# Patient Record
Sex: Female | Born: 1960 | Race: Black or African American | Hispanic: No | Marital: Single | Smoking: Never smoker
Health system: Southern US, Community
[De-identification: ages and names within clinical notes are randomized; demographics above are authoritative.]

## PROBLEM LIST (undated history)

## (undated) DIAGNOSIS — IMO0001 Reserved for inherently not codable concepts without codable children: Secondary | ICD-10-CM

## (undated) DIAGNOSIS — K219 Gastro-esophageal reflux disease without esophagitis: Secondary | ICD-10-CM

## (undated) DIAGNOSIS — D509 Iron deficiency anemia, unspecified: Secondary | ICD-10-CM

## (undated) DIAGNOSIS — Z8551 Personal history of malignant neoplasm of bladder: Secondary | ICD-10-CM

## (undated) DIAGNOSIS — K635 Polyp of colon: Secondary | ICD-10-CM

## (undated) DIAGNOSIS — R7303 Prediabetes: Secondary | ICD-10-CM

## (undated) DIAGNOSIS — M1990 Unspecified osteoarthritis, unspecified site: Secondary | ICD-10-CM

## (undated) DIAGNOSIS — E7800 Pure hypercholesterolemia, unspecified: Secondary | ICD-10-CM

## (undated) DIAGNOSIS — Z972 Presence of dental prosthetic device (complete) (partial): Secondary | ICD-10-CM

## (undated) DIAGNOSIS — E66811 Obesity, class 1: Secondary | ICD-10-CM

## (undated) DIAGNOSIS — R5383 Other fatigue: Secondary | ICD-10-CM

## (undated) DIAGNOSIS — C50919 Malignant neoplasm of unspecified site of unspecified female breast: Secondary | ICD-10-CM

## (undated) DIAGNOSIS — R5381 Other malaise: Secondary | ICD-10-CM

## (undated) DIAGNOSIS — R945 Abnormal results of liver function studies: Secondary | ICD-10-CM

## (undated) DIAGNOSIS — E876 Hypokalemia: Secondary | ICD-10-CM

## (undated) DIAGNOSIS — F1011 Alcohol abuse, in remission: Secondary | ICD-10-CM

## (undated) DIAGNOSIS — M5430 Sciatica, unspecified side: Secondary | ICD-10-CM

## (undated) DIAGNOSIS — C801 Malignant (primary) neoplasm, unspecified: Secondary | ICD-10-CM

## (undated) DIAGNOSIS — I10 Essential (primary) hypertension: Secondary | ICD-10-CM

## (undated) DIAGNOSIS — M109 Gout, unspecified: Secondary | ICD-10-CM

## (undated) HISTORY — DX: Essential (primary) hypertension: I10

## (undated) HISTORY — DX: Hypokalemia: E87.6

## (undated) HISTORY — DX: Abnormal results of liver function studies: R94.5

## (undated) HISTORY — PX: VAGINAL HYSTERECTOMY: SUR661

## (undated) HISTORY — DX: Other fatigue: R53.83

## (undated) HISTORY — DX: Other malaise: R53.81

## (undated) HISTORY — DX: Pure hypercholesterolemia, unspecified: E78.00

## (undated) HISTORY — DX: Hypercalcemia: E83.52

## (undated) HISTORY — PX: ENDOMETRIAL ABLATION: SHX621

## (undated) HISTORY — DX: Iron deficiency anemia, unspecified: D50.9

## (undated) HISTORY — DX: Gout, unspecified: M10.9

---

## 2003-07-21 ENCOUNTER — Other Ambulatory Visit: Payer: Self-pay

## 2003-08-24 DIAGNOSIS — D509 Iron deficiency anemia, unspecified: Secondary | ICD-10-CM

## 2003-08-24 DIAGNOSIS — I10 Essential (primary) hypertension: Secondary | ICD-10-CM

## 2003-08-24 HISTORY — DX: Essential (primary) hypertension: I10

## 2003-08-24 HISTORY — DX: Iron deficiency anemia, unspecified: D50.9

## 2005-11-16 ENCOUNTER — Ambulatory Visit: Payer: Self-pay | Admitting: Obstetrics and Gynecology

## 2008-02-05 ENCOUNTER — Other Ambulatory Visit: Payer: Self-pay

## 2008-02-05 ENCOUNTER — Ambulatory Visit: Payer: Self-pay | Admitting: Unknown Physician Specialty

## 2008-02-22 ENCOUNTER — Inpatient Hospital Stay: Payer: Self-pay | Admitting: Unknown Physician Specialty

## 2008-02-24 ENCOUNTER — Other Ambulatory Visit: Payer: Self-pay

## 2009-04-29 DIAGNOSIS — E7800 Pure hypercholesterolemia, unspecified: Secondary | ICD-10-CM

## 2009-04-29 HISTORY — DX: Pure hypercholesterolemia, unspecified: E78.00

## 2011-01-21 ENCOUNTER — Ambulatory Visit: Payer: Self-pay | Admitting: General Practice

## 2011-02-01 ENCOUNTER — Ambulatory Visit: Payer: Self-pay | Admitting: Urology

## 2014-11-26 ENCOUNTER — Ambulatory Visit
Admit: 2014-11-26 | Disposition: A | Discharge status: Home / Self Care | Payer: Self-pay | Attending: Gastroenterology | Admitting: Gastroenterology

## 2014-11-26 ENCOUNTER — Observation Stay
Admit: 2014-11-26 | Disposition: A | Discharge status: Home / Self Care | Payer: Self-pay | Attending: Gastroenterology | Admitting: Gastroenterology

## 2014-11-26 LAB — CBC WITH DIFFERENTIAL/PLATELET
Basophil #: 0 10*3/uL (ref 0.0–0.1)
Basophil %: 0.8 %
Eosinophil #: 0.2 10*3/uL (ref 0.0–0.7)
Eosinophil %: 2.9 %
HCT: 37 % (ref 35.0–47.0)
HGB: 12.2 g/dL (ref 12.0–16.0)
LYMPHS PCT: 32.7 %
Lymphocyte #: 1.9 10*3/uL (ref 1.0–3.6)
MCH: 29.5 pg (ref 26.0–34.0)
MCHC: 33 g/dL (ref 32.0–36.0)
MCV: 90 fL (ref 80–100)
Monocyte #: 0.4 x10 3/mm (ref 0.2–0.9)
Monocyte %: 7 %
NEUTROS ABS: 3.3 10*3/uL (ref 1.4–6.5)
NEUTROS PCT: 56.6 %
Platelet: 290 10*3/uL (ref 150–440)
RBC: 4.14 10*6/uL (ref 3.80–5.20)
RDW: 13.4 % (ref 11.5–14.5)
WBC: 5.9 10*3/uL (ref 3.6–11.0)

## 2014-11-26 LAB — BASIC METABOLIC PANEL
Anion Gap: 7 (ref 7–16)
BUN: 13 mg/dL
CHLORIDE: 105 mmol/L
CO2: 29 mmol/L
CREATININE: 0.75 mg/dL
Calcium, Total: 9.3 mg/dL
EGFR (African American): >60
EGFR (Non-African Amer.): >60
GLUCOSE: 99 mg/dL
Potassium: 3.5 mmol/L
Sodium: 141 mmol/L

## 2014-11-26 LAB — PROTIME-INR
INR: 1.1
PROTHROMBIN TIME: 14.3 s

## 2014-11-26 LAB — APTT: Activated PTT: 30.1 secs (ref 23.6–35.9)

## 2014-11-27 LAB — BASIC METABOLIC PANEL
Anion Gap: 10 (ref 7–16)
BUN: 9 mg/dL
CO2: 27 mmol/L
Calcium, Total: 9.5 mg/dL
Chloride: 105 mmol/L
Creatinine: 0.75 mg/dL
EGFR (African American): >60
EGFR (Non-African Amer.): >60
GLUCOSE: 100 mg/dL — AB
Potassium: 3 mmol/L — ABNORMAL LOW
Sodium: 142 mmol/L

## 2014-11-27 LAB — CBC WITH DIFFERENTIAL/PLATELET
Basophil #: 0 10*3/uL (ref 0.0–0.1)
Basophil %: 0.4 %
EOS ABS: 0.1 10*3/uL (ref 0.0–0.7)
Eosinophil %: 1.3 %
HCT: 34 % — ABNORMAL LOW (ref 35.0–47.0)
HGB: 11.3 g/dL — ABNORMAL LOW (ref 12.0–16.0)
Lymphocyte #: 1.6 10*3/uL (ref 1.0–3.6)
Lymphocyte %: 20.5 %
MCH: 29.5 pg (ref 26.0–34.0)
MCHC: 33.3 g/dL (ref 32.0–36.0)
MCV: 89 fL (ref 80–100)
MONO ABS: 0.5 x10 3/mm (ref 0.2–0.9)
Monocyte %: 6.4 %
NEUTROS ABS: 5.4 10*3/uL (ref 1.4–6.5)
Neutrophil %: 71.4 %
Platelet: 252 10*3/uL (ref 150–440)
RBC: 3.84 10*6/uL (ref 3.80–5.20)
RDW: 13.2 % (ref 11.5–14.5)
WBC: 7.6 10*3/uL (ref 3.6–11.0)

## 2014-12-16 LAB — SURGICAL PATHOLOGY

## 2014-12-22 NOTE — Consult Note (Signed)
Chief Complaint:  Subjective/Chief Complaint Pt has no further bleeding or abdominal pain.  c/o sore scratchy throat.   VITAL SIGNS/ANCILLARY NOTES: **Vital Signs.:   06-Apr-16 08:00  Vital Signs Type Q 8hr  Celsius 36.7  Temperature Source oral  Pulse Pulse 80  Respirations Respirations 20  Systolic BP Systolic BP 891  Diastolic BP (mmHg) Diastolic BP (mmHg) 87  Mean BP 102  Pulse Ox % Pulse Ox % 94  Pulse Ox Activity Level  At rest  Oxygen Delivery Room Air/ 21 %   Brief Assessment:  GEN well developed, well nourished, no acute distress, A/Ox3, Friend at bedside   Cardiac Regular   Respiratory normal resp effort   Gastrointestinal Normal   Gastrointestinal details normal Soft  Nontender  Nondistended  No masses palpable  Bowel sounds normal  No rebound tenderness  No gaurding  No rigidity  No organomegaly   EXTR negative cyanosis/clubbing, negative edema   Additional Physical Exam Skin: warm, dry, intact   Lab Results:  Routine Chem:  06-Apr-16 05:48   Glucose, Serum  100 (65-99 NOTE: New Reference Range  10/29/14)  BUN 9 (6-20 NOTE: New Reference Range  10/29/14)  Creatinine (comp) 0.75 (0.44-1.00 NOTE: New Reference Range  10/29/14)  Potassium, Serum  3.0 (3.5-5.1 NOTE: New Reference Range  10/29/14)  Chloride, Serum 105 (101-111 NOTE: New Reference Range  10/29/14)  CO2, Serum 27 (22-32 NOTE: New Reference Range  10/29/14)  Calcium (Total), Serum 9.5 (8.9-10.3 NOTE: New Reference Range  10/29/14)  Anion Gap 10  eGFR (African American) >60  eGFR (Non-African American) >60 (eGFR values <47m/min/1.73 m2 may be an indication of chronic kidney disease (CKD). Calculated eGFR is useful in patients with stable renal function. The eGFR calculation will not be reliable in acutely ill patients when serum creatinine is changing rapidly. It is not useful in patients on dialysis. The eGFR calculation may not be applicable to patients at the low and high  extremes of body sizes, pregnant women, and vegetarians.)  Routine Hem:  06-Apr-16 05:48   WBC (CBC) 7.6  RBC (CBC) 3.84  Hemoglobin (CBC)  11.3  Hematocrit (CBC)  34.0  Platelet Count (CBC) 252  MCV 89  MCH 29.5  MCHC 33.3  RDW 13.2  Neutrophil % 71.4  Lymphocyte % 20.5  Monocyte % 6.4  Eosinophil % 1.3  Basophil % 0.4  Neutrophil # 5.4  Lymphocyte # 1.6  Monocyte # 0.5  Eosinophil # 0.1  Basophil # 0.0 (Result(s) reported on 27 Nov 2014 at 06:33AM.)   Assessment/Plan:  Assessment/Plan:  Assessment Postpolypectomy Bleed:  Resolved s/p clip placement. Anemia: Stable/.  Secondary to GI bleed.   Plan 1) Chloraseptic as needed 2) Colace 1032mQD or BID 3) clear liquids today 4) low residue diet for 4 days 5) call if further bleeding Please call if you have any questions or concerns   Electronic Signatures: JoAndria MeuseNP)  (Signed 06-Apr-16 14:09)  Authored: Chief Complaint, VITAL SIGNS/ANCILLARY NOTES, Brief Assessment, Lab Results, Assessment/Plan   Last Updated: 06-Apr-16 14:09 by JoAndria MeuseNP)

## 2014-12-22 NOTE — H&P (Signed)
See alternate H&P dictated for this date  Maksym Pfiffner Fawn Kirk MD ELECTRONICALLY SIGNED 12/07/2014 20:35

## 2014-12-22 NOTE — H&P (Signed)
PATIENT NAME:  Christina, Holloway MR#:  [id number] DATE OF BIRTH:  [birth year]  DATE OF ADMISSION:  11/26/2014  ADDENDUM  DATE OF BIRTH: 02/28/1961.   CHIEF COMPLAINT: Gastrointestinal bleed.   HISTORY OF PRESENT ILLNESS: This is a 54 year old female who, earlier on the day of admission had a colonoscopy done and had a large polyp removed by Dr. Allen Norris. Dr. Allen Norris states that there was no bleeding during the initial procedure. The patient went home, was doing well. The patient returned later with significant multiple bloody bowel movements. The patient admits that she did not adhere strictly to a clear liquid diet. Dr. Allen Norris happened to be present doing some training, and so he elected to take the patient back to the endoscopy lab. He was able to find the site of the bleed and achieved good hemostasis with multiple clips. He then contacted the hospitalists for admission for observation to make sure the patient did not re-bleed and that her hemoglobin was stable.   PRIMARY CARE PHYSICIAN: Margarita Rana, MD.   PAST MEDICAL HISTORY: Hypertension.   CURRENT MEDICATIONS: Hydrochlorothiazide 12.5 mg daily, atenolol 25 mg daily.   PAST SURGICAL HISTORY: Total hysterectomy and a recent colonoscopy as mentioned in history of present illness.   ALLERGIES: PENICILLIN CAUSES HIVES.   FAMILY HISTORY: Includes hypertension.   SOCIAL HISTORY: The patient is not a smoker. Denies alcohol and denies illicit drug use.   REVIEW OF SYSTEMS  CONSTITUTIONAL: Denies fever, fatigue, or weakness.  EYES: Denies double or blurred vision, redness or pain.  EAR, NOSE, AND THROAT: Denies hearing loss, difficulty swallowing, ear pain.  RESPIRATORY: Denies dyspnea, painful breathing, chronic obstructive pulmonary disease.  CARDIOVASCULAR: Denies orthopnea, chest pain and palpitations.  GASTROINTESTINAL: Endorses bloody bowel movements. Denies nausea or vomiting. Denies constipation.  GENITOURINARY: Denies hematuria, frequency, or  incontinence.  ENDOCRINE: Denies nocturia, thyroid problems, thirst.  HEMATOLOGIC AND LYMPHATIC: Denies easy bruising, bleeding, swollen glands.  INTEGUMENTARY: Denies acne, rash or lesions.  MUSCULOSKELETAL: Denies acute arthritis, joint swelling, or gout.  NEUROLOGICAL: Denies weakness, dysphagia, headache.  PSYCHIATRIC: Denies anxiety, insomnia, or depression.   PHYSICAL EXAMINATION:  VITAL SIGNS: Blood pressure 161/90 pulse 79, respirations 20 with 95% O2 sats on room air. Temperature 98.1.  GENERAL: This is an obese female, who is sitting up in bed postprocedure in no apparent distress.  HEENT: Pupils equal, round and reactive to light and accommodation. Extraocular movements intact. No scleral icterus. No hearing loss. Moist mucosal membranes.  NECK: Thyroid is not enlarged. Neck is supple. No masses, nontender. No cervical adenopathy. No JVD noted.  RESPIRATORY: Lungs are clear to auscultation bilaterally with no rales, rhonchi or wheezes and no respiratory distress.  CARDIOVASCULAR: Shows a regular rate and rhythm with no murmurs, rubs, or gallops. She has good pedal pulses with no lower extremity edema.  ABDOMEN: Soft, mild tenderness in the epigastric area, nondistended with good bowel sounds.  MUSCULOSKELETAL: Muscular strength 5/5 signs all 4 extremities. Full spontaneous range of motion throughout. No cyanosis or clubbing.  SKIN: No rash or lesions. Skin is warm, dry, and intact.  LYMPHATIC: No adenopathy.  NEUROLOGICAL: Cranial nerves are intact. Sensation intact throughout. No dysarthria or aphasia.  PSYCHIATRIC: Alert and oriented x3. Cooperative with good insight.   LABORATORY DATA: White count 5.9, hemoglobin 12.2, hematocrit 37.0, platelets 290,000, sodium 141, potassium 3.5, chloride 105, bicarbonate 29, BUN 13, creatinine 0.75, glucose 99, calcium is 9.3, INR 1.1.   RADIOLOGY: Upright KUB shows no gross evidence of free air.  ASSESSMENT AND PLAN:  1. Gastrointestinal  bleed. This is a problem that occurred most likely due to irritation of the polyp resection site. Per Dr. Verl Blalock, was a large polyp removal for endoscopy and not adhering to a clear liquid diet very likely irritated the site and precipitated the bleed. Good hemostasis was achieved on repeat endoscopy with clipping per Dr. Verl Blalock. We will admit the patient observation and monitor overnight, recheck her hemoglobin in the morning, make sure she has no further bleeding with gastroenterology following along. We will place her on pantoprazole. 2. Hypertension. This is a chronic problem the patient has. Her blood pressure is a little bit elevated tonight. We will restart her home medications and see how her blood pressure does with that regimen. We can use intravenous p.r.n. if we need to or increase her dose of her home meds as needed to control her blood pressure. 3. Deep vein thrombosis prophylaxis with sequential compression devices only, as the patient just had a gastrointestinal bleed.   CODE STATUS: This patient is full code.   TIME SPENT ON THIS ADMISSION: 50 minutes.    ____________________________ Wilford Corner. Jannifer Franklin, MD dfw:AT D: 11/27/2014 12:45:40 ET T: 11/27/2014 12:57:27 ET JOB#: [id number]  cc: Wilford Corner. Jannifer Franklin, MD, <Dictator> Liesel Peckenpaugh Fawn Kirk MD ELECTRONICALLY SIGNED 11/27/2014 23:40

## 2014-12-22 NOTE — Discharge Summary (Signed)
PATIENT NAME:  Christina Holloway, Christina Holloway MR#:  [id number] DATE OF BIRTH:  25-Apr-1961  DATE OF ADMISSION:  11/26/2014 DATE OF DISCHARGE:  11/27/2014  ADMITTING DIAGNOSES: Gastrointestinal bleed.   DISCHARGE DIAGNOSES:  1.  Hematochezia after colonoscopy and polyp removal on 11/26/2014 by Dr. Allen Norris.  2.  Acute posthemorrhagic anemia.  3.  Hyperglycemia.  4.  History of hypertension and alcohol abuse.   DISCHARGE CONDITION: Stable.   DISCHARGE MEDICATIONS: The patient is to continue atenolol 25 mg daily, HCTZ 12.5 mg p.o. daily, new medication omeprazole 40 mg p.o. daily.   HOME OXYGEN: None.   DIET: Two gram salt, Ensure 3 times daily, nectar thick liquids.   ACTIVITY LIMITATIONS: As tolerated.   FOLLOWUP APPOINTMENTS: With Dr. Allen Norris in 1 week after discharge, Dr. Venia Minks in 2-3 days after discharge.  CONSULTANTS: Care management, social work.   RADIOLOGIC STUDIES: KUB, 11/26/2014, showing technically degraded radiograph. No gross plain film evidence of free air.   HOSPITAL COURSE: The patient is a 54 year old African American female with past medical history significant for history of hypertension, history of alcohol abuse, who presented to the hospital with hematochezia after colonoscopy. Apparently, the patient underwent colonoscopy and had removal of 20 mm polyp in the cecum as well as a 4 mm polyp from ascending colon. After returning back home, she had at least 3 large bowel movements with bright red blood as well as black or tarry looking stool, so she presented to the hospital for further evaluation and underwent repeated colonoscopy on 11/26/2014 by Dr. Allen Norris who saw blood rectum as well as sigmoid colon and descending colon as well as the splenic flexure and in transverse colon. He placed clips and the patient was admitted for observation. The patient was initiated on clear liquid diet. Her bleeding actually resolved. She was advised to continue to full liquid diet while she was at home for the  next 3 or 4 days and advance slowly to soft diet. She is to follow up with Dr. Allen Norris in the next few days after discharge. The patient's hemoglobin level was followed while she was in the hospital and she was noted to have hemoglobin level drifting down from 12.2 on the day of admission to 11.3 on the day of discharge. Since the patient had no more recurrent bleeding and her platelet count remained stable as well as her pro time and INR were normal, it was felt that the patient would be okay to be discharged. Her vital signs were stable on the day of discharge, temperature 98.2, pulse was 80s, respirations were 20, blood pressure 133/87, saturation was 94%-95% on room air at rest. The patient was also noted to have hypokalemia with a potassium level of 3.0 on 11/27/2014; she was supplemented potassium. It is recommended to follow the patient's hemoglobin level as outpatient and make decisions about iron supplementation if needed. For now, the patient is to continue omeprazole to decrease the risks of rebleeding.   TIME SPENT: 40 minutes.     ____________________________ Theodoro Grist, MD rv:bm D: 11/27/2014 16:49:00 ET T: 11/28/2014 00:27:19 ET JOB#: [id number]  cc: Theodoro Grist, MD, <Dictator> Jerrell Belfast, MD Lucilla Lame, MD [place] MD ELECTRONICALLY SIGNED 12/01/2014 16:15

## 2015-02-20 ENCOUNTER — Telehealth: Payer: Self-pay

## 2015-02-27 NOTE — Telephone Encounter (Signed)
LVM for pt to return my call to schedule 4 month colonoscopy repeat.

## 2015-05-02 ENCOUNTER — Other Ambulatory Visit: Payer: Self-pay

## 2015-05-02 DIAGNOSIS — M109 Gout, unspecified: Secondary | ICD-10-CM

## 2015-05-02 DIAGNOSIS — D649 Anemia, unspecified: Secondary | ICD-10-CM | POA: Insufficient documentation

## 2015-05-02 DIAGNOSIS — R5383 Other fatigue: Secondary | ICD-10-CM

## 2015-05-02 DIAGNOSIS — R7989 Other specified abnormal findings of blood chemistry: Secondary | ICD-10-CM | POA: Insufficient documentation

## 2015-05-02 DIAGNOSIS — E876 Hypokalemia: Secondary | ICD-10-CM

## 2015-05-02 DIAGNOSIS — R945 Abnormal results of liver function studies: Secondary | ICD-10-CM | POA: Insufficient documentation

## 2015-05-02 DIAGNOSIS — R5381 Other malaise: Secondary | ICD-10-CM | POA: Insufficient documentation

## 2015-05-02 HISTORY — DX: Gout, unspecified: M10.9

## 2015-05-02 HISTORY — DX: Other specified abnormal findings of blood chemistry: R79.89

## 2015-05-02 HISTORY — DX: Other malaise: R53.81

## 2015-05-02 HISTORY — DX: Hypercalcemia: E83.52

## 2015-05-02 HISTORY — DX: Hypokalemia: E87.6

## 2015-05-02 HISTORY — DX: Other malaise: R53.83

## 2015-05-02 HISTORY — DX: Abnormal results of liver function studies: R94.5

## 2015-05-05 ENCOUNTER — Ambulatory Visit: Payer: Self-pay | Admitting: Gastroenterology

## 2015-05-05 ENCOUNTER — Encounter (INDEPENDENT_AMBULATORY_CARE_PROVIDER_SITE_OTHER): Payer: Self-pay

## 2015-05-05 ENCOUNTER — Ambulatory Visit (INDEPENDENT_AMBULATORY_CARE_PROVIDER_SITE_OTHER): Payer: PRIVATE HEALTH INSURANCE | Admitting: Gastroenterology

## 2015-05-05 ENCOUNTER — Encounter: Payer: Self-pay | Admitting: *Deleted

## 2015-05-05 ENCOUNTER — Other Ambulatory Visit: Payer: Self-pay

## 2015-05-05 VITALS — BP 146/91 | HR 59 | Temp 98.6°F | Ht 67.0 in | Wt 236.0 lb

## 2015-05-05 DIAGNOSIS — R1013 Epigastric pain: Secondary | ICD-10-CM

## 2015-05-05 DIAGNOSIS — G8929 Other chronic pain: Secondary | ICD-10-CM

## 2015-05-05 DIAGNOSIS — Z86010 Personal history of colonic polyps: Secondary | ICD-10-CM

## 2015-05-05 MED ORDER — PEG 3350-KCL-NA BICARB-NACL 420 G PO SOLR
4000.0000 mL | ORAL | Status: DC
Start: 2015-05-05 — End: 2017-02-07

## 2015-05-05 NOTE — Progress Notes (Signed)
   Primary Care Physician: Margarita Rana, MD  Primary Gastroenterologist:  Dr. Lucilla Lame  Chief Complaint  Patient presents with  . Abdominal Pain    HPI: Christina Holloway is a 54 y.o. female here who had a large polyp removed from her cecal area with a post-polypectomy bleed. The patient was supposed to have a repeat colonoscopy but has not had one since. She comes in with a report of epigastric pain that lasted 1 day a few weeks ago. The patient has had no repeat in that abdominal pain. The patient also states that she had just finished a large milkshake when she had the pain.  Current Outpatient Prescriptions  Medication Sig Dispense Refill  . atenolol (TENORMIN) 25 MG tablet Take by mouth.    . hydrochlorothiazide (HYDRODIURIL) 12.5 MG tablet Take by mouth.    Marland Kitchen omeprazole (PRILOSEC) 40 MG capsule Take by mouth.     No current facility-administered medications for this visit.    Allergies as of 05/05/2015 - Review Complete 05/05/2015  Allergen Reaction Noted  . Penicillins  05/02/2015    ROS:  General: Negative for anorexia, weight loss, fever, chills, fatigue, weakness. ENT: Negative for hoarseness, difficulty swallowing , nasal congestion. CV: Negative for chest pain, angina, palpitations, dyspnea on exertion, peripheral edema.  Respiratory: Negative for dyspnea at rest, dyspnea on exertion, cough, sputum, wheezing.  GI: See history of present illness. GU:  Negative for dysuria, hematuria, urinary incontinence, urinary frequency, nocturnal urination.  Endo: Negative for unusual weight change.    Physical Examination:   BP 146/91 mmHg  Pulse 59  Temp(Src) 98.6 F (37 C) (Oral)  Ht 5\' 7"  (1.702 m)  Wt 236 lb (107.049 kg)  BMI 36.95 kg/m2  General: Well-nourished, well-developed in no acute distress.  Eyes: No icterus. Conjunctivae pink. Mouth: Oropharyngeal mucosa moist and pink , no lesions erythema or exudate. Lungs: Clear to auscultation bilaterally.  Non-labored. Heart: Regular rate and rhythm, no murmurs rubs or gallops.  Abdomen: Bowel sounds are normal, nontender, nondistended, no hepatosplenomegaly or masses, no abdominal bruits or hernia , no rebound or guarding.   Extremities: No lower extremity edema. No clubbing or deformities. Neuro: Alert and oriented x 3.  Grossly intact. Skin: Warm and dry, no jaundice.   Psych: Alert and cooperative, normal mood and affect.  Labs:    Imaging Studies: No results found.  Assessment and Plan:   Christina Holloway is a 54 y.o. y/o female who has a history of a large polyp in the colon and will be set up for repeat colonoscopy. The patient also has a recent history of epigastric pain that lasted one day. The patient has not had any further episodes of that. The patient has been told that she will be set up for repeat colonoscopy due to her polyp and will have an upper endoscopy if she has any further episodes of the pain. The patient has been explained the plan and agrees with it.   Note: This dictation was prepared with Dragon dictation along with smaller phrase technology. Any transcriptional errors that result from this process are unintentional.

## 2015-05-07 NOTE — Discharge Instructions (Signed)

## 2015-05-08 ENCOUNTER — Ambulatory Visit: Payer: PRIVATE HEALTH INSURANCE | Admitting: Student in an Organized Health Care Education/Training Program

## 2015-05-08 ENCOUNTER — Other Ambulatory Visit: Payer: Self-pay | Admitting: Gastroenterology

## 2015-05-08 ENCOUNTER — Ambulatory Visit
Admission: RE | Admit: 2015-05-08 | Discharge: 2015-05-08 | Disposition: A | Discharge status: Home / Self Care | Payer: PRIVATE HEALTH INSURANCE | Source: Ambulatory Visit | Attending: Gastroenterology | Admitting: Gastroenterology

## 2015-05-08 ENCOUNTER — Encounter
Admission: RE | Disposition: A | Discharge status: Home / Self Care | Payer: Self-pay | Source: Ambulatory Visit | Attending: Gastroenterology

## 2015-05-08 ENCOUNTER — Encounter: Payer: Self-pay | Admitting: *Deleted

## 2015-05-08 DIAGNOSIS — Z860100 Personal history of colon polyps, unspecified: Secondary | ICD-10-CM | POA: Insufficient documentation

## 2015-05-08 DIAGNOSIS — K219 Gastro-esophageal reflux disease without esophagitis: Secondary | ICD-10-CM | POA: Diagnosis not present

## 2015-05-08 DIAGNOSIS — M1990 Unspecified osteoarthritis, unspecified site: Secondary | ICD-10-CM | POA: Insufficient documentation

## 2015-05-08 DIAGNOSIS — Z91010 Allergy to peanuts: Secondary | ICD-10-CM | POA: Diagnosis not present

## 2015-05-08 DIAGNOSIS — E780 Pure hypercholesterolemia: Secondary | ICD-10-CM | POA: Insufficient documentation

## 2015-05-08 DIAGNOSIS — Z09 Encounter for follow-up examination after completed treatment for conditions other than malignant neoplasm: Secondary | ICD-10-CM | POA: Diagnosis not present

## 2015-05-08 DIAGNOSIS — Z79899 Other long term (current) drug therapy: Secondary | ICD-10-CM | POA: Diagnosis not present

## 2015-05-08 DIAGNOSIS — I10 Essential (primary) hypertension: Secondary | ICD-10-CM | POA: Insufficient documentation

## 2015-05-08 HISTORY — DX: Reserved for inherently not codable concepts without codable children: IMO0001

## 2015-05-08 HISTORY — DX: Gastro-esophageal reflux disease without esophagitis: K21.9

## 2015-05-08 HISTORY — PX: COLONOSCOPY WITH PROPOFOL: SHX5780

## 2015-05-08 HISTORY — DX: Presence of dental prosthetic device (complete) (partial): Z97.2

## 2015-05-08 HISTORY — DX: Unspecified osteoarthritis, unspecified site: M19.90

## 2015-05-08 HISTORY — PX: POLYPECTOMY: SHX5525

## 2015-05-08 SURGERY — COLONOSCOPY WITH PROPOFOL
Anesthesia: Monitor Anesthesia Care | Wound class: Contaminated

## 2015-05-08 MED ORDER — PROPOFOL 10 MG/ML IV BOLUS
INTRAVENOUS | Status: DC | PRN
  Administered 2015-05-08: 50 mg via INTRAVENOUS
  Administered 2015-05-08: 150 mg via INTRAVENOUS
  Administered 2015-05-08: 50 mg via INTRAVENOUS
  Administered 2015-05-08: 20 mg via INTRAVENOUS
  Administered 2015-05-08: 50 mg via INTRAVENOUS
  Administered 2015-05-08: 30 mg via INTRAVENOUS

## 2015-05-08 MED ORDER — STERILE WATER FOR IRRIGATION IR SOLN
Status: DC | PRN
  Administered 2015-05-08: 10:00:00

## 2015-05-08 MED ORDER — ONDANSETRON HCL 4 MG/2ML IJ SOLN: 4.0000 mg | Freq: Once | INTRAMUSCULAR | Status: DC | PRN

## 2015-05-08 MED ORDER — LIDOCAINE HCL (CARDIAC) 20 MG/ML IV SOLN
INTRAVENOUS | Status: DC | PRN
  Administered 2015-05-08: 50 mg via INTRAVENOUS

## 2015-05-08 MED ORDER — LACTATED RINGERS IV SOLN
INTRAVENOUS | Status: DC
  Administered 2015-05-08 (×2): via INTRAVENOUS

## 2015-05-08 MED ORDER — ACETAMINOPHEN 160 MG/5ML PO SOLN: 325.0000 mg | ORAL | Status: DC | PRN

## 2015-05-08 MED ORDER — ACETAMINOPHEN 325 MG PO TABS
325.0000 mg | ORAL_TABLET | ORAL | Status: DC | PRN
Start: 2015-05-08 — End: 2015-05-08

## 2015-05-08 SURGICAL SUPPLY — 28 items
CANISTER SUCT 1200ML W/VALVE (MISCELLANEOUS) ×4 IMPLANT
FCP ESCP3.2XJMB 240X2.8X (MISCELLANEOUS)
FORCEPS BIOP RAD 4 LRG CAP 4 (CUTTING FORCEPS) IMPLANT
FORCEPS BIOP RJ4 240 W/NDL (MISCELLANEOUS)
FORCEPS ESCP3.2XJMB 240X2.8X (MISCELLANEOUS) IMPLANT
GOWN CVR UNV OPN BCK APRN NK (MISCELLANEOUS) ×4 IMPLANT
GOWN ISOL THUMB LOOP REG UNIV (MISCELLANEOUS) ×4
HEMOCLIP INSTINCT (CLIP) ×4 IMPLANT
INJECTOR VARIJECT VIN23 (MISCELLANEOUS) IMPLANT
KIT CO2 TUBING (TUBING) IMPLANT
KIT DEFENDO VALVE AND CONN (KITS) IMPLANT
KIT ENDO PROCEDURE OLY (KITS) ×4 IMPLANT
LIGATOR MULTIBAND 6SHOOTER MBL (MISCELLANEOUS) IMPLANT
MARKER SPOT ENDO TATTOO 5ML (MISCELLANEOUS) IMPLANT
PAD GROUND ADULT SPLIT (MISCELLANEOUS) IMPLANT
SNARE SHORT THROW 13M SML OVAL (MISCELLANEOUS) ×4 IMPLANT
SNARE SHORT THROW 30M LRG OVAL (MISCELLANEOUS) IMPLANT
SPOT EX ENDOSCOPIC TATTOO (MISCELLANEOUS)
SUCTION POLY TRAP 4CHAMBER (MISCELLANEOUS) IMPLANT
TRAP SUCTION POLY (MISCELLANEOUS) ×4 IMPLANT
TUBING CONN 6MMX3.1M (TUBING)
TUBING SUCTION CONN 0.25 STRL (TUBING) IMPLANT
UNDERPAD 30X60 958B10 (PK) (MISCELLANEOUS) IMPLANT
VALVE BIOPSY ENDO (VALVE) IMPLANT
VARIJECT INJECTOR VIN23 (MISCELLANEOUS)
WATER AUXILLARY (MISCELLANEOUS) IMPLANT
WATER STERILE IRR 250ML POUR (IV SOLUTION) ×4 IMPLANT
WATER STERILE IRR 500ML POUR (IV SOLUTION) IMPLANT

## 2015-05-08 NOTE — Anesthesia Postprocedure Evaluation (Signed)
  Anesthesia Post-op Note  Patient: Christina Holloway  Procedure(s) Performed: Procedure(s): COLONOSCOPY WITH PROPOFOL (N/A) POLYPECTOMY  Anesthesia type:MAC  Patient location: PACU  Post pain: Pain level controlled  Post assessment: Post-op Vital signs reviewed, Patient's Cardiovascular Status Stable, Respiratory Function Stable, Patent Airway and No signs of Nausea or vomiting  Post vital signs: Reviewed and stable  Last Vitals:  Filed Vitals:   05/08/15 1006  BP: 126/89  Pulse: 80  Temp:   Resp: 18    Level of consciousness: awake, alert  and patient cooperative  Complications: No apparent anesthesia complications

## 2015-05-08 NOTE — Op Note (Signed)
[facility] Gastroenterology Patient Name: Christina Holloway Procedure Date: 05/08/2015 9:35 AM MRN: [id number] Account #: [id number] Date of Birth: 04/18/1961 Admit Type: Outpatient Age: 54 Room: [facility] OR ROOM 01 Gender: Female Note Status: Finalized Procedure:         Colonoscopy Indications:       Follow-up for history of adenomatous polyps in the colon Providers:         Lucilla Lame, MD Referring MD:      Jerrell Belfast, MD (Referring MD) Medicines:         Propofol per Anesthesia Complications:     No immediate complications. Procedure:         Pre-Anesthesia Assessment:                    - Prior to the procedure, a History and Physical was                     performed, and patient medications and allergies were                     reviewed. The patient's tolerance of previous anesthesia                     was also reviewed. The risks and benefits of the procedure                     and the sedation options and risks were discussed with the                     patient. All questions were answered, and informed consent                     was obtained. Prior Anticoagulants: The patient has taken                     no previous anticoagulant or antiplatelet agents. ASA                     Grade Assessment: II - A patient with mild systemic                     disease. After reviewing the risks and benefits, the                     patient was deemed in satisfactory condition to undergo                     the procedure.                    After obtaining informed consent, the colonoscope was                     passed under direct vision. Throughout the procedure, the                     patient's blood pressure, pulse, and oxygen saturations                     were monitored continuously. The Olympus CF H180AL                     colonoscope (S#: [id number]) was introduced through the anus  and advanced to the the cecum, identified by  appendiceal                     orifice and ileocecal valve. The colonoscopy was performed                     without difficulty. The patient tolerated the procedure                     well. The quality of the bowel preparation was excellent. Findings:      The perianal and digital rectal examinations were normal.      Polyppectomy site seen in the ascending colon with the clip in place.       The clip was removed and the area aroun was cauterized. A new clip was       placed to prevent bleeding. Impression:        - Polyppectomy site seen in the ascending colon with the                     clip in place. The clip was removed and the area aroun was                     cauterized. A new clip was placed to prevent bleeding.                    - No specimens collected. Recommendation:    - Repeat colonoscopy in 3 years for surveillance. Procedure Code(s): --- Professional ---                    [phone], Colonoscopy, flexible; diagnostic, including                     collection of specimen(s) by brushing or washing, when                     performed (separate procedure) Diagnosis Code(s): --- Professional ---                    Z86.010, Personal history of colonic polyps CPT copyright 2014 [employer]. All rights reserved. The codes documented in this report are preliminary and upon coder review may  be revised to meet current compliance requirements. Lucilla Lame, MD 05/08/2015 9:57:27 AM This report has been signed electronically. Number of Addenda: 0 Note Initiated On: 05/08/2015 9:35 AM Scope Withdrawal Time: 0 hours 8 minutes 37 seconds  Total Procedure Duration: 0 hours 11 minutes 48 seconds       [facility]

## 2015-05-08 NOTE — Anesthesia Procedure Notes (Signed)
Procedure Name: MAC Performed by: Nickolai Rinks Pre-anesthesia Checklist: Patient identified, Emergency Drugs available, Suction available, Timeout performed and Patient being monitored Patient Re-evaluated:Patient Re-evaluated prior to inductionOxygen Delivery Method: Nasal cannula Placement Confirmation: positive ETCO2     

## 2015-05-08 NOTE — Anesthesia Preprocedure Evaluation (Signed)
Anesthesia Evaluation  Patient identified by MRN, date of birth, ID band Patient awake    Reviewed: Allergy & Precautions, NPO status , Patient's Chart, lab work & pertinent test results  Airway Mallampati: II  TM Distance: >3 FB Neck ROM: Full    Dental  (+) Upper Dentures, Partial Lower   Pulmonary shortness of breath,    Pulmonary exam normal        Cardiovascular hypertension, Normal cardiovascular exam     Neuro/Psych    GI/Hepatic GERD  ,  Endo/Other    Renal/GU      Musculoskeletal  (+) Arthritis ,   Abdominal   Peds  Hematology   Anesthesia Other Findings   Reproductive/Obstetrics                             Anesthesia Physical Anesthesia Plan  ASA: II  Anesthesia Plan: MAC   Post-op Pain Management:    Induction: Intravenous  Airway Management Planned: Simple Face Mask  Additional Equipment:   Intra-op Plan:   Post-operative Plan:   Informed Consent: I have reviewed the patients History and Physical, chart, labs and discussed the procedure including the risks, benefits and alternatives for the proposed anesthesia with the patient or authorized representative who has indicated his/her understanding and acceptance.     Plan Discussed with: CRNA  Anesthesia Plan Comments:         Anesthesia Quick Evaluation

## 2015-05-08 NOTE — Transfer of Care (Signed)
Immediate Anesthesia Transfer of Care Note  Patient: Christina Holloway  Procedure(s) Performed: Procedure(s): COLONOSCOPY WITH PROPOFOL (N/A) POLYPECTOMY  Patient Location: PACU  Anesthesia Type: MAC  Level of Consciousness: awake, alert  and patient cooperative  Airway and Oxygen Therapy: Patient Spontanous Breathing and Patient connected to supplemental oxygen  Post-op Assessment: Post-op Vital signs reviewed, Patient's Cardiovascular Status Stable, Respiratory Function Stable, Patent Airway and No signs of Nausea or vomiting  Post-op Vital Signs: Reviewed and stable  Complications: No apparent anesthesia complications

## 2015-05-08 NOTE — H&P (Signed)
  [facility]  [address], [place], [place] [address] Phone: [phone] Fax : [phone]  Primary Care Physician:  Margarita Rana, MD Primary Gastroenterologist:  Dr. Allen Norris  Pre-Procedure History & Physical: HPI:  Christina Holloway is a 54 y.o. female is here for an colonoscopy.   Past Medical History  Diagnosis Date  . Essential (primary) hypertension 08/24/2003  . Abnormal LFTs 05/02/2015  . Gout 05/02/2015  . Calcium blood increased 05/02/2015  . Decreased potassium in the blood 05/02/2015  . Malaise and fatigue 05/02/2015  . Hypercholesterolemia without hypertriglyceridemia 04/29/2009  . Anemia, iron deficiency 08/24/2003    in past  . GERD (gastroesophageal reflux disease)   . Wears dentures     full upper, partial lower  . Arthritis     lower back, legs  . Shortness of breath dyspnea     1 flight stairs    Past Surgical History  Procedure Laterality Date  . Vaginal hysterectomy      Prior to Admission medications   Medication Sig Start Date End Date Taking? Authorizing Sayvion Vigen  atenolol (TENORMIN) 25 MG tablet Take by mouth. 10/24/14  Yes Historical Dayan Kreis, MD  hydrochlorothiazide (HYDRODIURIL) 12.5 MG tablet Take by mouth. 11/19/14  Yes Historical Johnnetta Holstine, MD  omeprazole (PRILOSEC) 40 MG capsule Take by mouth.    Historical Zacaria Pousson, MD  polyethylene glycol-electrolytes (TRILYTE) 420 G solution Take 4,000 mLs by mouth as directed. Drink one 8 oz glass every 30 mins until stools run clear. Patient not taking: Reported on 05/08/2015 05/05/15   Lucilla Lame, MD    Allergies as of 05/05/2015 - Review Complete 05/05/2015  Allergen Reaction Noted  . Penicillins  05/02/2015    Family History  Problem Relation Age of Onset  . Hypertension Mother   . Prostate cancer Father     Social History   Social History  . Marital Status: Single    Spouse Name: N/A  . Number of Children: N/A  . Years of Education: N/A   Occupational History  . Not on file.    Social History Main Topics  . Smoking status: Never Smoker   . Smokeless tobacco: Never Used  . Alcohol Use: 21.6 oz/week    36 Cans of beer per week     Comment: pt says 6-7 beers most days  . Drug Use: No  . Sexual Activity: Not on file   Other Topics Concern  . Not on file   Social History Narrative    Review of Systems: See HPI, otherwise negative ROS  Physical Exam: BP 173/95 mmHg  Pulse 48  Temp(Src) 98.8 F (37.1 C) (Temporal)  Resp 18  Ht 5\' 7"  (1.702 m)  Wt 233 lb (105.688 kg)  BMI 36.48 kg/m2  SpO2 98% General:   Alert,  pleasant and cooperative in NAD Head:  Normocephalic and atraumatic. Neck:  Supple; no masses or thyromegaly. Lungs:  Clear throughout to auscultation.    Heart:  Regular rate and rhythm. Abdomen:  Soft, nontender and nondistended. Normal bowel sounds, without guarding, and without rebound.   Neurologic:  Alert and  oriented x4;  grossly normal neurologically.  Impression/Plan: Christina Holloway is here for an colonoscopy to be performed for follow up of a polyp removal.  Risks, benefits, limitations, and alternatives regarding  colonoscopy have been reviewed with the patient.  Questions have been answered.  All parties agreeable.   Ollen Bowl, MD  05/08/2015, 9:30 AM

## 2015-05-09 ENCOUNTER — Encounter: Payer: Self-pay | Admitting: Gastroenterology

## 2015-05-12 ENCOUNTER — Encounter: Payer: Self-pay | Admitting: Gastroenterology

## 2015-06-17 ENCOUNTER — Other Ambulatory Visit: Payer: Self-pay | Admitting: Family Medicine

## 2015-06-17 DIAGNOSIS — I10 Essential (primary) hypertension: Secondary | ICD-10-CM

## 2015-06-17 NOTE — Telephone Encounter (Signed)
Last OV 11/29/2014. Renaldo Fiddler, CMA

## 2015-07-29 ENCOUNTER — Other Ambulatory Visit: Payer: Self-pay | Admitting: Family Medicine

## 2015-07-29 DIAGNOSIS — I10 Essential (primary) hypertension: Secondary | ICD-10-CM

## 2015-10-24 ENCOUNTER — Other Ambulatory Visit: Payer: Self-pay | Admitting: Family Medicine

## 2015-10-24 DIAGNOSIS — I10 Essential (primary) hypertension: Secondary | ICD-10-CM

## 2015-10-24 NOTE — Telephone Encounter (Signed)
LOV 11/29/2014.

## 2015-12-08 ENCOUNTER — Ambulatory Visit (INDEPENDENT_AMBULATORY_CARE_PROVIDER_SITE_OTHER): Payer: Managed Care, Other (non HMO) | Admitting: Family Medicine

## 2015-12-08 ENCOUNTER — Encounter: Payer: Self-pay | Admitting: Family Medicine

## 2015-12-08 VITALS — BP 148/86 | HR 60 | Temp 98.1°F | Resp 16 | Wt 239.0 lb

## 2015-12-08 DIAGNOSIS — M10071 Idiopathic gout, right ankle and foot: Secondary | ICD-10-CM

## 2015-12-08 DIAGNOSIS — M25561 Pain in right knee: Secondary | ICD-10-CM | POA: Diagnosis not present

## 2015-12-08 DIAGNOSIS — I10 Essential (primary) hypertension: Secondary | ICD-10-CM | POA: Diagnosis not present

## 2015-12-08 DIAGNOSIS — J40 Bronchitis, not specified as acute or chronic: Secondary | ICD-10-CM | POA: Diagnosis not present

## 2015-12-08 MED ORDER — AZITHROMYCIN 250 MG PO TABS: ORAL_TABLET | ORAL | Status: DC

## 2015-12-08 MED ORDER — LOSARTAN POTASSIUM 50 MG PO TABS
50.0000 mg | ORAL_TABLET | Freq: Every day | ORAL | Status: DC
Start: 2015-12-08 — End: 2017-01-05

## 2015-12-08 NOTE — Progress Notes (Signed)
Patient ID: Christina Holloway, female   DOB: 1960-12-23, 55 y.o.   MRN: [id number]        Patient: Christina Holloway Female    DOB: 14-Mar-1961   55 y.o.   MRN: [id number] Visit Date: 12/08/2015  Today's Provider: Margarita Rana, MD   Chief Complaint  Patient presents with  . URI  . Knee Pain   Subjective:    URI  This is a new problem. The current episode started more than 1 month ago. The problem has been gradually worsening. There has been no fever. Associated symptoms include coughing, joint pain and wheezing. Pertinent negatives include no chest pain, congestion, diarrhea, ear pain, headaches, nausea, neck pain, plugged ear sensation, rhinorrhea, sinus pain, sneezing, sore throat, swollen glands or vomiting. Treatments tried: OTC Cough medications. The treatment provided no relief.  Knee Pain  The incident occurred more than 1 week ago. There was no injury mechanism. The pain is present in the right knee. The quality of the pain is described as aching (Also "pops"). The pain is at a severity of 3/10. The pain has been constant since onset. Pertinent negatives include no inability to bear weight, loss of motion, loss of sensation, muscle weakness, numbness or tingling. Nothing aggravates the symptoms. She has tried elevation and ice for the symptoms. The treatment provided no relief.       Allergies  Allergen Reactions  . Penicillins    Previous Medications   ATENOLOL (TENORMIN) 25 MG TABLET    TAKE 1 TABLET BY MOUTH EVERY DAY.   HYDROCHLOROTHIAZIDE (HYDRODIURIL) 12.5 MG TABLET    Take by mouth.   HYDROCHLOROTHIAZIDE (MICROZIDE) 12.5 MG CAPSULE    TAKE 1 CAPSULE BY MOUTH DAILY   POLYETHYLENE GLYCOL-ELECTROLYTES (TRILYTE) 420 G SOLUTION    Take 4,000 mLs by mouth as directed. Drink one 8 oz glass every 30 mins until stools run clear.    Review of Systems  Constitutional: Negative for activity change and appetite change.  HENT: Negative for congestion, dental problem, ear pain, mouth sores,  nosebleeds, postnasal drip, rhinorrhea, sinus pressure, sneezing, sore throat, tinnitus, trouble swallowing and voice change.   Eyes: Positive for discharge. Negative for photophobia, pain, redness, itching and visual disturbance.  Respiratory: Positive for cough, chest tightness, shortness of breath and wheezing. Negative for apnea and choking.   Cardiovascular: Negative for chest pain, palpitations and leg swelling.  Gastrointestinal: Negative.  Negative for nausea, vomiting and diarrhea.  Musculoskeletal: Positive for back pain, joint pain, joint swelling and arthralgias. Negative for myalgias, gait problem, neck pain and neck stiffness.  Neurological: Negative for dizziness, tingling, light-headedness, numbness and headaches.    Social History  Substance Use Topics  . Smoking status: Never Smoker   . Smokeless tobacco: Never Used  . Alcohol Use: 21.6 oz/week    36 Cans of beer per week     Comment: pt says 6-7 beers most days   Objective:   BP 148/86 mmHg  Pulse 60  Temp(Src) 98.1 F (36.7 C) (Oral)  Resp 16  Wt 239 lb (108.41 kg)  SpO2 99%  Physical Exam  Constitutional: She is oriented to person, place, and time. She appears well-developed and well-nourished.  HENT:  Head: Normocephalic and atraumatic.  Right Ear: Tympanic membrane, external ear and ear canal normal.  Left Ear: Tympanic membrane, external ear and ear canal normal.  Nose: Mucosal edema present.  Mouth/Throat: Uvula is midline, oropharynx is clear and moist and mucous membranes are normal.  Eyes: Lids  are everted and swept, no foreign bodies found.  Cardiovascular: Normal rate and regular rhythm.   Pulmonary/Chest: Effort normal and breath sounds normal.  Musculoskeletal: Normal range of motion. She exhibits edema (Right knee swollen).  Neurological: She is alert and oriented to person, place, and time.  Skin: Skin is warm and dry.  Psychiatric: She has a normal mood and affect. Her behavior is normal.  Judgment and thought content normal.      Assessment & Plan:     1. Right knee pain With swelling and does lock at times. Will refer.   - Ambulatory referral to Orthopedic Surgery  2. Essential (primary) hypertension Not at goal and in light of gout will change HCTZ to Cozaar as is glucosuric and recheck labs and bp at follow up.  - losartan (COZAAR) 50 MG tablet; Take 1 tablet (50 mg total) by mouth daily.  Dispense: 90 tablet; Refill: 3 - CBC with Differential/Platelet - Comprehensive metabolic panel - Uric acid  3. Idiopathic gout of right foot, unspecified chronicity Recurrent. Will  Check labs and change medication as above.  - CBC with Differential/Platelet - Comprehensive metabolic panel - Uric acid  4. Bronchitis New problem. Worsening. Patient instructed to call back if condition worsens or does not improve.    - azithromycin (ZITHROMAX) 250 MG tablet; Take two tablets for one day and decrease to one a day unit gone.  Dispense: 6 tablet; Refill: 0     Patient was seen and examined by Jerrell Belfast, MD, and note scribed by Ashley Royalty, CMA. I have reviewed the document for accuracy and completeness and I agree with above. - Jerrell Belfast, MD  Margarita Rana, MD  [facility]

## 2015-12-09 ENCOUNTER — Telehealth: Payer: Self-pay

## 2015-12-09 LAB — CBC WITH DIFFERENTIAL/PLATELET
BASOS: 1 %
Basophils Absolute: 0.1 10*3/uL (ref 0.0–0.2)
EOS (ABSOLUTE): 0.2 10*3/uL (ref 0.0–0.4)
Eos: 4 %
Hematocrit: 38.5 % (ref 34.0–46.6)
Hemoglobin: 13.2 g/dL (ref 11.1–15.9)
IMMATURE GRANS (ABS): 0 10*3/uL (ref 0.0–0.1)
Immature Granulocytes: 0 %
LYMPHS: 36 %
Lymphocytes Absolute: 2.3 10*3/uL (ref 0.7–3.1)
MCH: 30.1 pg (ref 26.6–33.0)
MCHC: 34.3 g/dL (ref 31.5–35.7)
MCV: 88 fL (ref 79–97)
MONOS ABS: 0.4 10*3/uL (ref 0.1–0.9)
Monocytes: 7 %
NEUTROS PCT: 52 %
Neutrophils Absolute: 3.2 10*3/uL (ref 1.4–7.0)
PLATELETS: 316 10*3/uL (ref 150–379)
RBC: 4.39 x10E6/uL (ref 3.77–5.28)
RDW: 14.2 % (ref 12.3–15.4)
WBC: 6.2 10*3/uL (ref 3.4–10.8)

## 2015-12-09 LAB — COMPREHENSIVE METABOLIC PANEL
A/G RATIO: 1.6 (ref 1.2–2.2)
ALT: 43 IU/L — AB (ref 0–32)
AST: 26 IU/L (ref 0–40)
Albumin: 4.5 g/dL (ref 3.5–5.5)
Alkaline Phosphatase: 84 IU/L (ref 39–117)
BILIRUBIN TOTAL: 0.3 mg/dL (ref 0.0–1.2)
BUN/Creatinine Ratio: 19 (ref 9–23)
BUN: 16 mg/dL (ref 6–24)
CHLORIDE: 98 mmol/L (ref 96–106)
CO2: 24 mmol/L (ref 18–29)
Calcium: 10.2 mg/dL (ref 8.7–10.2)
Creatinine, Ser: 0.86 mg/dL (ref 0.57–1.00)
GFR calc non Af Amer: 77 mL/min/{1.73_m2} (ref >59)
GFR, EST AFRICAN AMERICAN: 89 mL/min/{1.73_m2} (ref >59)
Globulin, Total: 2.8 g/dL (ref 1.5–4.5)
Glucose: 87 mg/dL (ref 65–99)
POTASSIUM: 4.4 mmol/L (ref 3.5–5.2)
Sodium: 141 mmol/L (ref 134–144)
TOTAL PROTEIN: 7.3 g/dL (ref 6.0–8.5)

## 2015-12-09 LAB — URIC ACID: Uric Acid: 9 mg/dL — ABNORMAL HIGH (ref 2.5–7.1)

## 2015-12-09 NOTE — Telephone Encounter (Signed)
-----  Message from Margarita Rana, MD sent at 12/09/2015  9:20 AM EDT ----- Labs as suspected.  High uric acid. Recheck met c and uric acid in 4 weeks after med changes. Thanks.

## 2015-12-09 NOTE — Telephone Encounter (Signed)
LMTCB 12/09/2015  Thanks,   -Mickel Baas

## 2015-12-15 NOTE — Telephone Encounter (Signed)
LMTCB 12/15/2015  Thanks,   -Mickel Baas

## 2015-12-16 NOTE — Telephone Encounter (Signed)
Advised patient as below.  

## 2016-01-12 ENCOUNTER — Other Ambulatory Visit: Payer: Self-pay | Admitting: Family Medicine

## 2016-01-12 DIAGNOSIS — I10 Essential (primary) hypertension: Secondary | ICD-10-CM

## 2016-02-12 ENCOUNTER — Encounter: Payer: Managed Care, Other (non HMO) | Admitting: Family Medicine

## 2016-08-03 ENCOUNTER — Other Ambulatory Visit: Payer: Self-pay | Admitting: Family Medicine

## 2016-08-03 DIAGNOSIS — I10 Essential (primary) hypertension: Secondary | ICD-10-CM

## 2016-08-03 NOTE — Telephone Encounter (Signed)
LOV with Dr. Venia Minks 12/08/2015. Does not have FU scheduled. Renaldo Fiddler, CMA

## 2017-01-05 ENCOUNTER — Other Ambulatory Visit: Payer: Self-pay | Admitting: Physician Assistant

## 2017-01-05 DIAGNOSIS — I10 Essential (primary) hypertension: Secondary | ICD-10-CM

## 2017-01-05 MED ORDER — LOSARTAN POTASSIUM 50 MG PO TABS: 50.0000 mg | ORAL_TABLET | Freq: Every day | ORAL | 0 refills | Status: DC

## 2017-01-05 NOTE — Telephone Encounter (Signed)
Patient hasn't been seen 12/08/15.

## 2017-01-05 NOTE — Telephone Encounter (Signed)
[facility] faxed a request for the following medication. Thanks CC  losartan (COZAAR) 50 MG tablet  Take one tablet by mouth daily.

## 2017-02-07 ENCOUNTER — Encounter: Payer: Self-pay | Admitting: Physician Assistant

## 2017-02-07 ENCOUNTER — Encounter (INDEPENDENT_AMBULATORY_CARE_PROVIDER_SITE_OTHER): Payer: Self-pay

## 2017-02-07 ENCOUNTER — Ambulatory Visit: Payer: Self-pay | Admitting: Physician Assistant

## 2017-02-07 VITALS — BP 145/89 | HR 53 | Temp 98.5°F | Ht 68.0 in | Wt 216.0 lb

## 2017-02-07 DIAGNOSIS — Z0000 Encounter for general adult medical examination without abnormal findings: Secondary | ICD-10-CM

## 2017-02-07 DIAGNOSIS — Z008 Encounter for other general examination: Secondary | ICD-10-CM

## 2017-02-07 DIAGNOSIS — Z0189 Encounter for other specified special examinations: Secondary | ICD-10-CM

## 2017-02-07 MED ORDER — LOSARTAN POTASSIUM-HCTZ 50-12.5 MG PO TABS
1.0000 | ORAL_TABLET | Freq: Every day | ORAL | 3 refills | Status: DC
Start: 2017-02-07 — End: 2018-01-05

## 2017-02-07 NOTE — Progress Notes (Signed)
S: pt here for wellness physical and biometrics for insurance purposes, no complaints ros neg. Pt states she would like to go back on the hctz because she is holding fluid a lot; PMH: htn, colon polyps   Social: nonsmoker, 6 beers a week, no drugs Fam: htn, prosate cancer, thyroid disease, and one sister died of aneurysm  O: vitals wnl, nad, ENT wnl, neck supple no lymph, lungs c t a, cv rrr, abd soft nontender bs normal all 4 quads  A: wellness, biometric physical  P: ekg, mammogram ordered, labs, refill on losartan/hctz

## 2017-02-08 LAB — CMP12+LP+TP+TSH+6AC+CBC/D/PLT
A/G RATIO: 2.1 (ref 1.2–2.2)
ALK PHOS: 82 IU/L (ref 39–117)
ALT: 26 IU/L (ref 0–32)
AST: 26 IU/L (ref 0–40)
Albumin: 4.4 g/dL (ref 3.5–5.5)
BASOS ABS: 0.1 10*3/uL (ref 0.0–0.2)
BILIRUBIN TOTAL: 0.3 mg/dL (ref 0.0–1.2)
BUN / CREAT RATIO: 13 (ref 9–23)
BUN: 12 mg/dL (ref 6–24)
Basos: 1 %
CHOL/HDL RATIO: 5.1 ratio — AB (ref 0.0–4.4)
CREATININE: 0.92 mg/dL (ref 0.57–1.00)
Calcium: 10.4 mg/dL — ABNORMAL HIGH (ref 8.7–10.2)
Chloride: 104 mmol/L (ref 96–106)
Cholesterol, Total: 208 mg/dL — ABNORMAL HIGH (ref 100–199)
EOS (ABSOLUTE): 0.2 10*3/uL (ref 0.0–0.4)
EOS: 3 %
Estimated CHD Risk: 1.3 times avg. — ABNORMAL HIGH (ref 0.0–1.0)
Free Thyroxine Index: 1.5 (ref 1.2–4.9)
GFR, EST AFRICAN AMERICAN: 82 mL/min/{1.73_m2} (ref >59)
GFR, EST NON AFRICAN AMERICAN: 71 mL/min/{1.73_m2} (ref >59)
GGT: 54 IU/L (ref 0–60)
GLUCOSE: 86 mg/dL (ref 65–99)
Globulin, Total: 2.1 g/dL (ref 1.5–4.5)
HDL: 41 mg/dL (ref >39)
HEMOGLOBIN: 13.8 g/dL (ref 11.1–15.9)
Hematocrit: 42 % (ref 34.0–46.6)
IMMATURE GRANS (ABS): 0 10*3/uL (ref 0.0–0.1)
Immature Granulocytes: 0 %
Iron: 97 ug/dL (ref 27–159)
LDH: 310 IU/L — AB (ref 119–226)
LDL CALC: 115 mg/dL — AB (ref 0–99)
LYMPHS: 40 %
Lymphocytes Absolute: 2.2 10*3/uL (ref 0.7–3.1)
MCH: 30.1 pg (ref 26.6–33.0)
MCHC: 32.9 g/dL (ref 31.5–35.7)
MCV: 92 fL (ref 79–97)
MONOCYTES: 6 %
Monocytes Absolute: 0.3 10*3/uL (ref 0.1–0.9)
NEUTROS ABS: 2.7 10*3/uL (ref 1.4–7.0)
Neutrophils: 50 %
PHOSPHORUS: 4 mg/dL (ref 2.5–4.5)
PLATELETS: 360 10*3/uL (ref 150–379)
Potassium: 4.4 mmol/L (ref 3.5–5.2)
RBC: 4.58 x10E6/uL (ref 3.77–5.28)
RDW: 14 % (ref 12.3–15.4)
Sodium: 144 mmol/L (ref 134–144)
T3 Uptake Ratio: 28 % (ref 24–39)
T4, Total: 5.3 ug/dL (ref 4.5–12.0)
TSH: 1.23 u[IU]/mL (ref 0.450–4.500)
Total Protein: 6.5 g/dL (ref 6.0–8.5)
Triglycerides: 258 mg/dL — ABNORMAL HIGH (ref 0–149)
URIC ACID: 7.9 mg/dL — AB (ref 2.5–7.1)
VLDL CHOLESTEROL CAL: 52 mg/dL — AB (ref 5–40)
WBC: 5.4 10*3/uL (ref 3.4–10.8)

## 2017-02-08 LAB — HIV ANTIBODY (ROUTINE TESTING W REFLEX): HIV Screen 4th Generation wRfx: NONREACTIVE

## 2017-02-08 LAB — VITAMIN D 25 HYDROXY (VIT D DEFICIENCY, FRACTURES): Vit D, 25-Hydroxy: 17 ng/mL — ABNORMAL LOW (ref 30.0–100.0)

## 2017-02-08 LAB — HEPATITIS C ANTIBODY (REFLEX): HCV Ab: <0.1 s/co ratio (ref 0.0–0.9)

## 2017-02-08 LAB — HCV COMMENT:

## 2017-02-08 MED ORDER — VITAMIN D (ERGOCALCIFEROL) 1.25 MG (50000 UNIT) PO CAPS: 50000.0000 [IU] | ORAL_CAPSULE | ORAL | 3 refills | Status: DC

## 2017-02-08 NOTE — Addendum Note (Signed)
Addended by: Versie Starks on: 02/08/2017 09:43 AM   Modules accepted: Orders

## 2017-03-21 ENCOUNTER — Other Ambulatory Visit: Payer: Self-pay | Admitting: Physician Assistant

## 2017-03-21 DIAGNOSIS — I10 Essential (primary) hypertension: Secondary | ICD-10-CM

## 2017-04-28 ENCOUNTER — Other Ambulatory Visit: Payer: Self-pay | Admitting: Physician Assistant

## 2017-04-28 DIAGNOSIS — I10 Essential (primary) hypertension: Secondary | ICD-10-CM

## 2017-12-24 ENCOUNTER — Other Ambulatory Visit: Payer: Self-pay | Admitting: Physician Assistant

## 2017-12-24 DIAGNOSIS — I10 Essential (primary) hypertension: Secondary | ICD-10-CM

## 2018-01-04 ENCOUNTER — Telehealth: Payer: Self-pay | Admitting: Family Medicine

## 2018-01-04 NOTE — Telephone Encounter (Signed)
Made an appt

## 2018-01-05 ENCOUNTER — Encounter: Payer: Self-pay | Admitting: Family Medicine

## 2018-01-05 ENCOUNTER — Ambulatory Visit (INDEPENDENT_AMBULATORY_CARE_PROVIDER_SITE_OTHER): Payer: Managed Care, Other (non HMO) | Admitting: Family Medicine

## 2018-01-05 VITALS — BP 176/104 | HR 67 | Temp 98.2°F | Resp 16

## 2018-01-05 DIAGNOSIS — I10 Essential (primary) hypertension: Secondary | ICD-10-CM | POA: Diagnosis not present

## 2018-01-05 DIAGNOSIS — Z0000 Encounter for general adult medical examination without abnormal findings: Secondary | ICD-10-CM | POA: Diagnosis not present

## 2018-01-05 MED ORDER — LOSARTAN POTASSIUM-HCTZ 50-12.5 MG PO TABS: 1.0000 | ORAL_TABLET | Freq: Every day | ORAL | 11 refills | Status: DC

## 2018-01-05 MED ORDER — ATENOLOL 25 MG PO TABS: 25.0000 mg | ORAL_TABLET | Freq: Every day | ORAL | 11 refills | Status: DC

## 2018-01-05 NOTE — Assessment & Plan Note (Signed)
Uncontrolled No red flag symptoms today Has been out of her medications for about 1 week Resume atenolol, losartan, HCTZ at previous doses Patient to monitor blood pressure at work and let me know if it is continuously greater than 140/90 Follow-up in 3 months and consider dose titration Check CMP today Will need other labs at follow-up visit

## 2018-01-05 NOTE — Patient Instructions (Signed)
DASH Eating Plan DASH stands for "Dietary Approaches to Stop Hypertension." The DASH eating plan is a healthy eating plan that has been shown to reduce high blood pressure (hypertension). It may also reduce your risk for type 2 diabetes, heart disease, and stroke. The DASH eating plan may also help with weight loss. What are tips for following this plan? General guidelines  Avoid eating more than 2,300 mg (milligrams) of salt (sodium) a day. If you have hypertension, you may need to reduce your sodium intake to 1,500 mg a day.  Limit alcohol intake to no more than 1 drink a day for nonpregnant women and 2 drinks a day for men. One drink equals 12 oz of beer, 5 oz of wine, or 1 oz of hard liquor.  Work with your health care provider to maintain a healthy body weight or to lose weight. Ask what an ideal weight is for you.  Get at least 30 minutes of exercise that causes your heart to beat faster (aerobic exercise) most days of the week. Activities may include walking, swimming, or biking.  Work with your health care provider or diet and nutrition specialist (dietitian) to adjust your eating plan to your individual calorie needs. Reading food labels  Check food labels for the amount of sodium per serving. Choose foods with less than 5 percent of the Daily Value of sodium. Generally, foods with less than 300 mg of sodium per serving fit into this eating plan.  To find whole grains, look for the word "whole" as the first word in the ingredient list. Shopping  Buy products labeled as "low-sodium" or "no salt added."  Buy fresh foods. Avoid canned foods and premade or frozen meals. Cooking  Avoid adding salt when cooking. Use salt-free seasonings or herbs instead of table salt or sea salt. Check with your health care provider or pharmacist before using salt substitutes.  Do not fry foods. Cook foods using healthy methods such as baking, boiling, grilling, and broiling instead.  Cook with  heart-healthy oils, such as olive, canola, soybean, or sunflower oil. Meal planning   Eat a balanced diet that includes: ? 5 or more servings of fruits and vegetables each day. At each meal, try to fill half of your plate with fruits and vegetables. ? Up to 6-8 servings of whole grains each day. ? Less than 6 oz of lean meat, poultry, or fish each day. A 3-oz serving of meat is about the same size as a deck of cards. One egg equals 1 oz. ? 2 servings of low-fat dairy each day. ? A serving of nuts, seeds, or beans 5 times each week. ? Heart-healthy fats. Healthy fats called Omega-3 fatty acids are found in foods such as flaxseeds and coldwater fish, like sardines, salmon, and mackerel.  Limit how much you eat of the following: ? Canned or prepackaged foods. ? Food that is high in trans fat, such as fried foods. ? Food that is high in saturated fat, such as fatty meat. ? Sweets, desserts, sugary drinks, and other foods with added sugar. ? Full-fat dairy products.  Do not salt foods before eating.  Try to eat at least 2 vegetarian meals each week.  Eat more home-cooked food and less restaurant, buffet, and fast food.  When eating at a restaurant, ask that your food be prepared with less salt or no salt, if possible. What foods are recommended? The items listed may not be a complete list. Talk with your dietitian about what   dietary choices are best for you. Grains Whole-grain or whole-wheat bread. Whole-grain or whole-wheat pasta. Brown rice. Oatmeal. Quinoa. Bulgur. Whole-grain and low-sodium cereals. Pita bread. Low-fat, low-sodium crackers. Whole-wheat flour tortillas. Vegetables Fresh or frozen vegetables (raw, steamed, roasted, or grilled). Low-sodium or reduced-sodium tomato and vegetable juice. Low-sodium or reduced-sodium tomato sauce and tomato paste. Low-sodium or reduced-sodium canned vegetables. Fruits All fresh, dried, or frozen fruit. Canned fruit in natural juice (without  added sugar). Meat and other protein foods Skinless chicken or turkey. Ground chicken or turkey. Pork with fat trimmed off. Fish and seafood. Egg whites. Dried beans, peas, or lentils. Unsalted nuts, nut butters, and seeds. Unsalted canned beans. Lean cuts of beef with fat trimmed off. Low-sodium, lean deli meat. Dairy Low-fat (1%) or fat-free (skim) milk. Fat-free, low-fat, or reduced-fat cheeses. Nonfat, low-sodium ricotta or cottage cheese. Low-fat or nonfat yogurt. Low-fat, low-sodium cheese. Fats and oils Soft margarine without trans fats. Vegetable oil. Low-fat, reduced-fat, or light mayonnaise and salad dressings (reduced-sodium). Canola, safflower, olive, soybean, and sunflower oils. Avocado. Seasoning and other foods Herbs. Spices. Seasoning mixes without salt. Unsalted popcorn and pretzels. Fat-free sweets. What foods are not recommended? The items listed may not be a complete list. Talk with your dietitian about what dietary choices are best for you. Grains Baked goods made with fat, such as croissants, muffins, or some breads. Dry pasta or rice meal packs. Vegetables Creamed or fried vegetables. Vegetables in a cheese sauce. Regular canned vegetables (not low-sodium or reduced-sodium). Regular canned tomato sauce and paste (not low-sodium or reduced-sodium). Regular tomato and vegetable juice (not low-sodium or reduced-sodium). Pickles. Olives. Fruits Canned fruit in a light or heavy syrup. Fried fruit. Fruit in cream or butter sauce. Meat and other protein foods Fatty cuts of meat. Ribs. Fried meat. Bacon. Sausage. Bologna and other processed lunch meats. Salami. Fatback. Hotdogs. Bratwurst. Salted nuts and seeds. Canned beans with added salt. Canned or smoked fish. Whole eggs or egg yolks. Chicken or turkey with skin. Dairy Whole or 2% milk, cream, and half-and-half. Whole or full-fat cream cheese. Whole-fat or sweetened yogurt. Full-fat cheese. Nondairy creamers. Whipped toppings.  Processed cheese and cheese spreads. Fats and oils Butter. Stick margarine. Lard. Shortening. Ghee. Bacon fat. Tropical oils, such as coconut, palm kernel, or palm oil. Seasoning and other foods Salted popcorn and pretzels. Onion salt, garlic salt, seasoned salt, table salt, and sea salt. Worcestershire sauce. Tartar sauce. Barbecue sauce. Teriyaki sauce. Soy sauce, including reduced-sodium. Steak sauce. Canned and packaged gravies. Fish sauce. Oyster sauce. Cocktail sauce. Horseradish that you find on the shelf. Ketchup. Mustard. Meat flavorings and tenderizers. Bouillon cubes. Hot sauce and Tabasco sauce. Premade or packaged marinades. Premade or packaged taco seasonings. Relishes. Regular salad dressings. Where to find more information:  National Heart, Lung, and Blood Institute: www.nhlbi.nih.gov  American Heart Association: www.heart.org Summary  The DASH eating plan is a healthy eating plan that has been shown to reduce high blood pressure (hypertension). It may also reduce your risk for type 2 diabetes, heart disease, and stroke.  With the DASH eating plan, you should limit salt (sodium) intake to 2,300 mg a day. If you have hypertension, you may need to reduce your sodium intake to 1,500 mg a day.  When on the DASH eating plan, aim to eat more fresh fruits and vegetables, whole grains, lean proteins, low-fat dairy, and heart-healthy fats.  Work with your health care provider or diet and nutrition specialist (dietitian) to adjust your eating plan to your individual   calorie needs. This information is not intended to replace advice given to you by your health care provider. Make sure you discuss any questions you have with your health care provider. Document Released: 07/29/2011 Document Revised: 08/02/2016 Document Reviewed: 08/02/2016 Elsevier Interactive Patient Education  2018 Elsevier Inc.  

## 2018-01-05 NOTE — Progress Notes (Signed)
Patient: Christina Holloway Female    DOB: Oct 15, 1960   57 y.o.   MRN: [id number] Visit Date: 01/05/2018  Today's Provider: Lavon Paganini, Holloway   I, Christina Holloway, CMA, am acting as scribe for Christina Holloway.  Chief Complaint  Patient presents with  . Hypertension   Subjective:    HPI      Hypertension, follow-up:  BP Readings from Last 3 Encounters:  01/05/18 (!) 176/104  02/07/17 (!) 145/89  12/08/15 (!) 148/86    She was last seen for hypertension 11 months ago by Christina Cordia, PA-C at [facility] for wellness, HTN FU, etc.  BP at that visit was 145/89. Management since that visit includes changing losartan 50 mg and HCTZ 12.5 mg to to the combination medication. She reports poor compliance with treatment. She has ran out of all of her medications. She is not having side effects.  She is not exercising. She is not adherent to low salt diet.   Outside blood pressures are not being checked. She is experiencing headache, and some SOB with exertion.  Patient denies chest pain, chest pressure/discomfort, claudication, dyspnea, exertional chest pressure/discomfort, fatigue, irregular heart beat, lower extremity edema, near-syncope, orthopnea, palpitations and syncope.   Cardiovascular risk factors include hypertension and obesity (BMI >= 30 kg/m2).  Use of agents associated with hypertension: none.  Current diet: in general, an "unhealthy" diet  ------------------------------------------------------------------------   Allergies  Allergen Reactions  . Penicillins      Current Outpatient Medications:  .  atenolol (TENORMIN) 25 MG tablet, TAKE 1 TABLET BY MOUTH EVERY DAY (Patient not taking: Reported on 01/05/2018), Disp: 30 tablet, Rfl: 5 .  losartan-hydrochlorothiazide (HYZAAR) 50-12.5 MG tablet, Take 1 tablet by mouth daily. (Patient not taking: Reported on 01/05/2018), Disp: 90 tablet, Rfl: 3 .  Vitamin D, Ergocalciferol, (DRISDOL) 50000 units CAPS  capsule, Take 1 capsule (50,000 Units total) by mouth every 7 (seven) days. (Patient not taking: Reported on 01/05/2018), Disp: 12 capsule, Rfl: 3  Review of Systems  Constitutional: Negative for activity change, appetite change, chills, diaphoresis, fatigue, fever and unexpected weight change.  Respiratory: Negative for shortness of breath.   Cardiovascular: Negative for chest pain, palpitations and leg swelling.  Neurological: Positive for headaches.    Social History   Tobacco Use  . Smoking status: Never Smoker  . Smokeless tobacco: Never Used  Substance Use Topics  . Alcohol use: Yes    Alcohol/week: 21.6 oz    Types: 36 Cans of beer per week    Comment: pt says 6-7 beers most days   Objective:   BP (!) 176/104 (BP Location: Left Arm, Patient Position: Sitting, Cuff Size: Large)   Pulse 67   Temp 98.2 F (36.8 C) (Oral)   Resp 16   SpO2 97%  Vitals:   01/05/18 1439  BP: (!) 176/104  Pulse: 67  Resp: 16  Temp: 98.2 F (36.8 C)  TempSrc: Oral  SpO2: 97%     Physical Exam  Constitutional: She is oriented to person, place, and time. She appears well-developed and well-nourished. No distress.  HENT:  Head: Normocephalic and atraumatic.  Right Ear: External ear normal.  Left Ear: External ear normal.  Nose: Nose normal.  Mouth/Throat: Oropharynx is clear and moist.  Eyes: Pupils are equal, round, and reactive to light. Conjunctivae and EOM are normal. No scleral icterus.  Neck: Neck supple. No thyromegaly present.  Cardiovascular: Normal rate, regular rhythm, normal heart sounds and intact  distal pulses.  No murmur heard. Pulmonary/Chest: Effort normal and breath sounds normal. No respiratory distress. She has no wheezes. She has no rales.  Musculoskeletal: She exhibits no edema or deformity.  Lymphadenopathy:    She has no cervical adenopathy.  Neurological: She is alert and oriented to person, place, and time.  Skin: Skin is warm and dry. Capillary refill takes  less than 2 seconds. No rash noted.  Psychiatric: She has a normal mood and affect. Her behavior is normal.  Vitals reviewed.      Assessment & Plan:   Problem List Items Addressed This Visit      Cardiovascular and Mediastinum   Essential (primary) hypertension - Primary    Uncontrolled No red flag symptoms today Has been out of her medications for about 1 week Resume atenolol, losartan, HCTZ at previous doses Patient to monitor blood pressure at work and let me know if it is continuously greater than 140/90 Follow-up in 3 months and consider dose titration Check CMP today Will need other labs at follow-up visit      Relevant Medications   atenolol (TENORMIN) 25 MG tablet   losartan-hydrochlorothiazide (HYZAAR) 50-12.5 MG tablet   Other Relevant Orders   Comprehensive metabolic panel    Other Visit Diagnoses    Wellness examination       Relevant Medications   losartan-hydrochlorothiazide (HYZAAR) 50-12.5 MG tablet       Return in about 3 months (around 04/07/2018) for chronic disease f/u.   The entirety of the information documented in the History of Present Illness, Review of Systems and Physical Exam were personally obtained by me. Portions of this information were initially documented by Christina Holloway, CMA and reviewed by me for thoroughness and accuracy.    Christina Crews, Holloway, MPH [facility] 01/05/2018 4:47 PM

## 2018-01-06 ENCOUNTER — Telehealth: Payer: Self-pay

## 2018-01-06 LAB — COMPREHENSIVE METABOLIC PANEL
ALK PHOS: 64 IU/L (ref 39–117)
ALT: 33 IU/L — AB (ref 0–32)
AST: 26 IU/L (ref 0–40)
Albumin/Globulin Ratio: 1.8 (ref 1.2–2.2)
Albumin: 4.2 g/dL (ref 3.5–5.5)
BILIRUBIN TOTAL: 0.3 mg/dL (ref 0.0–1.2)
BUN/Creatinine Ratio: 14 (ref 9–23)
BUN: 12 mg/dL (ref 6–24)
CHLORIDE: 101 mmol/L (ref 96–106)
CO2: 24 mmol/L (ref 20–29)
Calcium: 9.7 mg/dL (ref 8.7–10.2)
Creatinine, Ser: 0.84 mg/dL (ref 0.57–1.00)
GFR calc Af Amer: 90 mL/min/{1.73_m2} (ref >59)
GFR calc non Af Amer: 78 mL/min/{1.73_m2} (ref >59)
GLUCOSE: 86 mg/dL (ref 65–99)
Globulin, Total: 2.4 g/dL (ref 1.5–4.5)
Potassium: 4.1 mmol/L (ref 3.5–5.2)
Sodium: 141 mmol/L (ref 134–144)
TOTAL PROTEIN: 6.6 g/dL (ref 6.0–8.5)

## 2018-01-06 NOTE — Telephone Encounter (Signed)
Tried calling pt. NA and VM was full. 

## 2018-01-06 NOTE — Telephone Encounter (Signed)
Tried calling pt, NA and VM was not set up.

## 2018-01-06 NOTE — Telephone Encounter (Signed)
-----   Message from Virginia Crews, MD sent at 01/06/2018  9:17 AM EDT ----- Normal kidney function, liver function, electrolytes   Bac[place]al[place], Dionne Bucy, MD, MPH [facility] 01/06/2018 9:17 AM

## 2018-01-06 NOTE — Telephone Encounter (Signed)
-----   Message from Virginia Crews, MD sent at 01/06/2018  9:17 AM EDT ----- Normal kidney function, liver function, electrolytes   Bacigalupo, Dionne Bucy, MD, MPH [facility] 01/06/2018 9:17 AM

## 2018-01-09 ENCOUNTER — Telehealth: Payer: Self-pay | Admitting: Family Medicine

## 2018-01-09 NOTE — Telephone Encounter (Signed)
Tried calling pt. NA and VM was not set up. See pt message.

## 2018-01-09 NOTE — Telephone Encounter (Signed)
Pt states she can't get on my chart and would like someone to call her with lab results

## 2018-01-09 NOTE — Telephone Encounter (Signed)
Pt returned call. Thanks TNP °

## 2018-01-09 NOTE — Telephone Encounter (Signed)
Tried calling pt. NA and VM was full. 

## 2018-01-09 NOTE — Telephone Encounter (Signed)
Pt advised. States she has a H/O gout, and is running low on "gout pills". States she takes the medication PRN, and is requesting a refill. [facility].

## 2018-01-11 ENCOUNTER — Other Ambulatory Visit: Payer: Self-pay | Admitting: Family Medicine

## 2018-01-11 DIAGNOSIS — Z1231 Encounter for screening mammogram for malignant neoplasm of breast: Secondary | ICD-10-CM

## 2018-01-11 NOTE — Telephone Encounter (Signed)
Pt called back again and said the pharmacy does not have the medication on file that she states she uses for gout.  Pt's call back is  [phone].  Thank s Con Memos

## 2018-01-11 NOTE — Telephone Encounter (Signed)
Does she have any idea what these "gout pills" were? Colchicine maybe?  Christina Crews, MD, MPH [facility] 01/11/2018 12:19 PM

## 2018-01-11 NOTE — Telephone Encounter (Signed)
Any suggestions what this might be?

## 2018-01-11 NOTE — Telephone Encounter (Signed)
Pt does not know, but believes she has an old bottle at home. She will look when she gets home and give [place] a call.

## 2018-01-11 NOTE — Telephone Encounter (Signed)
Pt called to say she thinks she through the bottle away.  They were little green pills.  She thinks it started with a D.    She will check with her pharmacy and call back  teri

## 2018-01-12 MED ORDER — COLCHICINE 0.6 MG PO TABS: ORAL_TABLET | ORAL | 1 refills | Status: DC

## 2018-01-12 NOTE — Telephone Encounter (Signed)
Suspect she is talking about colchicine.  If she develops a gout flare, she should start medication within 24-36 hours.  I sent Rx to pharmacy.  She should follow the instructions on the medication about how to take this.  If this is not improving her symptoms, it could be something other than a gout flare and she should come in for a visit.  If she has more than 2 flares in 6 months or so, she should be seen to consider whether she needs prophylactic medication.  Virginia Crews, MD, MPH [facility] 01/12/2018 2:38 PM

## 2018-01-12 NOTE — Telephone Encounter (Signed)
Pt advised.

## 2018-02-01 ENCOUNTER — Ambulatory Visit
Admission: RE | Admit: 2018-02-01 | Discharge: 2018-02-01 | Disposition: A | Discharge status: Home / Self Care | Payer: Managed Care, Other (non HMO) | Source: Ambulatory Visit | Attending: Family Medicine | Admitting: Family Medicine

## 2018-02-01 DIAGNOSIS — Z1231 Encounter for screening mammogram for malignant neoplasm of breast: Secondary | ICD-10-CM | POA: Diagnosis present

## 2018-02-03 NOTE — Progress Notes (Signed)
Advised  ED 

## 2018-02-10 ENCOUNTER — Ambulatory Visit: Payer: Self-pay | Admitting: Family Medicine

## 2018-02-10 VITALS — BP 157/92 | HR 60 | Resp 16 | Ht 67.0 in | Wt 237.0 lb

## 2018-02-10 DIAGNOSIS — Z008 Encounter for other general examination: Secondary | ICD-10-CM

## 2018-02-10 DIAGNOSIS — Z0189 Encounter for other specified special examinations: Principal | ICD-10-CM

## 2018-02-10 NOTE — Progress Notes (Signed)
Subjective: Annual biometrics screening  Patient presents for her annual biometric screening. Patient denies eating a healthy, well-rounded diet or getting regular physical activity.  Patient regularly sees her primary care provider. 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 Patient denies any other issues or concerns.   Review of Systems Unremarkable  Objective  Physical Exam General: Awake, alert and oriented. No acute distress. Well developed, hydrated and nourished. Appears stated age.  HEENT: Supple neck without adenopathy. Sclera is non-icteric. The ear canal is clear without discharge. The tympanic membrane is normal in appearance with normal landmarks and cone of light. Nasal mucosa is pink and moist. Oral mucosa is pink and moist. The pharynx is normal in appearance without tonsillar swelling or exudates.  Skin: Skin in warm, dry and intact without rashes or lesions. Appropriate color for ethnicity. Cardiac: Heart rate and rhythm are normal. No murmurs, gallops, or rubs are auscultated.  Respiratory: The chest wall is symmetric and without deformity. No signs of respiratory distress. Lung sounds are clear in all lobes bilaterally without rales, ronchi, or wheezes.  Neurological: The patient is awake, alert and oriented to person, place, and time with normal speech.  Memory is normal and thought processes intact. No gait abnormalities are appreciated.  Psychiatric: Appropriate mood and affect.   Assessment Annual biometrics screening  Plan  Lipid panel pending. Encouraged routine visits with primary care provider.  Patient's blood pressure is 157/92 today.  Discussed normal values.  Advised patient to monitor this regularly and report abnormal values to her primary care provider. Encouraged patient to get regular exercise and eat a healthy, well-rounded diet.

## 2018-02-11 LAB — LIPID PANEL
CHOLESTEROL TOTAL: 253 mg/dL — AB (ref 100–199)
Chol/HDL Ratio: 5.4 ratio — ABNORMAL HIGH (ref 0.0–4.4)
HDL: 47 mg/dL (ref >39)
LDL Calculated: 170 mg/dL — ABNORMAL HIGH (ref 0–99)
Triglycerides: 181 mg/dL — ABNORMAL HIGH (ref 0–149)
VLDL Cholesterol Cal: 36 mg/dL (ref 5–40)

## 2018-02-13 NOTE — Progress Notes (Signed)
Dear Christina Holloway, I wanted to let you know that your lipid panel came back.  Everything is normal, with the exception of your total cholesterol, triglyceride level, LDL cholesterol, and cholesterol/HDL ratio.  Your total cholesterol is elevated at 253, normal values are between 100 and 199.  Your triglyceride level is elevated at 181, normal values are between 0 and 149. Your LDL cholesterol ("bad cholesterol") is elevated at 170, normal values are below 99.  Your cholesterol/HDL ratio is elevated at 5.4, normal values are between 0 and 4.4 for women or 0 and 5 from men.  These abnormal values increase your risk for cardiovascular disease.  I want you to follow-up with your primary care provider regarding these results.

## 2018-04-07 ENCOUNTER — Ambulatory Visit: Payer: Self-pay | Admitting: Family Medicine

## 2018-04-18 ENCOUNTER — Telehealth: Payer: Self-pay | Admitting: Family Medicine

## 2018-04-18 DIAGNOSIS — Z1211 Encounter for screening for malignant neoplasm of colon: Secondary | ICD-10-CM

## 2018-04-18 NOTE — Telephone Encounter (Signed)
She's right.  OK to place referral as requested for colon cancer screening. Thanks  Virginia Crews, MD, MPH [facility] 04/18/2018 11:06 AM

## 2018-04-18 NOTE — Telephone Encounter (Signed)
GI referral placed

## 2018-04-18 NOTE — Telephone Encounter (Signed)
Pt stated she is due for her colonoscopy and request a referral be sent to [facility] for Dr. Allen Norris. Pt stated she is supposed to have a colonoscopy done every 3 years. Please advise. Thanks TNP

## 2018-04-25 ENCOUNTER — Other Ambulatory Visit: Payer: Self-pay

## 2018-04-25 ENCOUNTER — Telehealth: Payer: Self-pay | Admitting: Gastroenterology

## 2018-04-25 DIAGNOSIS — Z86010 Personal history of colonic polyps: Secondary | ICD-10-CM

## 2018-04-25 NOTE — Telephone Encounter (Signed)
Patient returning call to [facility] to schedule colon from referral.

## 2018-05-29 ENCOUNTER — Other Ambulatory Visit: Payer: Self-pay

## 2018-05-29 DIAGNOSIS — Z86010 Personal history of colonic polyps: Secondary | ICD-10-CM

## 2018-05-30 ENCOUNTER — Encounter
Admission: RE | Disposition: A | Discharge status: Home / Self Care | Payer: Self-pay | Source: Ambulatory Visit | Attending: Gastroenterology

## 2018-05-30 ENCOUNTER — Ambulatory Visit
Admission: RE | Admit: 2018-05-30 | Discharge: 2018-05-30 | Disposition: A | Discharge status: Home / Self Care | Payer: Managed Care, Other (non HMO) | Source: Ambulatory Visit | Attending: Gastroenterology | Admitting: Gastroenterology

## 2018-05-30 ENCOUNTER — Ambulatory Visit: Admit: 2018-05-30 | Payer: PRIVATE HEALTH INSURANCE | Admitting: Gastroenterology

## 2018-05-30 ENCOUNTER — Encounter: Payer: Self-pay | Admitting: *Deleted

## 2018-05-30 ENCOUNTER — Ambulatory Visit: Payer: Managed Care, Other (non HMO) | Admitting: Certified Registered"

## 2018-05-30 DIAGNOSIS — Z880 Allergy status to penicillin: Secondary | ICD-10-CM | POA: Diagnosis not present

## 2018-05-30 DIAGNOSIS — K219 Gastro-esophageal reflux disease without esophagitis: Secondary | ICD-10-CM | POA: Insufficient documentation

## 2018-05-30 DIAGNOSIS — Z6836 Body mass index (BMI) 36.0-36.9, adult: Secondary | ICD-10-CM | POA: Insufficient documentation

## 2018-05-30 DIAGNOSIS — Z1211 Encounter for screening for malignant neoplasm of colon: Secondary | ICD-10-CM | POA: Insufficient documentation

## 2018-05-30 DIAGNOSIS — E669 Obesity, unspecified: Secondary | ICD-10-CM | POA: Diagnosis not present

## 2018-05-30 DIAGNOSIS — Z8249 Family history of ischemic heart disease and other diseases of the circulatory system: Secondary | ICD-10-CM | POA: Diagnosis not present

## 2018-05-30 DIAGNOSIS — R0602 Shortness of breath: Secondary | ICD-10-CM | POA: Insufficient documentation

## 2018-05-30 DIAGNOSIS — Z90710 Acquired absence of both cervix and uterus: Secondary | ICD-10-CM | POA: Diagnosis not present

## 2018-05-30 DIAGNOSIS — M479 Spondylosis, unspecified: Secondary | ICD-10-CM | POA: Insufficient documentation

## 2018-05-30 DIAGNOSIS — K5730 Diverticulosis of large intestine without perforation or abscess without bleeding: Secondary | ICD-10-CM | POA: Diagnosis not present

## 2018-05-30 DIAGNOSIS — M1991 Primary osteoarthritis, unspecified site: Secondary | ICD-10-CM | POA: Insufficient documentation

## 2018-05-30 DIAGNOSIS — Z79899 Other long term (current) drug therapy: Secondary | ICD-10-CM | POA: Insufficient documentation

## 2018-05-30 DIAGNOSIS — Z860100 Personal history of colon polyps, unspecified: Secondary | ICD-10-CM

## 2018-05-30 DIAGNOSIS — E7800 Pure hypercholesterolemia, unspecified: Secondary | ICD-10-CM | POA: Diagnosis not present

## 2018-05-30 DIAGNOSIS — Z803 Family history of malignant neoplasm of breast: Secondary | ICD-10-CM | POA: Diagnosis not present

## 2018-05-30 DIAGNOSIS — M109 Gout, unspecified: Secondary | ICD-10-CM | POA: Insufficient documentation

## 2018-05-30 DIAGNOSIS — I10 Essential (primary) hypertension: Secondary | ICD-10-CM | POA: Diagnosis not present

## 2018-05-30 DIAGNOSIS — Z8042 Family history of malignant neoplasm of prostate: Secondary | ICD-10-CM | POA: Insufficient documentation

## 2018-05-30 DIAGNOSIS — Z8349 Family history of other endocrine, nutritional and metabolic diseases: Secondary | ICD-10-CM | POA: Insufficient documentation

## 2018-05-30 DIAGNOSIS — D125 Benign neoplasm of sigmoid colon: Secondary | ICD-10-CM | POA: Diagnosis not present

## 2018-05-30 HISTORY — PX: COLONOSCOPY WITH PROPOFOL: SHX5780

## 2018-05-30 SURGERY — COLONOSCOPY WITH PROPOFOL
Anesthesia: General

## 2018-05-30 MED ORDER — LIDOCAINE HCL (CARDIAC) PF 100 MG/5ML IV SOSY
PREFILLED_SYRINGE | INTRAVENOUS | Status: DC | PRN
  Administered 2018-05-30: 80 mg via INTRAVENOUS

## 2018-05-30 MED ORDER — PROPOFOL 500 MG/50ML IV EMUL
INTRAVENOUS | Status: DC | PRN
  Administered 2018-05-30: 150 ug/kg/min via INTRAVENOUS

## 2018-05-30 MED ORDER — SODIUM CHLORIDE 0.9 % IV SOLN
INTRAVENOUS | Status: DC
  Administered 2018-05-30: 1000 mL via INTRAVENOUS

## 2018-05-30 MED ORDER — PROPOFOL 10 MG/ML IV BOLUS
INTRAVENOUS | Status: DC | PRN
  Administered 2018-05-30: 100 mg via INTRAVENOUS

## 2018-05-30 MED ORDER — SODIUM CHLORIDE 0.9 % IV SOLN: INTRAVENOUS | Status: DC

## 2018-05-30 NOTE — Op Note (Signed)
[facility] Gastroenterology Patient Name: Christina Holloway Procedure Date: 05/30/2018 2:26 PM MRN: [id number] Account #: [id number] Date of Birth: 1961/02/18 Admit Type: Outpatient Age: 57 Room: [facility] ENDO ROOM 4 Gender: Female Note Status: Finalized Procedure:            Colonoscopy Indications:          Surveillance: Personal history of adenomatous polyps on                        last colonoscopy 3 years ago, Last colonoscopy:                        September 2016 Providers:            Lin Landsman MD, MD Referring MD:         Dionne Bucy. Bacigalupo (Referring MD) Medicines:            Monitored Anesthesia Care Complications:        No immediate complications. Estimated blood loss: None. Procedure:            Pre-Anesthesia Assessment:                       - Prior to the procedure, a History and Physical was                        performed, and patient medications and allergies were                        reviewed. The patient is competent. The risks and                        benefits of the procedure and the sedation options and                        risks were discussed with the patient. All questions                        were answered and informed consent was obtained.                        Patient identification and proposed procedure were                        verified by the physician, the nurse, the                        anesthesiologist, the anesthetist and the technician in                        the pre-procedure area in the procedure room in the                        endoscopy suite. Mental Status Examination: alert and                        oriented. Airway Examination: normal oropharyngeal                        airway and neck mobility. Respiratory Examination:  clear to auscultation. CV Examination: normal.                        Prophylactic Antibiotics: The patient does not require   prophylactic antibiotics. Prior Anticoagulants: The                        patient has taken no previous anticoagulant or                        antiplatelet agents. ASA Grade Assessment: II - A                        patient with mild systemic disease. After reviewing the                        risks and benefits, the patient was deemed in                        satisfactory condition to undergo the procedure. The                        anesthesia plan was to use monitored anesthesia care                        (MAC). Immediately prior to administration of                        medications, the patient was re-assessed for adequacy                        to receive sedatives. The heart rate, respiratory rate,                        oxygen saturations, blood pressure, adequacy of                        pulmonary ventilation, and response to care were                        monitored throughout the procedure. The physical status                        of the patient was re-assessed after the procedure.                       After obtaining informed consent, the colonoscope was                        passed under direct vision. Throughout the procedure,                        the patient's blood pressure, pulse, and oxygen                        saturations were monitored continuously. The                        Colonoscope was introduced through the anus and  advanced to the the cecum, identified by appendiceal                        orifice and ileocecal valve. The colonoscopy was                        performed without difficulty. The patient tolerated the                        procedure well. The quality of the bowel preparation                        was evaluated using the BBPS [facility] Bowel Preparation                        Scale) with scores of: Right Colon = 2 (minor amount of                        residual staining, small fragments of stool and/or                         opaque liquid, but mucosa seen well), Transverse Colon                        = 3 (entire mucosa seen well with no residual staining,                        small fragments of stool or opaque liquid) and Left                        Colon = 3 (entire mucosa seen well with no residual                        staining, small fragments of stool or opaque liquid).                        The total BBPS score equals 8. Findings:      The perianal and digital rectal examinations were normal. Pertinent       negatives include normal sphincter tone and no palpable rectal lesions.      A 4 mm polyp was found in the sigmoid colon. The polyp was sessile. The       polyp was removed with a cold snare. Resection and retrieval were       complete.      Multiple diverticula were found in the sigmoid colon, descending colon       and ascending colon.      The retroflexed view of the distal rectum and anal verge was normal and       showed no anal or rectal abnormalities. Impression:           - One 4 mm polyp in the sigmoid colon, removed with a                        cold snare. Resected and retrieved.                       - Diverticulosis in the sigmoid colon, in the  descending colon and in the ascending colon.                       - The distal rectum and anal verge are normal on                        retroflexion view. Recommendation:       - Discharge patient to home (with escort).                       - Resume previous diet today.                       - Continue present medications.                       - Await pathology results.                       - Repeat colonoscopy in 3 - 5 years for surveillance                        based on pathology results. Procedure Code(s):    --- Professional ---                       [phone], Colonoscopy, flexible; with removal of tumor(s),                        polyp(s), or other lesion(s) by snare technique Diagnosis  Code(s):    --- Professional ---                       Z86.010, Personal history of colonic polyps                       D12.5, Benign neoplasm of sigmoid colon                       K57.30, Diverticulosis of large intestine without                        perforation or abscess without bleeding CPT copyright 2018 American Medical Association. All rights reserved. The codes documented in this report are preliminary and upon coder review may  be revised to meet current compliance requirements. Dr. Ulyess Mort Lin Landsman MD, MD 05/30/2018 3:06:08 PM This report has been signed electronically. Number of Addenda: 0 Note Initiated On: 05/30/2018 2:26 PM Scope Withdrawal Time: 0 hours 11 minutes 32 seconds  Total Procedure Duration: 0 hours 15 minutes 5 seconds       [facility]

## 2018-05-30 NOTE — Transfer of Care (Signed)
Immediate Anesthesia Transfer of Care Note  Patient: Christina Holloway  Procedure(s) Performed: COLONOSCOPY WITH PROPOFOL (N/A )  Patient Location: Endoscopy Unit  Anesthesia Type:General  Level of Consciousness: awake, alert  and oriented  Airway & Oxygen Therapy: Patient connected to nasal cannula oxygen  Post-op Assessment: Post -op Vital signs reviewed and stable  Post vital signs: stable  Last Vitals:  Vitals Value Taken Time  BP 120/80 05/30/2018  3:09 PM  Temp 36.1 C 05/30/2018  3:09 PM  Pulse 66 05/30/2018  3:10 PM  Resp 23 05/30/2018  3:10 PM  SpO2 100 % 05/30/2018  3:10 PM  Vitals shown include unvalidated device data.  Last Pain:  Vitals:   05/30/18 1509  TempSrc: Tympanic  PainSc: 0-No pain      Patients Stated Pain Goal: 0 (15/18/34 3735)  Complications: No apparent anesthesia complications

## 2018-05-30 NOTE — Anesthesia Preprocedure Evaluation (Signed)
Anesthesia Evaluation  Patient identified by MRN, date of birth, ID band Patient awake    Reviewed: Allergy & Precautions, NPO status , Patient's Chart, lab work & pertinent test results  History of Anesthesia Complications Negative for: history of anesthetic complications  Airway Mallampati: II  TM Distance: >3 FB Neck ROM: Full    Dental  (+) Upper Dentures, Partial Lower   Pulmonary neg sleep apnea, neg COPD,    breath sounds clear to auscultation- rhonchi (-) wheezing      Cardiovascular hypertension, Pt. on medications (-) CAD, (-) Past MI, (-) Cardiac Stents and (-) CABG  Rhythm:Regular Rate:Normal - Systolic murmurs and - Diastolic murmurs    Neuro/Psych negative neurological ROS  negative psych ROS   GI/Hepatic Neg liver ROS, GERD  ,  Endo/Other  negative endocrine ROSneg diabetes  Renal/GU negative Renal ROS     Musculoskeletal  (+) Arthritis ,   Abdominal (+) + obese,   Peds  Hematology  (+) anemia ,   Anesthesia Other Findings Past Medical History: 05/02/2015: Abnormal LFTs 08/24/2003: Anemia, iron deficiency     Comment:  in past No date: Arthritis     Comment:  lower back, legs 05/02/2015: Calcium blood increased 05/02/2015: Decreased potassium in the blood 08/24/2003: Essential (primary) hypertension No date: GERD (gastroesophageal reflux disease) 05/02/2015: Gout 04/29/2009: Hypercholesterolemia without hypertriglyceridemia 05/02/2015: Malaise and fatigue No date: Shortness of breath dyspnea     Comment:  1 flight stairs No date: Wears dentures     Comment:  full upper, partial lower   Reproductive/Obstetrics                             Anesthesia Physical Anesthesia Plan  ASA: II  Anesthesia Plan: General   Post-op Pain Management:    Induction: Intravenous  PONV Risk Score and Plan: 2 and Propofol infusion  Airway Management Planned: Natural Airway  Additional  Equipment:   Intra-op Plan:   Post-operative Plan:   Informed Consent: I have reviewed the patients History and Physical, chart, labs and discussed the procedure including the risks, benefits and alternatives for the proposed anesthesia with the patient or authorized representative who has indicated his/her understanding and acceptance.   Dental advisory given  Plan Discussed with: CRNA and Anesthesiologist  Anesthesia Plan Comments:         Anesthesia Quick Evaluation

## 2018-05-30 NOTE — H&P (Signed)
Cephas Darby, MD [address]  [place]  [place], [place] [address]  Main: [phone]  Fax: [phone] Pager: [phone]  Primary Care Physician:  Virginia Crews, MD Primary Gastroenterologist:  Dr. Cephas Darby  Pre-Procedure History & Physical: HPI:  Christina Holloway is a 57 y.o. female is here for an colonoscopy.   Past Medical History:  Diagnosis Date  . Abnormal LFTs 05/02/2015  . Anemia, iron deficiency 08/24/2003   in past  . Arthritis    lower back, legs  . Calcium blood increased 05/02/2015  . Decreased potassium in the blood 05/02/2015  . Essential (primary) hypertension 08/24/2003  . GERD (gastroesophageal reflux disease)   . Gout 05/02/2015  . Hypercholesterolemia without hypertriglyceridemia 04/29/2009  . Malaise and fatigue 05/02/2015  . Shortness of breath dyspnea    1 flight stairs  . Wears dentures    full upper, partial lower    Past Surgical History:  Procedure Laterality Date  . COLONOSCOPY WITH PROPOFOL N/A 05/08/2015   Procedure: COLONOSCOPY WITH PROPOFOL;  Surgeon: Lucilla Lame, MD;  Location: [facility];  Service: Endoscopy;  Laterality: N/A;  . POLYPECTOMY  05/08/2015   Procedure: POLYPECTOMY;  Surgeon: Lucilla Lame, MD;  Location: [facility];  Service: Endoscopy;;  . VAGINAL HYSTERECTOMY      Prior to Admission medications   Medication Sig Start Date End Date Taking? Authorizing Provider  atenolol (TENORMIN) 25 MG tablet Take 1 tablet (25 mg total) by mouth daily. 01/05/18   Virginia Crews, MD  colchicine 0.6 MG tablet Take 1.2mg  PO x1 at first sign of gout flare, then 0.6mg  1 hour later. Continue 0.6mg  BID until 48 hours after resolution of flare. 01/12/18   Virginia Crews, MD  losartan-hydrochlorothiazide (HYZAAR) 50-12.5 MG tablet Take 1 tablet by mouth daily. 01/05/18   Virginia Crews, MD  Vitamin D, Ergocalciferol, (DRISDOL) 50000 units CAPS capsule Take 1 capsule (50,000 Units total) by mouth every 7  (seven) days. Patient not taking: Reported on 01/05/2018 02/08/17   Versie Starks, PA-C    Allergies as of 05/29/2018 - Review Complete 02/10/2018  Allergen Reaction Noted  . Penicillins  05/02/2015    Family History  Problem Relation Age of Onset  . Hypertension Mother   . Thyroid disease Mother   . Prostate cancer Father   . AAA (abdominal aortic aneurysm) Sister   . Breast cancer Paternal Grandmother 54  . Breast cancer Cousin 22    Social History   Socioeconomic History  . Marital status: Single    Spouse name: Not on file  . Number of children: Not on file  . Years of education: Not on file  . Highest education level: Not on file  Occupational History  . Not on file  Social Needs  . Financial resource strain: Not on file  . Food insecurity:    Worry: Not on file    Inability: Not on file  . Transportation needs:    Medical: Not on file    Non-medical: Not on file  Tobacco Use  . Smoking status: Never Smoker  . Smokeless tobacco: Never Used  Substance and Sexual Activity  . Alcohol use: Yes    Alcohol/week: 36.0 standard drinks    Types: 36 Cans of beer per week    Comment: pt says 6-7 beers most days  . Drug use: No  . Sexual activity: Not on file  Lifestyle  . Physical activity:    Days per  week: Not on file    Minutes per session: Not on file  . Stress: To some extent  Relationships  . Social connections:    Talks on phone: Patient refused    Gets together: Patient refused    Attends religious service: Patient refused    Active member of club or organization: Patient refused    Attends meetings of clubs or organizations: Patient refused    Relationship status: Patient refused  . Intimate partner violence:    Fear of current or ex partner: Patient refused    Emotionally abused: Patient refused    Physically abused: Patient refused    Forced sexual activity: Patient refused  Other Topics Concern  . Not on file  Social History Narrative  . Not  on file    Review of Systems: See HPI, otherwise negative ROS  Physical Exam: BP (!) 158/114   Temp (!) 96.7 F (35.9 C) (Tympanic)   Resp 20   Ht 5\' 8"  (1.727 m)   Wt 108.9 kg   SpO2 99%   BMI 36.49 kg/m  General:   Alert,  pleasant and cooperative in NAD Head:  Normocephalic and atraumatic. Neck:  Supple; no masses or thyromegaly. Lungs:  Clear throughout to auscultation.    Heart:  Regular rate and rhythm. Abdomen:  Soft, nontender and nondistended. Normal bowel sounds, without guarding, and without rebound.   Neurologic:  Alert and  oriented x4;  grossly normal neurologically.  Impression/Plan: Christina Holloway is here for an colonoscopy to be performed for h/o colon polyp  Risks, benefits, limitations, and alternatives regarding  colonoscopy have been reviewed with the patient.  Questions have been answered.  All parties agreeable.   Sherri Sear, MD  05/30/2018, 1:43 PM

## 2018-05-30 NOTE — Anesthesia Post-op Follow-up Note (Signed)
Anesthesia QCDR form completed.        

## 2018-05-31 ENCOUNTER — Encounter: Payer: Self-pay | Admitting: Gastroenterology

## 2018-05-31 NOTE — Anesthesia Postprocedure Evaluation (Signed)
Anesthesia Post Note  Patient: Christina Holloway  Procedure(s) Performed: COLONOSCOPY WITH PROPOFOL (N/A )  Patient location during evaluation: Endoscopy Anesthesia Type: General Level of consciousness: awake and alert Pain management: pain level controlled Vital Signs Assessment: post-procedure vital signs reviewed and stable Respiratory status: spontaneous breathing, nonlabored ventilation, respiratory function stable and patient connected to nasal cannula oxygen Cardiovascular status: blood pressure returned to baseline and stable Postop Assessment: no apparent nausea or vomiting Anesthetic complications: no     Last Vitals:  Vitals:   05/30/18 1509 05/30/18 1519  BP: 120/80 (!) 174/84  Pulse: 71 64  Resp: 20 (!) 22  Temp:    SpO2: 97% 95%    Last Pain:  Vitals:   05/30/18 1519  TempSrc:   PainSc: 0-No pain                 Martha Clan

## 2018-06-02 LAB — SURGICAL PATHOLOGY

## 2018-06-08 ENCOUNTER — Encounter: Payer: Self-pay | Admitting: Gastroenterology

## 2018-10-02 ENCOUNTER — Ambulatory Visit: Payer: Self-pay | Admitting: Adult Health

## 2018-10-02 ENCOUNTER — Other Ambulatory Visit: Payer: Self-pay

## 2018-10-02 VITALS — BP 140/76 | HR 87 | Temp 98.3°F | Resp 14

## 2018-10-02 DIAGNOSIS — R0982 Postnasal drip: Secondary | ICD-10-CM

## 2018-10-02 DIAGNOSIS — J029 Acute pharyngitis, unspecified: Secondary | ICD-10-CM

## 2018-10-02 DIAGNOSIS — J039 Acute tonsillitis, unspecified: Secondary | ICD-10-CM

## 2018-10-02 MED ORDER — CETIRIZINE HCL 10 MG PO TABS: 10.0000 mg | ORAL_TABLET | Freq: Every day | ORAL | 0 refills | Status: DC

## 2018-10-02 MED ORDER — CLINDAMYCIN HCL 300 MG PO CAPS: 300.0000 mg | ORAL_CAPSULE | Freq: Three times a day (TID) | ORAL | 0 refills | Status: DC

## 2018-10-02 NOTE — Progress Notes (Signed)
[facility] Subjective:     Patient ID: Christina Holloway, female   DOB: [birth year], 58 y.o.   MRN: [id number]  HPI Patient is a 58 year old female in no acute distress who comes to the clinic for complaints of sore throat, she reports post nasal drainage x 3 to 4 weeks. She points to maxillary sinus and has pressure/pain.  Patient  denies any fever, body aches,chills, rash, chest pain, shortness of breath, nausea, vomiting, or diarrhea.  She has tried Mucinex.  She is using Flonase daily.                                          Allergies  Allergen Reactions  . Penicillins    Patient Active Problem List   Diagnosis Date Noted  . Right knee pain 12/08/2015  . History of colonic polyps   . Absolute anemia 05/02/2015  . Abnormal LFTs 05/02/2015  . Gout 05/02/2015  . Calcium blood increased 05/02/2015  . Decreased potassium in the blood 05/02/2015  . Malaise and fatigue 05/02/2015  . Hypercholesterolemia without hypertriglyceridemia 04/29/2009  . Anemia, iron deficiency 08/24/2003  . Essential (primary) hypertension 08/24/2003     Current Outpatient Medications:  .  atenolol (TENORMIN) 25 MG tablet, Take 1 tablet (25 mg total) by mouth daily., Disp: 30 tablet, Rfl: 11 .  colchicine 0.6 MG tablet, Take 1.2mg  PO x1 at first sign of gout flare, then 0.6mg  1 hour later. Continue 0.6mg  BID until 48 hours after resolution of flare., Disp: 30 tablet, Rfl: 1 .  losartan-hydrochlorothiazide (HYZAAR) 50-12.5 MG tablet, Take 1 tablet by mouth daily., Disp: 30 tablet, Rfl: 11  Review of Systems  Constitutional: Negative for activity change, appetite change, chills, diaphoresis, fatigue, fever and unexpected weight change.  HENT: Positive for postnasal drip, rhinorrhea and sore throat. Negative for congestion, dental problem, drooling, ear discharge, ear pain, facial swelling, hearing loss, mouth sores, nosebleeds,  sinus pressure, sinus pain, sneezing, tinnitus, trouble swallowing and voice change.   Eyes: Negative.   Respiratory: Negative.   Cardiovascular: Negative.   Gastrointestinal: Negative.   Genitourinary: Negative.   Musculoskeletal: Negative.   Skin: Negative.   Neurological: Negative.   Hematological: Negative.   Psychiatric/Behavioral: Negative.        Objective:   Physical Exam Constitutional:      General: She is not in acute distress.    Appearance: She is well-developed. She is not ill-appearing, toxic-appearing or diaphoretic.  HENT:     Head: Normocephalic and atraumatic.     Salivary Glands: Right salivary gland is not diffusely enlarged or tender. Left salivary gland is not diffusely enlarged or tender.     Right Ear: Tympanic membrane and ear canal normal. No drainage, swelling or tenderness. No middle ear effusion. Tympanic membrane is not erythematous.     Left Ear: Tympanic membrane and ear canal normal. No drainage, swelling or tenderness.  No middle ear effusion. Tympanic membrane is not erythematous.     Nose: Congestion and rhinorrhea present.     Mouth/Throat:     Mouth: Mucous membranes are moist. No oral lesions.     Pharynx: No pharyngeal swelling, oropharyngeal exudate, posterior oropharyngeal erythema or uvula swelling.     Tonsils: Tonsillar exudate (left tonsil - white exudate ) present. Swelling: 0 on the right. 1+ on the left.  Eyes:  Extraocular Movements:     Right eye: Normal extraocular motion.     Left eye: Normal extraocular motion.     Conjunctiva/sclera: Conjunctivae normal.     Pupils: Pupils are equal, round, and reactive to light.  Neck:     Musculoskeletal: Normal range of motion and neck supple.     Thyroid: No thyromegaly.     Trachea: Trachea and phonation normal.  Cardiovascular:     Rate and Rhythm: Normal rate and regular rhythm.     Heart sounds: Normal heart sounds. No murmur. No friction rub. No gallop.   Pulmonary:      Effort: Pulmonary effort is normal. No respiratory distress.     Breath sounds: Normal breath sounds. No stridor. No wheezing, rhonchi or rales.  Chest:     Chest wall: No tenderness.  Lymphadenopathy:     Cervical: Cervical adenopathy present.     Right cervical: Superficial cervical adenopathy (mild bialteral mobile, mildly tender ) present. No deep or posterior cervical adenopathy.    Left cervical: Superficial cervical adenopathy present. No deep or posterior cervical adenopathy.  Skin:    General: Skin is warm and dry.     Capillary Refill: Capillary refill takes less than 2 seconds.  Neurological:     General: No focal deficit present.     Mental Status: She is alert and oriented to person, place, and time.     Gait: Gait is intact. Gait normal.  Psychiatric:        Attention and Perception: Attention and perception normal.        Mood and Affect: Mood and affect normal.        Speech: Speech normal.        Behavior: Behavior normal. Behavior is cooperative.        Thought Content: Thought content normal.        Cognition and Memory: Cognition normal.        Judgment: Judgment normal.        Assessment:     Acute tonsillitis, unspecified etiology  Sore throat - Plan: POCT Rapid Strep A-Office (CPT [id number]), Strep A DNA probe  Post-nasal drip      Plan:     Meds ordered this encounter  Medications  . cetirizine (ZYRTEC) 10 MG tablet    Sig: Take 1 tablet (10 mg total) by mouth at bedtime.    Dispense:  30 tablet    Refill:  0  . clindamycin (CLEOCIN) 300 MG capsule    Sig: Take 1 capsule (300 mg total) by mouth 3 (three) times daily.    Dispense:  30 capsule    Refill:  0     Return appointment in 2 weeks for recheck with your Virginia Crews, MD primary care.   See your primary care for routine visit and labwork.  Advised patient call the office or your primary care doctor for an appointment if no improvement within 72 hours or if any symptoms change or  worsen at any time  Advised ER or urgent Care if after hours or on weekend. Call 911 for emergency symptoms at any time.Patinet verbalized understanding of all instructions given/reviewed and treatment plan and has no further questions or concerns at this time.    Patient verbalized understanding of all instructions given and denies any further questions at this time.

## 2018-10-02 NOTE — Patient Instructions (Signed)
Postnasal Drip Postnasal drip is the feeling of mucus going down the back of your throat. Mucus is a slimy substance that moistens and cleans your nose and throat, as well as the air pockets in face bones near your forehead and cheeks (sinuses). Small amounts of mucus pass from your nose and sinuses down the back of your throat all the time. This is normal. When you produce too much mucus or the mucus gets too thick, you can feel it. Some common causes of postnasal drip include:  Having more mucus because of: ? A cold or the flu. ? Allergies. ? Cold air. ? Certain medicines.  Having more mucus that is thicker because of: ? A sinus or nasal infection. ? Dry air. ? A food allergy. Follow these instructions at home: Relieving discomfort   Gargle with a salt-water mixture 3-4 times a day or as needed. To make a salt-water mixture, completely dissolve -1 tsp of salt in 1 cup of warm water.  If the air in your home is dry, use a humidifier to add moisture to the air.  Use a saline spray or container (neti pot) to flush out the nose (nasal irrigation). These methods can help clear away mucus and keep the nasal passages moist. General instructions  Take over-the-counter and prescription medicines only as told by your health care provider.  Follow instructions from your health care provider about eating or drinking restrictions. You may need to avoid caffeine.  Avoid things that you know you are allergic to (allergens), like dust, mold, pollen, pets, or certain foods.  Drink enough fluid to keep your urine pale yellow.  Keep all follow-up visits as told by your health care provider. This is important. Contact a health care provider if:  You have a fever.  You have a sore throat.  You have difficulty swallowing.  You have headache.  You have sinus pain.  You have a cough that does not go away.  The mucus from your nose becomes thick and is green or yellow in color.  You have  cold or flu symptoms that last more than 10 days. Summary  Postnasal drip is the feeling of mucus going down the back of your throat.  If your health care provider approves, use nasal irrigation or a nasal spray 2?4 times a day.  Avoid things that you know you are allergic to (allergens), like dust, mold, pollen, pets, or certain foods. This information is not intended to replace advice given to you by your health care provider. Make sure you discuss any questions you have with your health care provider. Document Released: 11/22/2016 Document Revised: 11/22/2016 Document Reviewed: 11/22/2016 Elsevier Interactive Patient Education  2019 [employer] Cetirizine tablets What is this medicine? CETIRIZINE (se TI ra zeen) is an antihistamine. This medicine is used to treat or prevent symptoms of allergies. It is also used to help reduce itchy skin rash and hives. This medicine may be used for other purposes; ask your health care provider or pharmacist if you have questions. COMMON BRAND NAME(S): All Day Allergy, Allergy Relief, Zyrtec, Zyrtec Hives Relief What should I tell my health care provider before I take this medicine? They need to know if you have any of these conditions: -kidney disease -liver disease -an unusual or allergic reaction to cetirizine, hydroxyzine, other medicines, foods, dyes, or preservatives -pregnant or trying to get pregnant -breast-feeding How should I use this medicine? Take this medicine by mouth with a glass of water. Follow the directions  on the prescription label. You can take this medicine with food or on an empty stomach. Take your medicine at regular times. Do not take more often than directed. You may need to take this medicine for several days before your symptoms improve. Talk to your pediatrician regarding the use of this medicine in children. Special care may be needed. While this drug may be prescribed for children as young as 55 years of age for selected  conditions, precautions do apply. Overdosage: If you think you have taken too much of this medicine contact a poison control center or emergency room at once. NOTE: This medicine is only for you. Do not share this medicine with others. What if I miss a dose? If you miss a dose, take it as soon as you can. If it is almost time for your next dose, take only that dose. Do not take double or extra doses. What may interact with this medicine? -alcohol -certain medicines for anxiety or sleep -narcotic medicines for pain -other medicines for colds or allergies This list may not describe all possible interactions. Give your health care provider a list of all the medicines, herbs, non-prescription drugs, or dietary supplements you use. Also tell them if you smoke, drink alcohol, or use illegal drugs. Some items may interact with your medicine. What should I watch for while using this medicine? Visit your doctor or health care professional for regular checks on your health. Tell your doctor if your symptoms do not improve. You may get drowsy or dizzy. Do not drive, use machinery, or do anything that needs mental alertness until you know how this medicine affects you. Do not stand or sit up quickly, especially if you are an older patient. This reduces the risk of dizzy or fainting spells. Your mouth may get dry. Chewing sugarless gum or sucking hard candy, and drinking plenty of water may help. Contact your doctor if the problem does not go away or is severe. What side effects may I notice from receiving this medicine? Side effects that you should report to your doctor or health care professional as soon as possible: -allergic reactions like skin rash, itching or hives, swelling of the face, lips, or tongue -changes in vision or hearing -fast or irregular heartbeat -trouble passing urine or change in the amount of urine Side effects that usually do not require medical attention (report to your doctor or  health care professional if they continue or are bothersome): -dizziness -dry mouth -irritability -sore throat -stomach pain -tiredness This list may not describe all possible side effects. Call your doctor for medical advice about side effects. You may report side effects to FDA at 1-800-FDA-1088. Where should I keep my medicine? Keep out of the reach of children. Store at room temperature between 15 and 30 degrees C (59 and 86 degrees F). Throw away any unused medicine after the expiration date. NOTE: This sheet is a summary. It may not cover all possible information. If you have questions about this medicine, talk to your doctor, pharmacist, or health care provider.  2019 Elsevier/Gold Standard (2014-09-03 13:44:42) Clindamycin capsules What is this medicine? CLINDAMYCIN ([place] sin) is a lincosamide antibiotic. It is used to treat certain kinds of bacterial infections. It will not work for colds, flu, or other viral infections. This medicine may be used for other purposes; ask your health care provider or pharmacist if you have questions. COMMON BRAND NAME(S): Cleocin What should I tell my health care provider before I take  this medicine? They need to know if you have any of these conditions: -kidney disease -liver disease -stomach problems like colitis -an unusual or allergic reaction to clindamycin, lincomycin, or other medicines, foods, dyes like tartrazine or preservatives -pregnant or trying to get pregnant -breast-feeding How should I use this medicine? Take this medicine by mouth with a full glass of water. Follow the directions on the prescription label. You can take this medicine with food or on an empty stomach. If the medicine upsets your stomach, take it with food. Take your medicine at regular intervals. Do not take your medicine more often than directed. Take all of your medicine as directed even if you think your are better. Do not skip doses or stop your medicine  early. Talk to your pediatrician regarding the use of this medicine in children. Special care may be needed. Overdosage: If you think you have taken too much of this medicine contact a poison control center or emergency room at once. NOTE: This medicine is only for you. Do not share this medicine with others. What if I miss a dose? If you miss a dose, take it as soon as you can. If it is almost time for your next dose, take only that dose. Do not take double or extra doses. What may interact with this medicine? -birth control pills -erythromycin -medicines that relax muscles for surgery -rifampin This list may not describe all possible interactions. Give your health care provider a list of all the medicines, herbs, non-prescription drugs, or dietary supplements you use. Also tell them if you smoke, drink alcohol, or use illegal drugs. Some items may interact with your medicine. What should I watch for while using this medicine? Tell your doctor or healthcare professional if your symptoms do not start to get better or if they get worse. Do not treat diarrhea with over the counter products. Contact your doctor if you have diarrhea that lasts more than 2 days or if it is severe and watery. What side effects may I notice from receiving this medicine? Side effects that you should report to your doctor or health care professional as soon as possible: -allergic reactions like skin rash, itching or hives, swelling of the face, lips, or tongue -dark urine -pain on swallowing -redness, blistering, peeling or loosening of the skin, including inside the mouth -unusual bleeding or bruising -unusually weak or tired -yellowing of eyes or skin Side effects that usually do not require medical attention (report to your doctor or health care professional if they continue or are bothersome): -diarrhea -itching in the rectal or genital area -joint pain -nausea, vomiting -stomach pain This list may not  describe all possible side effects. Call your doctor for medical advice about side effects. You may report side effects to FDA at 1-800-FDA-1088. Where should I keep my medicine? Keep out of the reach of children. Store at room temperature between 20 and 25 degrees C (68 and 77 degrees F). Throw away any unused medicine after the expiration date. NOTE: This sheet is a summary. It may not cover all possible information. If you have questions about this medicine, talk to your doctor, pharmacist, or health care provider.  2019 Elsevier/Gold Standard (2015-11-12 16:34:00) Tonsillitis  Tonsillitis is an infection of the throat. This infection causes the tonsils to become red, tender, and swollen. Tonsils are tissues in the back of your throat. If bacteria caused your infection, antibiotic medicine will be given to you. Sometimes, symptoms of this infection can be treated  with the use of medicines that lessen swelling (steroids). If your tonsillitis is very bad (severe) and happens often, you may need to get your tonsils removed (tonsillectomy). Follow these instructions at home: Medicines  Take over-the-counter and prescription medicines only as told by your doctor.  If you were prescribed an antibiotic, take it as told by your doctor. Do not stop taking the antibiotic even if you start to feel better. Eating and drinking  Drink enough fluid to keep your pee (urine) clear or pale yellow.  While your throat is sore, eat soft or liquid foods like: ? Soup. ? Sherbert. ? Instant breakfast drinks.  Drink warm fluids.  Eat frozen ice pops. General instructions  Rest as much as possible and get plenty of sleep.  Gargle with a salt-water mixture 3-4 times a day or as needed. To make a salt-water mixture, completely dissolve -1 tsp of salt in 1 cup of warm water.  Wash your hands often with soap and water. If there is no soap and water, use hand sanitizer.  Do not share cups, bottles, or other  utensils until your symptoms are gone.  Do not smoke. If you need help quitting, ask your doctor.  Keep all follow-up visits as told by your doctor. This is important. Contact a doctor if:  You have large, tender lumps in your neck.  You have a fever that does not go away after 2-3 days.  You have a rash.  You cough up green, yellow-brown, or bloody fluid.  You cannot swallow liquids or food for 24 hours.  Only one of your tonsils is swollen. Get help right away if:  You have any new symptoms such as: ? Vomiting. ? Very bad headache. ? Stiff neck. ? Chest pain. ? Trouble breathing or swallowing.  You have very bad throat pain and you also have drooling or voice changes.  You have very bad pain that is not helped by medicine.  You cannot fully open your mouth.  You have redness, swelling, or severe pain anywhere in your neck. Summary  Tonsillitis causes your tonsils to be red, tender, and swollen.  While your throat is sore, eat soft or liquid foods.  Gargle with a salt-water mixture 3-4 times a day or as needed.  Do not share cups, bottles, or other utensils until your symptoms are gone. This information is not intended to replace advice given to you by your health care provider. Make sure you discuss any questions you have with your health care provider. Document Released: 01/26/2008 Document Revised: 09/14/2016 Document Reviewed: 09/14/2016 Elsevier Interactive Patient Education  2019 [employer].

## 2018-10-03 LAB — POCT RAPID STREP A (OFFICE): Rapid Strep A Screen: NEGATIVE

## 2018-10-03 LAB — STREP A DNA PROBE: Strep Gp A Direct, DNA Probe: NEGATIVE

## 2018-10-03 NOTE — Progress Notes (Signed)
Strep was negative.

## 2019-01-09 ENCOUNTER — Other Ambulatory Visit: Payer: Self-pay | Admitting: Family Medicine

## 2019-01-09 DIAGNOSIS — Z0000 Encounter for general adult medical examination without abnormal findings: Secondary | ICD-10-CM

## 2019-01-09 DIAGNOSIS — I10 Essential (primary) hypertension: Secondary | ICD-10-CM

## 2019-01-09 NOTE — Telephone Encounter (Signed)
Please review

## 2019-01-10 ENCOUNTER — Telehealth: Payer: Self-pay

## 2019-01-10 NOTE — Telephone Encounter (Signed)
Has been >1 yr since last visit.  Will send 1 month supply, but needs f/u before further refills given

## 2019-01-10 NOTE — Telephone Encounter (Signed)
Left message for patient to call and schedule follow up appointment.

## 2019-01-11 ENCOUNTER — Other Ambulatory Visit: Payer: Self-pay | Admitting: Family Medicine

## 2019-01-11 ENCOUNTER — Ambulatory Visit (INDEPENDENT_AMBULATORY_CARE_PROVIDER_SITE_OTHER): Payer: Managed Care, Other (non HMO) | Admitting: Family Medicine

## 2019-01-11 ENCOUNTER — Encounter: Payer: Self-pay | Admitting: Family Medicine

## 2019-01-11 DIAGNOSIS — I10 Essential (primary) hypertension: Secondary | ICD-10-CM | POA: Diagnosis not present

## 2019-01-11 DIAGNOSIS — D509 Iron deficiency anemia, unspecified: Secondary | ICD-10-CM

## 2019-01-11 DIAGNOSIS — E782 Mixed hyperlipidemia: Secondary | ICD-10-CM | POA: Diagnosis not present

## 2019-01-11 DIAGNOSIS — Z1231 Encounter for screening mammogram for malignant neoplasm of breast: Secondary | ICD-10-CM

## 2019-01-11 MED ORDER — LOSARTAN POTASSIUM-HCTZ 100-12.5 MG PO TABS: 1.0000 | ORAL_TABLET | Freq: Every day | ORAL | 5 refills | Status: DC

## 2019-01-11 MED ORDER — ATENOLOL 25 MG PO TABS: 25.0000 mg | ORAL_TABLET | Freq: Every day | ORAL | 5 refills | Status: DC

## 2019-01-11 NOTE — Patient Instructions (Signed)
Increase losartan-hctz to 100-12.5 mg daily Continue atenolol

## 2019-01-11 NOTE — Assessment & Plan Note (Signed)
Not currently on a statin Recheck lipid panel and calculate ASCVD risk

## 2019-01-11 NOTE — Assessment & Plan Note (Signed)
Recheck CBC. 

## 2019-01-11 NOTE — Progress Notes (Signed)
Patient: Christina Holloway Female    DOB: 1960-11-25   58 y.o.   MRN: [id number] Visit Date: 01/11/2019  Today's Provider: Lavon Paganini, MD   Chief Complaint  Patient presents with  . Hypertension   Subjective:    Virtual Visit via Video Note  I connected with [name] on 01/11/19 at 10:40 AM EDT by a video enabled telemedicine application and verified that I am speaking with the correct person using two identifiers.   Patient location: home Provider location: [place] involved in the visit: patient, provider   I discussed the limitations of evaluation and management by telemedicine and the availability of in person appointments. The patient expressed understanding and agreed to proceed.  Interactive audio and video communications were attempted, although failed due to patient's inability to connect to video. Continued visit with audio only interaction with patient agreement.   HPI  Hypertension, follow-up:  BP Readings from Last 3 Encounters:  10/02/18 140/76  05/30/18 (!) 174/84  02/10/18 (!) 157/92    She was last seen for hypertension 1 years ago.  BP at that visit was 176/104. Management changes since that visit include no changes. She reports good compliance with treatment. She is not having side effects.  She is exercising. She is not adherent to low salt diet.   Outside blood pressures are being checked at home and work.  BP occasionally higher, but mostly in 130s-140s. She is experiencing SOB.  Patient denies chest pain, chest pressure/discomfort, claudication, dyspnea, exertional chest pressure/discomfort, fatigue, irregular heart beat, lower extremity edema, near-syncope, orthopnea, palpitations, paroxysmal nocturnal dyspnea, syncope and tachypnea.   Cardiovascular risk factors include hypertension and obesity (BMI >= 30 kg/m2).  Use of agents associated with hypertension: none.    Weight trend: stable Wt Readings from  Last 3 Encounters:  05/30/18 240 lb (108.9 kg)  02/10/18 237 lb (107.5 kg)  02/07/17 216 lb (98 kg)    Current diet: well balanced  ------------------------------------------------------------------------  Allergies  Allergen Reactions  . Penicillins      Current Outpatient Medications:  .  atenolol (TENORMIN) 25 MG tablet, TAKE 1 TABLET BY MOUTH DAILY, Disp: 30 tablet, Rfl: 0 .  cetirizine (ZYRTEC) 10 MG tablet, Take 1 tablet (10 mg total) by mouth at bedtime., Disp: 30 tablet, Rfl: 0 .  losartan-hydrochlorothiazide (HYZAAR) 50-12.5 MG tablet, TAKE 1 TABLET BY MOUTH DAILY, Disp: 30 tablet, Rfl: 0 .  colchicine 0.6 MG tablet, Take 1.2mg  PO x1 at first sign of gout flare, then 0.6mg  1 hour later. Continue 0.6mg  BID until 48 hours after resolution of flare. (Patient not taking: Reported on 01/11/2019), Disp: 30 tablet, Rfl: 1  Review of Systems  Constitutional: Negative.   Respiratory: Negative.   Cardiovascular: Negative.   Musculoskeletal: Negative.     Social History   Tobacco Use  . Smoking status: Never Smoker  . Smokeless tobacco: Never Used  Substance Use Topics  . Alcohol use: Yes    Alcohol/week: 36.0 standard drinks    Types: 36 Cans of beer per week    Comment: pt says 6-7 beers most days      Objective:   There were no vitals taken for this visit. There were no vitals filed for this visit.   Physical Exam      Assessment & Plan      I discussed the assessment and treatment plan with the patient. The patient was provided an opportunity to ask questions and  all were answered. The patient agreed with the plan and demonstrated an understanding of the instructions.   The patient was advised to call back or seek an in-person evaluation if the symptoms worsen or if the condition fails to improve as anticipated.  Problem List Items Addressed This Visit      Cardiovascular and Mediastinum   Essential (primary) hypertension - Primary    Uncontrolled No red  flag symptoms Continue atenolol at current dose Continue to monitor home BP Increase losartan-hctz to 100-12.5 Discussed that if she starts having gout flares, may need to consider d/c HCTZ checkCMP F/u in 3 motnhs      Relevant Medications   atenolol (TENORMIN) 25 MG tablet   losartan-hydrochlorothiazide (HYZAAR) 100-12.5 MG tablet   Other Relevant Orders   Comprehensive metabolic panel     Other   Anemia, iron deficiency    Recheck CBC      Relevant Orders   CBC w/Diff/Platelet   Mixed hyperlipidemia    Not currently on a statin Recheck lipid panel and calculate ASCVD risk      Relevant Medications   atenolol (TENORMIN) 25 MG tablet   losartan-hydrochlorothiazide (HYZAAR) 100-12.5 MG tablet   Other Relevant Orders   Lipid panel   Comprehensive metabolic panel       Return in about 3 months (around 04/13/2019) for BP f/u.   The entirety of the information documented in the History of Present Illness, Review of Systems and Physical Exam were personally obtained by me. Portions of this information were initially documented by Tiburcio Pea, CMA and reviewed by me for thoroughness and accuracy.    , Dionne Bucy, MD MPH [facility]

## 2019-01-11 NOTE — Assessment & Plan Note (Signed)
Uncontrolled No red flag symptoms Continue atenolol at current dose Continue to monitor home BP Increase losartan-hctz to 100-12.5 Discussed that if she starts having gout flares, may need to consider d/c HCTZ checkCMP F/u in 3 motnhs

## 2019-01-12 LAB — COMPREHENSIVE METABOLIC PANEL
ALT: 45 IU/L — ABNORMAL HIGH (ref 0–32)
AST: 31 IU/L (ref 0–40)
Albumin/Globulin Ratio: 2 (ref 1.2–2.2)
Albumin: 4.7 g/dL (ref 3.8–4.9)
Alkaline Phosphatase: 69 IU/L (ref 39–117)
BUN/Creatinine Ratio: 16 (ref 9–23)
BUN: 12 mg/dL (ref 6–24)
Bilirubin Total: 0.4 mg/dL (ref 0.0–1.2)
CO2: 24 mmol/L (ref 20–29)
Calcium: 9.6 mg/dL (ref 8.7–10.2)
Chloride: 101 mmol/L (ref 96–106)
Creatinine, Ser: 0.75 mg/dL (ref 0.57–1.00)
GFR calc Af Amer: 103 mL/min/{1.73_m2} (ref >59)
GFR calc non Af Amer: 89 mL/min/{1.73_m2} (ref >59)
Globulin, Total: 2.4 g/dL (ref 1.5–4.5)
Glucose: 84 mg/dL (ref 65–99)
Potassium: 4.1 mmol/L (ref 3.5–5.2)
Sodium: 142 mmol/L (ref 134–144)
Total Protein: 7.1 g/dL (ref 6.0–8.5)

## 2019-01-12 LAB — CBC WITH DIFFERENTIAL/PLATELET
Basophils Absolute: 0.1 10*3/uL (ref 0.0–0.2)
Basos: 1 %
EOS (ABSOLUTE): 0.2 10*3/uL (ref 0.0–0.4)
Eos: 2 %
Hematocrit: 40.3 % (ref 34.0–46.6)
Hemoglobin: 13.3 g/dL (ref 11.1–15.9)
Immature Grans (Abs): 0 10*3/uL (ref 0.0–0.1)
Immature Granulocytes: 0 %
Lymphocytes Absolute: 1.9 10*3/uL (ref 0.7–3.1)
Lymphs: 31 %
MCH: 30.5 pg (ref 26.6–33.0)
MCHC: 33 g/dL (ref 31.5–35.7)
MCV: 92 fL (ref 79–97)
Monocytes Absolute: 0.4 10*3/uL (ref 0.1–0.9)
Monocytes: 6 %
Neutrophils Absolute: 3.8 10*3/uL (ref 1.4–7.0)
Neutrophils: 60 %
Platelets: 301 10*3/uL (ref 150–450)
RBC: 4.36 x10E6/uL (ref 3.77–5.28)
RDW: 13.4 % (ref 11.7–15.4)
WBC: 6.3 10*3/uL (ref 3.4–10.8)

## 2019-01-12 LAB — LIPID PANEL
Chol/HDL Ratio: 5.3 ratio — ABNORMAL HIGH (ref 0.0–4.4)
Cholesterol, Total: 242 mg/dL — ABNORMAL HIGH (ref 100–199)
HDL: 46 mg/dL (ref >39)
LDL Calculated: 164 mg/dL — ABNORMAL HIGH (ref 0–99)
Triglycerides: 161 mg/dL — ABNORMAL HIGH (ref 0–149)
VLDL Cholesterol Cal: 32 mg/dL (ref 5–40)

## 2019-01-17 ENCOUNTER — Telehealth: Payer: Self-pay

## 2019-01-17 NOTE — Telephone Encounter (Signed)
Patient was advised.  

## 2019-01-17 NOTE — Telephone Encounter (Signed)
-----   Message from Virginia Crews, MD sent at 01/17/2019  7:55 AM EDT ----- Cholesterol is high.  10 year risk of heart disease/stroke is borderline at 5.6% if BP gets under control.  Consider statin to lower this, but can also try diet low in saturated fat and regular exercise - 30 min at least 5 times per week.  Normal kidney function, liver function, electrolytes, Blood counts, except for liver enzyme is slightly elevated.  Recommend cutting back on alcohol and tylenol use.

## 2019-02-09 ENCOUNTER — Ambulatory Visit
Admission: RE | Admit: 2019-02-09 | Discharge: 2019-02-09 | Disposition: A | Discharge status: Home / Self Care | Payer: Managed Care, Other (non HMO) | Source: Ambulatory Visit | Attending: Family Medicine | Admitting: Family Medicine

## 2019-02-09 ENCOUNTER — Other Ambulatory Visit: Payer: Self-pay

## 2019-02-09 DIAGNOSIS — Z1231 Encounter for screening mammogram for malignant neoplasm of breast: Secondary | ICD-10-CM

## 2019-02-12 ENCOUNTER — Other Ambulatory Visit: Payer: Self-pay | Admitting: Family Medicine

## 2019-02-12 DIAGNOSIS — N6310 Unspecified lump in the right breast, unspecified quadrant: Secondary | ICD-10-CM

## 2019-02-13 ENCOUNTER — Telehealth: Payer: Self-pay | Admitting: Family Medicine

## 2019-02-13 NOTE — Telephone Encounter (Signed)
Patient is scheduled for [place] and MM 02/21/2019. Orders in epic. Patient was advised.

## 2019-02-13 NOTE — Telephone Encounter (Signed)
Pt states she is having trouble with breast when laying down so [facility] wouldn't do mammogram.  States she was told an ultrasound would need to be ordered.    Pt is requesting a call back to be advised on what she needs to do.

## 2019-02-15 ENCOUNTER — Other Ambulatory Visit: Payer: Self-pay | Admitting: Family Medicine

## 2019-02-15 DIAGNOSIS — Z0000 Encounter for general adult medical examination without abnormal findings: Secondary | ICD-10-CM

## 2019-02-19 ENCOUNTER — Other Ambulatory Visit: Payer: Self-pay | Admitting: Family Medicine

## 2019-02-19 DIAGNOSIS — Z0000 Encounter for general adult medical examination without abnormal findings: Secondary | ICD-10-CM

## 2019-02-19 NOTE — Telephone Encounter (Signed)
Disregard request. Already been sent to pharmacy.

## 2019-02-21 ENCOUNTER — Ambulatory Visit
Admission: RE | Admit: 2019-02-21 | Discharge: 2019-02-21 | Disposition: A | Discharge status: Home / Self Care | Payer: Managed Care, Other (non HMO) | Source: Ambulatory Visit | Attending: Family Medicine | Admitting: Family Medicine

## 2019-02-21 ENCOUNTER — Other Ambulatory Visit: Payer: Self-pay

## 2019-02-21 DIAGNOSIS — N6310 Unspecified lump in the right breast, unspecified quadrant: Secondary | ICD-10-CM | POA: Diagnosis present

## 2019-07-09 ENCOUNTER — Ambulatory Visit: Payer: Self-pay

## 2019-07-09 NOTE — Telephone Encounter (Signed)
Patient called stating that she has had light pink on her tissue after urinating. She states this has happened off and on for about 1 week.  She denies burning or pain with urination. She has no fever.  She states she is a little tired. She has no frequency and does not take a blood thinner.  Care advice read to patient.  She verbalized understanding. Call transferred to office for scheduling.  Reason for Disposition . Blood in urine  (Exception: could be normal menstrual bleeding)  Answer Assessment - Initial Assessment Questions 1. COLOR of URINE: "Describe the color of the urine."  (e.g., tea-colored, pink, red, blood clots, bloody)     Light pink mostly when wiping 2. ONSET: "When did the bleeding start?"      1 week ago 3. EPISODES: "How many times has there been blood in the urine?" or "How many times today?"     Not everytime 4. PAIN with URINATION: "Is there any pain with passing your urine?" If so, ask: "How bad is the pain?"  (Scale 1-10; or mild, moderate, severe)    - MILD - complains slightly about urination hurting    - MODERATE - interferes with normal activities      - SEVERE - excruciating, unwilling or unable to urinate because of the pain    none 5. FEVER: "Do you have a fever?" If so, ask: "What is your temperature, how was it measured, and when did it start?"    No 6. ASSOCIATED SYMPTOMS: "Are you passing urine more frequently than usual?"    Not sure 7. OTHER SYMPTOMS: "Do you have any other symptoms?" (e.g., back/flank pain, abdominal pain, vomiting)    No but tired 8. PREGNANCY: "Is there any chance you are pregnant?" "When was your last menstrual period?"    No  Protocols used: URINE - BLOOD IN-A-AH

## 2019-07-09 NOTE — Telephone Encounter (Signed)
FYI

## 2019-07-10 ENCOUNTER — Other Ambulatory Visit: Payer: Self-pay

## 2019-07-10 ENCOUNTER — Ambulatory Visit (INDEPENDENT_AMBULATORY_CARE_PROVIDER_SITE_OTHER): Payer: Managed Care, Other (non HMO) | Admitting: Family Medicine

## 2019-07-10 ENCOUNTER — Encounter: Payer: Self-pay | Admitting: Family Medicine

## 2019-07-10 VITALS — BP 138/86 | HR 57 | Temp 97.3°F | Resp 16 | Ht 67.0 in | Wt 241.0 lb

## 2019-07-10 DIAGNOSIS — R319 Hematuria, unspecified: Secondary | ICD-10-CM | POA: Diagnosis not present

## 2019-07-10 DIAGNOSIS — R300 Dysuria: Secondary | ICD-10-CM | POA: Diagnosis not present

## 2019-07-10 LAB — POCT URINALYSIS DIPSTICK
Bilirubin, UA: NEGATIVE
Glucose, UA: NEGATIVE
Ketones, UA: NEGATIVE
Leukocytes, UA: NEGATIVE
Nitrite, UA: NEGATIVE
Protein, UA: POSITIVE — AB
Spec Grav, UA: 1.025 (ref 1.010–1.025)
Urobilinogen, UA: 0.2 E.U./dL
pH, UA: 6 (ref 5.0–8.0)

## 2019-07-10 NOTE — Progress Notes (Signed)
Patient: Christina Holloway Female    DOB: 15-Feb-1961   58 y.o.   MRN: [id number] Visit Date: 07/10/2019  Today's Provider: Vernie Murders, PA   Chief Complaint  Patient presents with  . Urinary Tract Infection   Subjective:     Urinary Tract Infection  This is a new problem. The current episode started 1 to 4 weeks ago. The problem occurs intermittently. The problem has been unchanged. The quality of the pain is described as aching. The pain is mild. There has been no fever. She is not sexually active. Associated symptoms include flank pain, hematuria and nausea. She has tried NSAIDs for the symptoms. The treatment provided mild relief.   There is light pink blood at times when wiping.    Allergies  Allergen Reactions  . Penicillins      Current Outpatient Medications:  .  atenolol (TENORMIN) 25 MG tablet, Take 1 tablet (25 mg total) by mouth daily., Disp: 30 tablet, Rfl: 5 .  cetirizine (ZYRTEC) 10 MG tablet, Take 1 tablet (10 mg total) by mouth at bedtime., Disp: 30 tablet, Rfl: 0 .  colchicine 0.6 MG tablet, Take 1.2mg  PO x1 at first sign of gout flare, then 0.6mg  1 hour later. Continue 0.6mg  BID until 48 hours after resolution of flare. (Patient not taking: Reported on 01/11/2019), Disp: 30 tablet, Rfl: 1 .  losartan-hydrochlorothiazide (HYZAAR) 100-12.5 MG tablet, Take 1 tablet by mouth daily., Disp: 30 tablet, Rfl: 5  Review of Systems  Gastrointestinal: Positive for nausea.  Genitourinary: Positive for flank pain and hematuria.   Past Medical History:  Diagnosis Date  . Abnormal LFTs 05/02/2015  . Anemia, iron deficiency 08/24/2003   in past  . Arthritis    lower back, legs  . Calcium blood increased 05/02/2015  . Decreased potassium in the blood 05/02/2015  . Essential (primary) hypertension 08/24/2003  . GERD (gastroesophageal reflux disease)   . Gout 05/02/2015  . Hypercholesterolemia without hypertriglyceridemia 04/29/2009  . Malaise and fatigue 05/02/2015  . Shortness  of breath dyspnea    1 flight stairs  . Wears dentures    full upper, partial lower   Past Surgical History:  Procedure Laterality Date  . COLONOSCOPY WITH PROPOFOL N/A 05/08/2015   Procedure: COLONOSCOPY WITH PROPOFOL;  Surgeon: Lucilla Lame, MD;  Location: [facility];  Service: Endoscopy;  Laterality: N/A;  . COLONOSCOPY WITH PROPOFOL N/A 05/30/2018   Procedure: COLONOSCOPY WITH PROPOFOL;  Surgeon: Lin Landsman, MD;  Location: [facility] ENDOSCOPY;  Service: Gastroenterology;  Laterality: N/A;  . POLYPECTOMY  05/08/2015   Procedure: POLYPECTOMY;  Surgeon: Lucilla Lame, MD;  Location: [facility];  Service: Endoscopy;;  . VAGINAL HYSTERECTOMY     Family History  Problem Relation Age of Onset  . Hypertension Mother   . Thyroid disease Mother   . Prostate cancer Father   . AAA (abdominal aortic aneurysm) Sister   . Breast cancer Paternal Grandmother 61  . Breast cancer Cousin 74   Social History   Tobacco Use  . Smoking status: Never Smoker  . Smokeless tobacco: Never Used  Substance Use Topics  . Alcohol use: Yes    Alcohol/week: 36.0 standard drinks    Types: 36 Cans of beer per week    Comment: pt says 6-7 beers most days     Objective:   BP 138/86   Pulse (!) 57   Temp (!) 97.3 F (36.3 C) (Temporal)   Resp 16   Ht  5\' 7"  (1.702 m)   Wt 241 lb (109.3 kg)   SpO2 98%   BMI 37.75 kg/m  Vitals:   07/10/19 0935  BP: 138/86  Pulse: (!) 57  Resp: 16  Temp: (!) 97.3 F (36.3 C)  TempSrc: Temporal  SpO2: 98%  Weight: 241 lb (109.3 kg)  Height: 5\' 7"  (1.702 m)  Body mass index is 37.75 kg/m.  Physical Exam Constitutional:      General: She is not in acute distress.    Appearance: She is well-developed.  HENT:     Head: Normocephalic and atraumatic.     Right Ear: Hearing normal.     Left Ear: Hearing normal.     Nose: Nose normal.  Eyes:     General: Lids are normal. No scleral icterus.       Right eye: No discharge.        Left eye: No  discharge.     Conjunctiva/sclera: Conjunctivae normal.  Pulmonary:     Effort: Pulmonary effort is normal. No respiratory distress.  Abdominal:     General: Bowel sounds are normal.     Palpations: Abdomen is soft.     Tenderness: There is no abdominal tenderness. There is no right CVA tenderness, left CVA tenderness or rebound.  Musculoskeletal: Normal range of motion.  Skin:    Findings: No lesion or rash.  Neurological:     Mental Status: She is alert and oriented to person, place, and time.  Psychiatric:        Speech: Speech normal.        Behavior: Behavior normal.        Thought Content: Thought content normal.     Results for orders placed or performed in visit on 07/10/19  POCT Urinalysis Dipstick  Result Value Ref Range   Color, UA Dark Yellow    Clarity, UA Clear    Glucose, UA Negative Negative   Bilirubin, UA Negative    Ketones, UA Negative    Spec Grav, UA 1.025 1.010 - 1.025   Blood, UA 3+    pH, UA 6.0 5.0 - 8.0   Protein, UA Positive (A) Negative   Urobilinogen, UA 0.2 0.2 or 1.0 E.U./dL   Nitrite, UA Negative    Leukocytes, UA Negative Negative   Appearance Dark Yellow    Odor         Assessment & Plan    1. Dysuria Minimal discomfort with fatigue. Urinalysis shows gross hematuria with bacteria and a few WBC's. Will get C&S and encouraged to drink extra fluids. May use AZO-Standard if any burning or stinging with urination. - POCT Urinalysis Dipstick  2. Hematuria, unspecified type Noted some pink colored urine and staining on tissue after urinating over the past 1-2 weeks. No fever or back pain. Will check C&S and follow up pending reports. - Urine Culture     Vernie Murders, PA  [facility]

## 2019-07-12 ENCOUNTER — Ambulatory Visit: Payer: Self-pay | Admitting: Family Medicine

## 2019-07-12 LAB — URINE CULTURE: Organism ID, Bacteria: NO GROWTH

## 2019-07-18 ENCOUNTER — Ambulatory Visit: Payer: Self-pay

## 2019-07-23 ENCOUNTER — Telehealth: Payer: Self-pay

## 2019-07-23 DIAGNOSIS — R319 Hematuria, unspecified: Secondary | ICD-10-CM

## 2019-07-23 NOTE — Telephone Encounter (Signed)
Patient reports she is still seeing blood in her urine. Patient would like to go ahead with ultrasound.

## 2019-07-23 NOTE — Telephone Encounter (Signed)
Appears that she was seeing Simona Huh for this.  Maybe he mentioned renal US?

## 2019-07-23 NOTE — Telephone Encounter (Signed)
Thought that would be the next step and may need urology referral.

## 2019-07-23 NOTE — Telephone Encounter (Signed)
Renal US ordered and referral to Urology placed

## 2019-07-25 ENCOUNTER — Ambulatory Visit: Payer: Managed Care, Other (non HMO) | Admitting: Family Medicine

## 2019-07-31 ENCOUNTER — Ambulatory Visit
Admission: RE | Admit: 2019-07-31 | Discharge: 2019-07-31 | Disposition: A | Discharge status: Home / Self Care | Payer: Managed Care, Other (non HMO) | Source: Ambulatory Visit | Attending: Family Medicine | Admitting: Family Medicine

## 2019-07-31 ENCOUNTER — Other Ambulatory Visit: Payer: Self-pay

## 2019-07-31 DIAGNOSIS — R319 Hematuria, unspecified: Secondary | ICD-10-CM | POA: Insufficient documentation

## 2019-08-01 ENCOUNTER — Telehealth: Payer: Self-pay

## 2019-08-01 NOTE — Telephone Encounter (Signed)
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  CMAs, please call Urology office and see if she can be seen any sooner. Thanks!

## 2019-08-01 NOTE — Telephone Encounter (Signed)
appt for 08/02/2019 at 2:30 pm with Nickolas Madrid, MD. Patient advised.

## 2019-08-02 ENCOUNTER — Other Ambulatory Visit: Payer: Self-pay

## 2019-08-02 ENCOUNTER — Other Ambulatory Visit: Payer: Self-pay | Admitting: Radiology

## 2019-08-02 ENCOUNTER — Encounter: Payer: Self-pay | Admitting: Urology

## 2019-08-02 ENCOUNTER — Ambulatory Visit: Payer: Managed Care, Other (non HMO) | Admitting: Urology

## 2019-08-02 VITALS — BP 190/110 | HR 78 | Ht 67.0 in | Wt 226.0 lb

## 2019-08-02 DIAGNOSIS — R310 Gross hematuria: Secondary | ICD-10-CM | POA: Diagnosis not present

## 2019-08-02 DIAGNOSIS — D494 Neoplasm of unspecified behavior of bladder: Secondary | ICD-10-CM

## 2019-08-02 DIAGNOSIS — R319 Hematuria, unspecified: Secondary | ICD-10-CM

## 2019-08-02 NOTE — Patient Instructions (Addendum)
Transurethral Resection of Bladder Tumor  Transurethral resection of a bladder tumor is the removal (resection) of a cancerous growth (tumor) on the inside wall of the bladder. The bladder is the organ that holds urine. The tumor is removed through the tube that carries urine out of the body (urethra). In a transurethral resection, a thin telescope with a light, a tiny camera, and an electric cutting edge (resectoscope) is passed through the urethra. In men, the opening of the urethra is at the end of the penis. In women, it is just above the opening of the vagina. Tell a health care provider about:  Any allergies you have.  All medicines you are taking, including vitamins, herbs, eye drops, creams, and over-the-counter medicines.  Any problems you or family members have had with anesthetic medicines.  Any blood disorders you have.  Any surgeries you have had.  Any medical conditions you have.  Any recent urinary tract infections you have had.  Whether you are pregnant or may be pregnant. What are the risks? Generally, this is a safe procedure. However, problems may occur, including:  Infection.  Bleeding.  Allergic reactions to medicines.  Damage to nearby structures or organs, such as: ? The urethra. ? The tubes that drain urine from the kidneys into the bladder (ureters).  Pain and burning during urination.  Difficulty urinating due to partial blockage of the urethra.  Inability to urinate (urinary retention). What happens before the procedure? Staying hydrated Follow instructions from your health care provider about hydration, which may include:  Up to 2 hours before the procedure - you may continue to drink clear liquids, such as water, clear fruit juice, black coffee, and plain tea.  Eating and drinking restrictions Follow instructions from your health care provider about eating and drinking, which may include:  8 hours before the procedure - stop eating heavy  meals or foods, such as meat, fried foods, or fatty foods.  6 hours before the procedure - stop eating light meals or foods, such as toast or cereal.  6 hours before the procedure - stop drinking milk or drinks that contain milk.  2 hours before the procedure - stop drinking clear liquids. Medicines Ask your health care provider about:  Changing or stopping your regular medicines. This is especially important if you are taking diabetes medicines or blood thinners.  Taking medicines such as aspirin and ibuprofen. These medicines can thin your blood. Do not take these medicines unless your health care provider tells you to take them.  Taking over-the-counter medicines, vitamins, herbs, and supplements. Tests You may have exams or tests, including:  Physical exam.  Blood tests.  Urine tests.  Electrocardiogram (ECG). This test measures the electrical activity of the heart. General instructions  Plan to have someone take you home from the hospital or clinic.  Ask your health care provider how your surgical site will be marked or identified.  Ask your health care provider what steps will be taken to help prevent infection. These may include: ? Washing skin with a germ-killing soap. ? Taking antibiotic medicine. What happens during the procedure?  An IV will be inserted into one of your veins.  You will be given one or more of the following: ? A medicine to help you relax (sedative). ? A medicine to make you fall asleep (general anesthetic). ? A medicine that is injected into your spine to numb the area below and slightly above the injection site (spinal anesthetic).  Your legs will be   placed in foot rests (stirrups) so that your legs are apart and your knees are bent.  The resectoscope will be passed through your urethra and into your bladder.  The part of your bladder that is affected by the tumor will be resected using the cutting edge of the resectoscope.  The  resectoscope will be removed.  A thin, flexible tube (catheter) will be passed through your urethra and into your bladder. The catheter will drain urine into a bag outside of your body. ? Fluid may be passed through the catheter to keep the catheter open. The procedure may vary among health care providers and hospitals. What happens after the procedure?  Your blood pressure, heart rate, breathing rate, and blood oxygen level will be monitored until you leave the hospital or clinic.  You may continue to receive fluids and medicines through an IV.  You will have some pain. You will be given pain medicine to relieve pain.  You will have a catheter to drain your urine. ? You will have blood in your urine. Your catheter may be kept in until your urine is clear. ? The amount of urine will be monitored. If necessary, your bladder may be rinsed out (irrigated) by passing fluid through your catheter.  You will be encouraged to walk around as soon as possible.  You may have to wear compression stockings. These stockings help to prevent blood clots and reduce swelling in your legs.  Do not drive for 24 hours if you were given a sedative during your procedure. Summary  Transurethral resection of a bladder tumor is the removal (resection) of a cancerous growth (tumor) on the inside wall of the bladder.  To do this procedure, your health care provider uses a thin telescope with a light, a tiny camera, and an electric cutting edge (resectoscope).  Follow your health care provider's instructions. You may need to stop or change certain medicines, and you may be told to stop eating and drinking several hours before the procedure.  Your blood pressure, heart rate, breathing rate, and blood oxygen level will be monitored until you leave the hospital or clinic.  You may have to wear compression stockings. These stockings help to prevent blood clots and reduce swelling in your legs. This information is  not intended to replace advice given to you by your health care provider. Make sure you discuss any questions you have with your health care provider. Document Released: 06/05/2009 Document Revised: 03/10/2018 Document Reviewed: 03/10/2018 Elsevier Patient Education  [place]. Bladder Cancer  Bladder cancer is an abnormal growth of tissue in the bladder. The bladder is the balloon-like sac in the pelvis. It collects and stores urine that comes from the kidneys through the ureters. The bladder wall is made of layers. If cancer spreads into these layers and through the wall of the bladder, it becomes more difficult to treat. What are the causes? The cause of this condition is not known. What increases the risk? The following factors may make you more likely to develop this condition:  Smoking.  Workplace risks (occupational exposures), such as rubber, leather, textile, dyes, chemicals, and paint.  Being white.  Your age. Most people with bladder cancer are over the age of 45.  Being female.  Having chronic bladder inflammation.  Having a personal history of bladder cancer.  Having a family history of bladder cancer (heredity).  Having had chemotherapy or radiation therapy to the pelvis.  Having been exposed to arsenic. What are the  signs or symptoms? Initial symptoms of this condition include:  Blood in the urine.  Painful urination.  Frequent bladder or urine infections.  Increase in urgency and frequency of urination. Advanced symptoms of this condition include:  Not being able to urinate.  Low back pain on one side.  Loss of appetite.  Weight loss.  Fatigue.  Swelling in the feet.  Bone pain. How is this diagnosed? This condition is diagnosed based on your medical history, a physical exam, urine tests, lab tests, imaging tests, and your symptoms. You may also have other tests or procedures done, such as:  A narrow tube being inserted into your  bladder through your urethra (cystoscopy) in order to view the lining of your bladder for tumors.  A biopsy to sample the tumor to see if cancer is present. If cancer is present, it will then be staged to determine its severity and extent. Staging is an assessment of:  The size of the tumor.  Whether the cancer has spread.  Where the cancer has spread. It is important to know how deeply into the bladder wall cancer has grown and whether cancer has spread to any other parts of your body. Staging may require blood tests or imaging tests, such as a CT scan, MRI, bone scan, or chest X-ray. How is this treated? Based on the stage of cancer, one treatment or a combination of treatments may be recommended. The most common forms of treatment are:  Surgery to remove the cancer. Procedures that may be done include transurethral resection and cystectomy.  Radiation therapy. This is high-energy X-rays or other particles. This is often used in combination with chemotherapy.  Chemotherapy. During this treatment, medicines are used to kill cancer cells.  Immunotherapy. This uses medicines to help your own immune system destroy cancer cells. Follow these instructions at home:  Take over-the-counter and prescription medicines only as told by your health care provider.  Maintain a healthy diet. Some of your treatments might affect your appetite.  Consider joining a support group. This may help you learn to cope with the stress of having bladder cancer.  Tell your cancer care team if you develop side effects. They may be able to recommend ways to relieve them.  Keep all follow-up visits as told by your health care provider. This is important. Where to find more information  American Cancer Society: www.cancer.[place] www.cancer.gov Contact a health care provider if:  You have symptoms of a urinary tract infection. These  include: ? Fever. ? Chills. ? Weakness. ? Muscle aches. ? Abdominal pain. ? Frequent and intense urge to urinate. ? Burning feeling in the bladder or urethra during urination. Get help right away if:  There is blood in your urine.  You cannot urinate.  You have severe pain or other symptoms that do not go away. Summary  Bladder cancer is an abnormal growth of tissue in the bladder.  This condition is diagnosed based on your medical history, a physical exam, urine tests, lab tests, imaging tests, and your symptoms.  Based on the stage of cancer, surgery, chemotherapy, or a combination of treatments may be recommended.  Consider joining a support group. This may help you learn to cope with the stress of having bladder cancer. This information is not intended to replace advice given to you by your health care provider. Make sure you discuss any questions you have with your health care provider. Document Released: 08/12/2003 Document Revised: 07/22/2017 Document Reviewed:  07/13/2016 Elsevier Patient Education  [place].

## 2019-08-02 NOTE — Progress Notes (Signed)
08/02/19 2:23 PM   Christina Holloway 07-21-1961 [id number]  Referring provider: Virginia Crews, MD [address] [place],  [place] [address]  CC: Gross hematuria  HPI: I saw Christina Holloway in urology clinic in consultation for gross hematuria from Dr. Brita Romp.  She is a healthy 58 year old African-American female who reports intermittent episodes of gross hematuria since mid November.  An ultrasound was performed by her primary on 07/31/2019 that showed a possible 2.5 cm posterior right bladder lesion worrisome for a bladder tumor.  She denies any weight loss or bone pain.  She is a never smoker, and denies any other carcinogenic exposures.  She lives by herself.  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  Her surgical history is notable for hysterectomy.  She denies any flank pain.  There is no cross-sectional imaging to review.  She denies any family history of bladder cancer.   PMH: Past Medical History:  Diagnosis Date   Abnormal LFTs 05/02/2015   Anemia, iron deficiency 08/24/2003   in past   Arthritis    lower back, legs   Calcium blood increased 05/02/2015   Decreased potassium in the blood 05/02/2015   Essential (primary) hypertension 08/24/2003   GERD (gastroesophageal reflux disease)    Gout 05/02/2015   Hypercholesterolemia without hypertriglyceridemia 04/29/2009   Malaise and fatigue 05/02/2015   Shortness of breath dyspnea    1 flight stairs   Wears dentures    full upper, partial lower    Surgical History: Past Surgical History:  Procedure Laterality Date   COLONOSCOPY WITH PROPOFOL N/A 05/08/2015   Procedure: COLONOSCOPY WITH PROPOFOL;  Surgeon: Lucilla Lame, MD;  Location: [facility];  Service: Endoscopy;  Laterality: N/A;   COLONOSCOPY WITH PROPOFOL N/A 05/30/2018   Procedure: COLONOSCOPY WITH PROPOFOL;  Surgeon: Lin Landsman, MD;  [facility] ENDOSCOPY;  Service: Gastroenterology;  Laterality: N/A;   POLYPECTOMY  05/08/2015     Procedure: POLYPECTOMY;  Surgeon: Lucilla Lame, MD;  Location: [facility];  Service: Endoscopy;;   VAGINAL HYSTERECTOMY      Allergies:  Allergies  Allergen Reactions   Penicillins     Family History: Family History  Problem Relation Age of Onset   Hypertension Mother    Thyroid disease Mother    Prostate cancer Father    AAA (abdominal aortic aneurysm) Sister    Breast cancer Paternal Grandmother 2   Breast cancer Cousin 18    Social History:  reports that she has never smoked. She has never used smokeless tobacco. She reports current alcohol use of about 36.0 standard drinks of alcohol per week. She reports that she does not use drugs.  ROS: Please see flowsheet from today's date for complete review of systems.  Physical Exam: BP (!) 190/110    Pulse 78    Ht 5\' 7"  (1.702 m)    Wt 226 lb (102.5 kg)    BMI 35.40 kg/m    Constitutional:  Alert and oriented, No acute distress. Cardiovascular: Regular rate and rhythm Respiratory: Clear to auscultation bilaterally GI: Abdomen is soft, nontender, nondistended, no abdominal masses GU: No CVA tenderness Lymph: No cervical or inguinal lymphadenopathy. Skin: No rashes, bruises or suspicious lesions. Neurologic: Grossly intact, no focal deficits, moving all 4 extremities. Psychiatric: Normal mood and affect.  Laboratory Data: Reviewed  Urinalysis today 6-10 WBCs, 11-30 RBCs, few bacteria, no yeast, nitrite negative  Pertinent Imaging: I have personally reviewed the renal ultrasound.  No hydronephrosis, 2.5 cm  posterior bladder mass worrisome for bladder tumor.  Cystoscopy Procedure Note:  Indication: 2cm bladder mass on U/S, gross hematuria  After informed consent and discussion of the procedure and its risks, Christina Holloway was positioned and prepped in the standard fashion. Cystoscopy was performed with a flexible cystoscope. The urethra, bladder neck and entire bladder was visualized in a standard  fashion.  The ureteral orifices were visualized in their normal location and orientation.  There is a 2.5 cm papillary appearing bladder tumor at the right posterior bladder wall.  Does not involve the ureteral orifices.  No other satellite lesions visualized.  Assessment and plan: In summary, the patient is a healthy 58 year old female who presents with 3 to 4 weeks of intermittent gross hematuria, and is found to have a 2.5 cm papillary appearing bladder tumor worrisome for urothelial cell carcinoma on cystoscopy today.  We discussed these findings at length, and the need for staging imaging and completion of gross hematuria work-up with a CT urogram.  We discussed transurethral resection of bladder tumor (TURBT) and risks and benefits at length. This is typically a 1 to 2-hour procedure done under general anesthesia in the operating room.  A scope is inserted through the urethra and used to resect abnormal tissue within the bladder, which is then sent to the pathologist to determine grade and stage of the tumor.  Risks include bleeding, infection, need for temporary Foley placement, and bladder perforation.  Treatment strategies are based on the type of tumor and depth of invasion.  We reviewed the different treatment pathways for non-muscle invasive and muscle invasive bladder cancer.  CT urogram ordered, complete pre-op Schedule TURBT with gemcitabine  A total of 60 minutes were spent face-to-face with the patient, greater than 50% was spent in patient education, counseling, and coordination of care regarding gross hematuria, bladder tumor, and treatment strategies.   Billey Co, [facility] [address] [place], [place] [address] [phone]

## 2019-08-03 ENCOUNTER — Other Ambulatory Visit: Payer: Self-pay | Admitting: Radiology

## 2019-08-03 DIAGNOSIS — D494 Neoplasm of unspecified behavior of bladder: Secondary | ICD-10-CM

## 2019-08-03 LAB — MICROSCOPIC EXAMINATION: Epithelial Cells (non renal): NONE SEEN /hpf (ref 0–10)

## 2019-08-03 LAB — URINALYSIS, COMPLETE
Bilirubin, UA: NEGATIVE
Glucose, UA: NEGATIVE
Ketones, UA: NEGATIVE
Leukocytes,UA: NEGATIVE
Nitrite, UA: NEGATIVE
Specific Gravity, UA: 1.015 (ref 1.005–1.030)
Urobilinogen, Ur: 1 mg/dL (ref 0.2–1.0)
pH, UA: 6 (ref 5.0–7.5)

## 2019-08-03 MED ORDER — GEMCITABINE CHEMO FOR BLADDER INSTILLATION 2000 MG: 2000.0000 mg | Freq: Once | INTRAVENOUS | Status: DC

## 2019-08-05 LAB — CULTURE, URINE COMPREHENSIVE

## 2019-08-06 ENCOUNTER — Other Ambulatory Visit: Payer: Self-pay | Admitting: Radiology

## 2019-08-06 ENCOUNTER — Ambulatory Visit
Admission: RE | Admit: 2019-08-06 | Discharge: 2019-08-06 | Disposition: A | Discharge status: Home / Self Care | Payer: Managed Care, Other (non HMO) | Source: Ambulatory Visit | Attending: Urology | Admitting: Urology

## 2019-08-06 ENCOUNTER — Other Ambulatory Visit: Payer: Self-pay

## 2019-08-06 ENCOUNTER — Encounter
Admission: RE | Admit: 2019-08-06 | Discharge: 2019-08-06 | Disposition: A | Discharge status: Home / Self Care | Payer: Managed Care, Other (non HMO) | Source: Ambulatory Visit | Attending: Urology | Admitting: Urology

## 2019-08-06 DIAGNOSIS — R310 Gross hematuria: Secondary | ICD-10-CM | POA: Insufficient documentation

## 2019-08-06 HISTORY — DX: Malignant (primary) neoplasm, unspecified: C80.1

## 2019-08-06 LAB — POCT I-STAT CREATININE: Creatinine, Ser: 1 mg/dL (ref 0.44–1.00)

## 2019-08-06 MED ORDER — IOHEXOL 300 MG/ML  SOLN
150.0000 mL | Freq: Once | INTRAMUSCULAR | Status: AC | PRN
  Administered 2019-08-06: 125 mL via INTRAVENOUS

## 2019-08-06 NOTE — Patient Instructions (Signed)
Your procedure is scheduled on: 08-10-19 FRIDAY Report to Same Day Surgery 2nd floor medical mall [facility] Entrance-take elevator on left to 2nd floor.  Check in with surgery information desk.) To find out your arrival time please call [phone] between 1PM - 3PM on 08-09-19 THURSDAY  Remember: Instructions that are not followed completely may result in serious medical risk, up to and including death, or upon the discretion of your surgeon and anesthesiologist your surgery may need to be rescheduled.    _x___ 1. Do not eat food after midnight the night before your procedure. NO GUM OR CANDY AFTER MIDNIGHT. You may drink clear liquids up to 2 hours before you are scheduled to arrive at the hospital for your procedure.  Do not drink clear liquids within 2 hours of your scheduled arrival to the hospital.  Clear liquids include  --Water or Apple juice without pulp  --Gatorade  --Black Coffee or Clear Tea (No milk, no creamers, do not add anything to the coffee or Tea   ____Ensure clear carbohydrate drink on the way to the hospital for bariatric patients  ____Ensure clear carbohydrate drink 3 hours before surgery.    __x__ 2. No Alcohol for 24 hours before or after surgery.   __x__3. No Smoking or e-cigarettes for 24 prior to surgery.  Do not use any chewable tobacco products for at least 6 hour prior to surgery   ____  4. Bring all medications with you on the day of surgery if instructed.    __x__ 5. Notify your doctor if there is any change in your medical condition     (cold, fever, infections).    x___6. On the morning of surgery brush your teeth with toothpaste and water.  You may rinse your mouth with mouth wash if you wish.  Do not swallow any toothpaste or mouthwash.   Do not wear jewelry, make-up, hairpins, clips or nail polish.  Do not wear lotions, powders, or perfumes. You may wear deodorant.  Do not shave 48 hours prior to surgery. Men may shave face and neck.  Do  not bring valuables to the hospital.    [facility] is not responsible for any belongings or valuables.               Contacts, dentures or bridgework may not be worn into surgery.  Leave your suitcase in the car. After surgery it may be brought to your room.  For patients admitted to the hospital, discharge time is determined by your treatment team.  _  Patients discharged the day of surgery will not be allowed to drive home.  You will need someone to drive you home and stay with you the night of your procedure.    Please read over the following fact sheets that you were given:   [facility] Preparing for Surgery and or MRSA Information   _x___ TAKE THE FOLLOWING MEDICATION THE MORNING OF SURGERY WITH A SMALL SIP OF WATER. These include:  1. ATENOLOL  2.  3.  4.  5.  6.  ____Fleets enema or Magnesium Citrate as directed.   ____ Use CHG Soap or sage wipes as directed on instruction sheet   ____ Use inhalers on the day of surgery and bring to hospital day of surgery  ____ Stop Metformin and Janumet 2 days prior to surgery.    ____ Take 1/2 of usual insulin dose the night before surgery and none on the morning surgery.   ____ Follow recommendations  from Cardiologist, Pulmonologist or PCP regarding stopping Aspirin, Coumadin, Plavix ,Eliquis, Effient, or Pradaxa, and Pletal.  X____Stop Anti-inflammatories such as Advil, Aleve, Ibuprofen, Motrin, Naproxen, Naprosyn, Goodies powders or aspirin products NOW- OK to take Tylenol    ____ Stop supplements until after surgery.     ____ Bring C-Pap to the hospital.

## 2019-08-07 ENCOUNTER — Encounter
Admission: RE | Admit: 2019-08-07 | Discharge: 2019-08-07 | Disposition: A | Discharge status: Home / Self Care | Payer: Managed Care, Other (non HMO) | Source: Ambulatory Visit | Attending: Urology | Admitting: Urology

## 2019-08-07 ENCOUNTER — Other Ambulatory Visit: Payer: Self-pay | Admitting: Urology

## 2019-08-07 ENCOUNTER — Other Ambulatory Visit: Payer: Self-pay | Admitting: Family Medicine

## 2019-08-07 ENCOUNTER — Other Ambulatory Visit: Payer: Managed Care, Other (non HMO)

## 2019-08-07 ENCOUNTER — Telehealth: Payer: Self-pay | Admitting: Radiology

## 2019-08-07 DIAGNOSIS — R9431 Abnormal electrocardiogram [ECG] [EKG]: Secondary | ICD-10-CM | POA: Diagnosis not present

## 2019-08-07 DIAGNOSIS — D494 Neoplasm of unspecified behavior of bladder: Secondary | ICD-10-CM | POA: Insufficient documentation

## 2019-08-07 DIAGNOSIS — I10 Essential (primary) hypertension: Secondary | ICD-10-CM | POA: Insufficient documentation

## 2019-08-07 DIAGNOSIS — R001 Bradycardia, unspecified: Secondary | ICD-10-CM | POA: Diagnosis not present

## 2019-08-07 DIAGNOSIS — Z20828 Contact with and (suspected) exposure to other viral communicable diseases: Secondary | ICD-10-CM | POA: Diagnosis not present

## 2019-08-07 DIAGNOSIS — Z01818 Encounter for other preprocedural examination: Secondary | ICD-10-CM | POA: Insufficient documentation

## 2019-08-07 DIAGNOSIS — E876 Hypokalemia: Secondary | ICD-10-CM

## 2019-08-07 LAB — COMPREHENSIVE METABOLIC PANEL
ALT: 32 U/L (ref 0–44)
AST: 25 U/L (ref 15–41)
Albumin: 4.4 g/dL (ref 3.5–5.0)
Alkaline Phosphatase: 50 U/L (ref 38–126)
Anion gap: 11 (ref 5–15)
BUN: 15 mg/dL (ref 6–20)
CO2: 25 mmol/L (ref 22–32)
Calcium: 9.4 mg/dL (ref 8.9–10.3)
Chloride: 102 mmol/L (ref 98–111)
Creatinine, Ser: 0.91 mg/dL (ref 0.44–1.00)
GFR calc Af Amer: >60 mL/min (ref >60)
GFR calc non Af Amer: >60 mL/min (ref >60)
Glucose, Bld: 99 mg/dL (ref 70–99)
Potassium: 3.3 mmol/L — ABNORMAL LOW (ref 3.5–5.1)
Sodium: 138 mmol/L (ref 135–145)
Total Bilirubin: 0.6 mg/dL (ref 0.3–1.2)
Total Protein: 7.4 g/dL (ref 6.5–8.1)

## 2019-08-07 LAB — CBC
HCT: 37.9 % (ref 36.0–46.0)
Hemoglobin: 13 g/dL (ref 12.0–15.0)
MCH: 29.7 pg (ref 26.0–34.0)
MCHC: 34.3 g/dL (ref 30.0–36.0)
MCV: 86.5 fL (ref 80.0–100.0)
Platelets: 316 10*3/uL (ref 150–400)
RBC: 4.38 MIL/uL (ref 3.87–5.11)
RDW: 12.4 % (ref 11.5–15.5)
WBC: 5.1 10*3/uL (ref 4.0–10.5)
nRBC: 0 % (ref 0.0–0.2)

## 2019-08-07 LAB — SARS CORONAVIRUS 2 (TAT 6-24 HRS): SARS Coronavirus 2: NEGATIVE

## 2019-08-07 MED ORDER — POTASSIUM CHLORIDE 20 MEQ PO PACK: 20.0000 meq | PACK | Freq: Every day | ORAL | 0 refills | Status: DC

## 2019-08-07 NOTE — Telephone Encounter (Signed)
Notified patient of script sent to pharmacy. 

## 2019-08-07 NOTE — Telephone Encounter (Signed)
-----   Message from Billey Co, MD sent at 08/07/2019  1:20 PM EST ----- Please replace her K+ with potassium chloride packet 11mEq daily x 5 days? Thanks  Nickolas Madrid, MD 08/07/2019

## 2019-08-08 ENCOUNTER — Ambulatory Visit
Admission: RE | Admit: 2019-08-08 | Discharge: 2019-08-08 | Disposition: A | Discharge status: Home / Self Care | Payer: Managed Care, Other (non HMO) | Source: Ambulatory Visit | Attending: Urology | Admitting: Urology

## 2019-08-08 ENCOUNTER — Other Ambulatory Visit: Payer: Self-pay | Admitting: Radiology

## 2019-08-08 ENCOUNTER — Telehealth: Payer: Self-pay

## 2019-08-08 ENCOUNTER — Ambulatory Visit: Admission: RE | Admit: 2019-08-08 | Payer: Managed Care, Other (non HMO) | Source: Ambulatory Visit

## 2019-08-08 ENCOUNTER — Other Ambulatory Visit: Payer: Self-pay | Admitting: Urology

## 2019-08-08 DIAGNOSIS — Z01811 Encounter for preprocedural respiratory examination: Secondary | ICD-10-CM

## 2019-08-08 NOTE — Telephone Encounter (Signed)
Just completed. Ready to be faxed. Left on Suli's desk

## 2019-08-08 NOTE — Telephone Encounter (Signed)
Copied from [place] [id number]. Topic: General - Inquiry >> Aug 08, 2019  8:41 AM Scherrie Gerlach wrote: Reason for CRM: Ann with PAT has faxed the pt's EKG and the dr would like Dr B to look at.  If she feels it is OK, just sing and fax back

## 2019-08-08 NOTE — Telephone Encounter (Signed)
Have you received an EKG for this patient?

## 2019-08-08 NOTE — Pre-Procedure Instructions (Signed)
EKG faxed to Dr Brita Romp for clearance per Dr Ola Spurr. Message sent to Amy Heidel at Dr [facility] office re: clearance request and also that pt did not stop for her Chest xray. Unable to make contact with patient this am will continue contact patient.

## 2019-08-08 NOTE — Telephone Encounter (Signed)
faxed

## 2019-08-09 MED ORDER — CIPROFLOXACIN IN D5W 400 MG/200ML IV SOLN
400.0000 mg | INTRAVENOUS | Status: AC
  Administered 2019-08-10: 400 mg via INTRAVENOUS

## 2019-08-10 ENCOUNTER — Ambulatory Visit: Payer: Managed Care, Other (non HMO) | Admitting: Anesthesiology

## 2019-08-10 ENCOUNTER — Encounter
Admission: RE | Disposition: A | Discharge status: Home / Self Care | Payer: Self-pay | Source: Home / Self Care | Attending: Urology

## 2019-08-10 ENCOUNTER — Ambulatory Visit
Admission: RE | Admit: 2019-08-10 | Discharge: 2019-08-10 | Disposition: A | Discharge status: Home / Self Care | Payer: Managed Care, Other (non HMO) | Attending: Urology | Admitting: Urology

## 2019-08-10 ENCOUNTER — Other Ambulatory Visit: Payer: Self-pay

## 2019-08-10 ENCOUNTER — Encounter: Payer: Self-pay | Admitting: Urology

## 2019-08-10 DIAGNOSIS — Z8249 Family history of ischemic heart disease and other diseases of the circulatory system: Secondary | ICD-10-CM | POA: Insufficient documentation

## 2019-08-10 DIAGNOSIS — E669 Obesity, unspecified: Secondary | ICD-10-CM | POA: Insufficient documentation

## 2019-08-10 DIAGNOSIS — C674 Malignant neoplasm of posterior wall of bladder: Secondary | ICD-10-CM | POA: Insufficient documentation

## 2019-08-10 DIAGNOSIS — Z6835 Body mass index (BMI) 35.0-35.9, adult: Secondary | ICD-10-CM | POA: Insufficient documentation

## 2019-08-10 DIAGNOSIS — M47896 Other spondylosis, lumbar region: Secondary | ICD-10-CM | POA: Diagnosis not present

## 2019-08-10 DIAGNOSIS — D494 Neoplasm of unspecified behavior of bladder: Secondary | ICD-10-CM

## 2019-08-10 DIAGNOSIS — I10 Essential (primary) hypertension: Secondary | ICD-10-CM | POA: Insufficient documentation

## 2019-08-10 DIAGNOSIS — Z880 Allergy status to penicillin: Secondary | ICD-10-CM | POA: Diagnosis not present

## 2019-08-10 DIAGNOSIS — Z01818 Encounter for other preprocedural examination: Secondary | ICD-10-CM

## 2019-08-10 HISTORY — PX: TRANSURETHRAL RESECTION OF BLADDER TUMOR WITH MITOMYCIN-C: SHX6459

## 2019-08-10 LAB — POCT I-STAT, CHEM 8
BUN: 11 mg/dL (ref 6–20)
Calcium, Ion: 1.24 mmol/L (ref 1.15–1.40)
Chloride: 104 mmol/L (ref 98–111)
Creatinine, Ser: 0.8 mg/dL (ref 0.44–1.00)
Glucose, Bld: 90 mg/dL (ref 70–99)
HCT: 35 % — ABNORMAL LOW (ref 36.0–46.0)
Hemoglobin: 11.9 g/dL — ABNORMAL LOW (ref 12.0–15.0)
Potassium: 4 mmol/L (ref 3.5–5.1)
Sodium: 139 mmol/L (ref 135–145)
TCO2: 26 mmol/L (ref 22–32)

## 2019-08-10 SURGERY — TRANSURETHRAL RESECTION OF BLADDER TUMOR WITH MITOMYCIN-C
Anesthesia: General | Site: Bladder

## 2019-08-10 MED ORDER — PROPOFOL 10 MG/ML IV BOLUS
INTRAVENOUS | Status: AC
  Filled 2019-08-10: qty 20

## 2019-08-10 MED ORDER — FAMOTIDINE 20 MG PO TABS
ORAL_TABLET | ORAL | Status: AC
  Administered 2019-08-10: 20 mg via ORAL
  Filled 2019-08-10: qty 1

## 2019-08-10 MED ORDER — ROCURONIUM BROMIDE 50 MG/5ML IV SOLN
INTRAVENOUS | Status: AC
  Filled 2019-08-10: qty 1

## 2019-08-10 MED ORDER — GEMCITABINE CHEMO FOR BLADDER INSTILLATION 2000 MG
INTRAVENOUS | Status: DC | PRN
  Administered 2019-08-10: 2000 mg via INTRAVESICAL

## 2019-08-10 MED ORDER — SUGAMMADEX SODIUM 500 MG/5ML IV SOLN
INTRAVENOUS | Status: AC
  Filled 2019-08-10: qty 5

## 2019-08-10 MED ORDER — FENTANYL CITRATE (PF) 100 MCG/2ML IJ SOLN
INTRAMUSCULAR | Status: DC | PRN
  Administered 2019-08-10: 100 ug via INTRAVENOUS

## 2019-08-10 MED ORDER — FENTANYL CITRATE (PF) 100 MCG/2ML IJ SOLN
INTRAMUSCULAR | Status: AC
  Filled 2019-08-10: qty 2

## 2019-08-10 MED ORDER — PHENYLEPHRINE HCL (PRESSORS) 10 MG/ML IV SOLN
INTRAVENOUS | Status: AC
  Filled 2019-08-10: qty 1

## 2019-08-10 MED ORDER — LIDOCAINE HCL (CARDIAC) PF 100 MG/5ML IV SOSY
PREFILLED_SYRINGE | INTRAVENOUS | Status: DC | PRN
  Administered 2019-08-10: 100 mg via INTRAVENOUS

## 2019-08-10 MED ORDER — ONDANSETRON HCL 4 MG/2ML IJ SOLN
INTRAMUSCULAR | Status: AC
  Filled 2019-08-10: qty 2

## 2019-08-10 MED ORDER — SUGAMMADEX SODIUM 500 MG/5ML IV SOLN
INTRAVENOUS | Status: DC | PRN
  Administered 2019-08-10: 200 mg via INTRAVENOUS

## 2019-08-10 MED ORDER — ONDANSETRON HCL 4 MG/2ML IJ SOLN
INTRAMUSCULAR | Status: DC | PRN
  Administered 2019-08-10: 4 mg via INTRAVENOUS

## 2019-08-10 MED ORDER — OXYCODONE HCL 5 MG PO TABS: 5.0000 mg | ORAL_TABLET | Freq: Once | ORAL | Status: DC | PRN

## 2019-08-10 MED ORDER — PROPOFOL 10 MG/ML IV BOLUS
INTRAVENOUS | Status: DC | PRN
  Administered 2019-08-10: 140 mg via INTRAVENOUS
  Administered 2019-08-10: 20 mg via INTRAVENOUS

## 2019-08-10 MED ORDER — CIPROFLOXACIN IN D5W 400 MG/200ML IV SOLN
INTRAVENOUS | Status: AC
  Filled 2019-08-10: qty 200

## 2019-08-10 MED ORDER — FENTANYL CITRATE (PF) 100 MCG/2ML IJ SOLN: 25.0000 ug | INTRAMUSCULAR | Status: DC | PRN

## 2019-08-10 MED ORDER — MIDAZOLAM HCL 2 MG/2ML IJ SOLN
INTRAMUSCULAR | Status: AC
  Filled 2019-08-10: qty 2

## 2019-08-10 MED ORDER — LIDOCAINE HCL (PF) 2 % IJ SOLN
INTRAMUSCULAR | Status: AC
  Filled 2019-08-10: qty 5

## 2019-08-10 MED ORDER — DEXAMETHASONE SODIUM PHOSPHATE 10 MG/ML IJ SOLN
INTRAMUSCULAR | Status: AC
  Filled 2019-08-10: qty 1

## 2019-08-10 MED ORDER — FAMOTIDINE 20 MG PO TABS: 20.0000 mg | ORAL_TABLET | Freq: Once | ORAL | Status: AC

## 2019-08-10 MED ORDER — OXYCODONE HCL 5 MG/5ML PO SOLN: 5.0000 mg | Freq: Once | ORAL | Status: DC | PRN

## 2019-08-10 MED ORDER — GEMCITABINE CHEMO FOR BLADDER INSTILLATION 2000 MG: 2000.0000 mg | Freq: Once | INTRAVENOUS | Status: DC

## 2019-08-10 MED ORDER — HYDROCODONE-ACETAMINOPHEN 5-325 MG PO TABS: 1.0000 | ORAL_TABLET | ORAL | 0 refills | Status: AC | PRN

## 2019-08-10 MED ORDER — ROCURONIUM BROMIDE 100 MG/10ML IV SOLN
INTRAVENOUS | Status: DC | PRN
  Administered 2019-08-10: 50 mg via INTRAVENOUS

## 2019-08-10 MED ORDER — MIDAZOLAM HCL 2 MG/2ML IJ SOLN
INTRAMUSCULAR | Status: DC | PRN
  Administered 2019-08-10: 2 mg via INTRAVENOUS

## 2019-08-10 MED ORDER — PHENYLEPHRINE HCL (PRESSORS) 10 MG/ML IV SOLN
INTRAVENOUS | Status: DC | PRN
  Administered 2019-08-10: 100 ug via INTRAVENOUS
  Administered 2019-08-10: 200 ug via INTRAVENOUS
  Administered 2019-08-10 (×3): 100 ug via INTRAVENOUS

## 2019-08-10 MED ORDER — LACTATED RINGERS IV SOLN
INTRAVENOUS | Status: DC
  Administered 2019-08-10: 50 mL/h via INTRAVENOUS

## 2019-08-10 MED ORDER — DEXAMETHASONE SODIUM PHOSPHATE 10 MG/ML IJ SOLN
INTRAMUSCULAR | Status: DC | PRN
  Administered 2019-08-10: 10 mg via INTRAVENOUS

## 2019-08-10 SURGICAL SUPPLY — 27 items
BAG DRAIN CYSTO-URO LG1000N (MISCELLANEOUS) ×3 IMPLANT
BAG URINE DRAIN 2000ML AR STRL (UROLOGICAL SUPPLIES) ×3 IMPLANT
BRUSH SCRUB EZ  4% CHG (MISCELLANEOUS) ×2
BRUSH SCRUB EZ 4% CHG (MISCELLANEOUS) ×1 IMPLANT
CATH FOL 2WAY LX 18X30 (CATHETERS) ×3 IMPLANT
CRADLE LAMINECT ARM (MISCELLANEOUS) ×3 IMPLANT
DRAPE UTILITY 15X26 TOWEL STRL (DRAPES) ×3 IMPLANT
DRSG TELFA 4X3 1S NADH ST (GAUZE/BANDAGES/DRESSINGS) ×3 IMPLANT
ELECT LOOP 22F BIPOLAR SML (ELECTROSURGICAL)
ELECT REM PT RETURN 9FT ADLT (ELECTROSURGICAL)
ELECTRODE LOOP 22F BIPOLAR SML (ELECTROSURGICAL) IMPLANT
ELECTRODE REM PT RTRN 9FT ADLT (ELECTROSURGICAL) IMPLANT
GLOVE BIOGEL PI IND STRL 7.5 (GLOVE) ×1 IMPLANT
GLOVE BIOGEL PI INDICATOR 7.5 (GLOVE) ×2
GOWN STRL REUS W/ TWL LRG LVL3 (GOWN DISPOSABLE) ×1 IMPLANT
GOWN STRL REUS W/ TWL XL LVL3 (GOWN DISPOSABLE) ×1 IMPLANT
GOWN STRL REUS W/TWL LRG LVL3 (GOWN DISPOSABLE) ×2
GOWN STRL REUS W/TWL XL LVL3 (GOWN DISPOSABLE) ×2
KIT TURNOVER CYSTO (KITS) ×3 IMPLANT
LOOP CUT BIPOLAR 24F LRG (ELECTROSURGICAL) ×2 IMPLANT
NDL SAFETY ECLIPSE 18X1.5 (NEEDLE) ×1 IMPLANT
NEEDLE HYPO 18GX1.5 SHARP (NEEDLE) ×2
PACK CYSTO AR (MISCELLANEOUS) ×3 IMPLANT
SET IRRIG Y TYPE TUR BLADDER L (SET/KITS/TRAYS/PACK) ×3 IMPLANT
SURGILUBE 2OZ TUBE FLIPTOP (MISCELLANEOUS) ×3 IMPLANT
SYRINGE IRR TOOMEY STRL 70CC (SYRINGE) ×3 IMPLANT
WATER STERILE IRR 1000ML POUR (IV SOLUTION) ×3 IMPLANT

## 2019-08-10 NOTE — Transfer of Care (Signed)
Immediate Anesthesia Transfer of Care Note  Patient: Christina Holloway  Procedure(s) Performed: TRANSURETHRAL RESECTION OF BLADDER TUMOR WITH Gemcitabine (N/A Bladder)  Patient Location: PACU  Anesthesia Type:General  Level of Consciousness: awake, alert  and oriented  Airway & Oxygen Therapy: Patient Spontanous Breathing and Patient connected to face mask oxygen  Post-op Assessment: Report given to RN and Post -op Vital signs reviewed and stable  Post vital signs: Reviewed and stable  Last Vitals:  Vitals Value Taken Time  BP 139/83 08/10/19 1230  Temp    Pulse    Resp 13 08/10/19 1232  SpO2    Vitals shown include unvalidated device data.  Last Pain:  Vitals:   08/10/19 1022  TempSrc: Tympanic  PainSc: 0-No pain         Complications: No apparent anesthesia complications

## 2019-08-10 NOTE — Anesthesia Procedure Notes (Signed)
Procedure Name: Intubation Date/Time: 08/10/2019 11:38 AM Performed by: Rona Ravens, CRNA Pre-anesthesia Checklist: Patient identified, Emergency Drugs available, Suction available, Patient being monitored and Timeout performed Patient Re-evaluated:Patient Re-evaluated prior to induction Oxygen Delivery Method: Circle system utilized Preoxygenation: Pre-oxygenation with 100% oxygen Induction Type: IV induction Ventilation: Mask ventilation without difficulty Laryngoscope Size: Mac and 3 Grade View: Grade II Tube type: Oral Tube size: 7.0 mm Number of attempts: 1 Airway Equipment and Method: Stylet Placement Confirmation: ETT inserted through vocal cords under direct vision,  positive ETCO2,  CO2 detector and breath sounds checked- equal and bilateral Secured at: 22 cm Tube secured with: Tape

## 2019-08-10 NOTE — Anesthesia Post-op Follow-up Note (Signed)
Anesthesia QCDR form completed.        

## 2019-08-10 NOTE — Anesthesia Postprocedure Evaluation (Signed)
Anesthesia Post Note  Patient: Christina Holloway  Procedure(s) Performed: TRANSURETHRAL RESECTION OF BLADDER TUMOR WITH Gemcitabine (N/A Bladder)  Patient location during evaluation: PACU Anesthesia Type: General Level of consciousness: awake and alert Pain management: pain level controlled Vital Signs Assessment: post-procedure vital signs reviewed and stable Respiratory status: spontaneous breathing, nonlabored ventilation and respiratory function stable Cardiovascular status: blood pressure returned to baseline and stable Postop Assessment: no apparent nausea or vomiting Anesthetic complications: no     Last Vitals:  Vitals:   08/10/19 1344 08/10/19 1400  BP: (!) 192/86 (!) 156/74  Pulse: (!) 52 (!) 56  Resp: 14 17  Temp: (!) 36.3 C (!) 36.4 C  SpO2: 100% 100%    Last Pain:  Vitals:   08/10/19 1400  TempSrc:   PainSc: 0-No pain                 Brett Canales Keene Gilkey

## 2019-08-10 NOTE — H&P (Signed)
UROLOGY H&P UPDATE  Agree with prior H&P dated 08/02/2019. Gross hematuria and 2.5cm bladder tumor on cystoscopy and CTU, no evidence of metastatic disease.  Cardiac: RRR Lungs: CTA bilaterally  Laterality: N/A Procedure: TURBT w/gemcitabine  Urine: urine cx 12/10 mixed flora  We discussed transurethral resection of bladder tumor (TURBT) and risks and benefits at length. This is typically a 1 to 2-hour procedure done under general anesthesia in the operating room.  A scope is inserted through the urethra and used to resect abnormal tissue within the bladder, which is then sent to the pathologist to determine grade and stage of the tumor.  Risks include bleeding, infection, need for temporary Foley placement, and bladder perforation.  Treatment strategies are based on the type of tumor and depth of invasion.  We briefly reviewed the different treatment pathways for non-muscle invasive and muscle invasive bladder cancer.   Billey Co, MD 08/10/2019

## 2019-08-10 NOTE — Op Note (Signed)
Date of procedure: 08/10/19  Preoperative diagnosis:  1. Bladder tumor, 3 cm  Postoperative diagnosis:  1. Same  Procedure: 1. Cystoscopy, transurethral resection of bladder tumor ~3cm 2. Instillation of gemcitabine  Surgeon: Nickolas Madrid, MD  Anesthesia: General  Complications: None  Intraoperative findings:  1.  Ureteral orifices orthotopic bilaterally 2.  Irregular and papillary appearing bladder tumor at the right posterior bladder wall, 3 cm in size 3.  Tumor well away from ureteral orifices, no other tumors in the bladder  EBL: Minimal  Specimens:  1.  Bladder tumor superficial 2.  Bladder tumor deep  Drains: 30 French two-way Foley  Indication: Christina Holloway is a 58 y.o. patient with gross hematuria who was found to have a 3 cm bladder tumor on CT urogram in clinic cystoscopy with no evidence of metastatic disease.  After reviewing the management options for treatment, they elected to proceed with the above surgical procedure(s). We have discussed the potential benefits and risks of the procedure, side effects of the proposed treatment, the likelihood of the patient achieving the goals of the procedure, and any potential problems that might occur during the procedure or recuperation. Informed consent has been obtained.  Description of procedure:  The patient was taken to the operating room and general anesthesia was induced. SCDs were placed for DVT prophylaxis. The patient was placed in the dorsal lithotomy position, prepped and draped in the usual sterile fashion, and preoperative antibiotics(Ancef) were administered. A preoperative time-out was performed.   A 26 French resectoscope was used to intubate the meatus using the visual obturator.  Thorough cystoscopy was performed.  The ureteral orifices were orthotopic bilaterally and well away from the tumor.  There was a single 3 cm papillary and irregular appearing bladder tumor at the right posterior bladder wall.  The  large resecting loop was used to methodically remove all abnormal appearing tissue.  The cold cup biopsy forcep was used to take multiple deep bites of muscle at the base of the tumor.  The resecting loop was then used to methodically fulgurate the base of the tumor.  There was no residual tumor at the conclusion of the procedure.  There was no bleeding with the bladder decompressed.  An 46 French Foley was placed with return of clear fluid.  Disposition: Stable to PACU  Plan: 2 g/33ml gemcitabine was instilled into the Foley catheter, and will be drained at 1:20 PM Foley catheter can be removed prior to discharge Follow-up in 10 days to discuss pathology  Nickolas Madrid, MD

## 2019-08-10 NOTE — Discharge Instructions (Signed)

## 2019-08-10 NOTE — Anesthesia Preprocedure Evaluation (Signed)
Anesthesia Evaluation  Patient identified by MRN, date of birth, ID band Patient awake    Reviewed: Allergy & Precautions, H&P , NPO status , Patient's Chart, lab work & pertinent test results  Airway Mallampati: II  TM Distance: >3 FB Neck ROM: full    Dental  (+) Edentulous Lower, Edentulous Upper   Pulmonary neg pulmonary ROS, neg COPD,           Cardiovascular hypertension, (-) angina(-) Past MI (-) dysrhythmias      Neuro/Psych negative neurological ROS  negative psych ROS   GI/Hepatic negative GI ROS, Neg liver ROS,   Endo/Other  negative endocrine ROS  Renal/GU      Musculoskeletal   Abdominal   Peds  Hematology  (+) Blood dyscrasia, anemia ,   Anesthesia Other Findings Obesity  Past Medical History: 05/02/2015: Abnormal LFTs 08/24/2003: Anemia, iron deficiency     Comment:  in past No date: Arthritis     Comment:  lower back, legs 05/02/2015: Calcium blood increased No date: Cancer ([place]) 05/02/2015: Decreased potassium in the blood 08/24/2003: Essential (primary) hypertension 05/02/2015: Gout 04/29/2009: Hypercholesterolemia without hypertriglyceridemia 05/02/2015: Malaise and fatigue No date: Shortness of breath dyspnea     Comment:  1 flight stairs No date: Wears dentures     Comment:  full upper, partial lower  Past Surgical History: 05/08/2015: COLONOSCOPY WITH PROPOFOL; N/A     Comment:  Procedure: COLONOSCOPY WITH PROPOFOL;  Surgeon: Lucilla Lame, MD;  Location: [facility];  Service:               Endoscopy;  Laterality: N/A; 05/30/2018: COLONOSCOPY WITH PROPOFOL; N/A     Comment:  Procedure: COLONOSCOPY WITH PROPOFOL;  Surgeon: Lin Landsman, MD;  Location: [facility];  Service:               Gastroenterology;  Laterality: N/A; No date: ENDOMETRIAL ABLATION 05/08/2015: POLYPECTOMY     Comment:  Procedure: POLYPECTOMY;  Surgeon: Lucilla Lame, MD;      Location: [facility];  Service: Endoscopy;; No date: VAGINAL HYSTERECTOMY  BMI    Body Mass Index: 35.40 kg/m      Reproductive/Obstetrics negative OB ROS                             Anesthesia Physical Anesthesia Plan  ASA: II  Anesthesia Plan: General ETT   Post-op Pain Management:    Induction:   PONV Risk Score and Plan: Ondansetron, Dexamethasone, Midazolam and Treatment may vary due to age or medical condition  Airway Management Planned:   Additional Equipment:   Intra-op Plan:   Post-operative Plan:   Informed Consent: I have reviewed the patients History and Physical, chart, labs and discussed the procedure including the risks, benefits and alternatives for the proposed anesthesia with the patient or authorized representative who has indicated his/her understanding and acceptance.     Dental Advisory Given  Plan Discussed with: Anesthesiologist  Anesthesia Plan Comments:         Anesthesia Quick Evaluation

## 2019-08-13 LAB — SURGICAL PATHOLOGY

## 2019-08-20 ENCOUNTER — Telehealth: Payer: Self-pay | Admitting: Urology

## 2019-08-20 NOTE — Telephone Encounter (Signed)
Yes ok to return to work, no restrictions  Thanks Nickolas Madrid, MD 08/20/2019

## 2019-08-20 NOTE — Telephone Encounter (Signed)
Patient is asking when she can return to work and wants a work note   thanks

## 2019-08-20 NOTE — Telephone Encounter (Signed)
Letter sent via my chart

## 2019-08-22 ENCOUNTER — Encounter: Payer: Self-pay | Admitting: Urology

## 2019-08-22 ENCOUNTER — Ambulatory Visit (INDEPENDENT_AMBULATORY_CARE_PROVIDER_SITE_OTHER): Payer: Managed Care, Other (non HMO) | Admitting: Urology

## 2019-08-22 ENCOUNTER — Other Ambulatory Visit: Payer: Self-pay

## 2019-08-22 VITALS — BP 200/116 | HR 71 | Ht 67.0 in | Wt 235.0 lb

## 2019-08-22 DIAGNOSIS — C674 Malignant neoplasm of posterior wall of bladder: Secondary | ICD-10-CM | POA: Diagnosis not present

## 2019-08-22 NOTE — Progress Notes (Signed)
   08/22/2019 2:55 PM   Christina Holloway 11/24/1960 [id number]  Reason for visit: Discuss pathology from TURBT  HPI: I saw [name] back in urology clinic to discuss pathology from her TURBT.  Briefly, she is a healthy 58 year old female that underwent a TURBT for a 3 cm bladder tumor on 08/10/2019.  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  She has been doing well since surgery and denies any complaints.  She has had some intermittent pink urine, but denies any abdominal pain or difficulty with urination.  We a long conversation today about her new diagnosis of bladder cancer.  We discussed the differences between low-grade and high-grade disease, as well as nonmuscle invasive bladder cancer versus muscle invasive bladder cancer.  We discussed treatment strategies at length based on disease staging.  We discussed the next step would be a repeat TURBT 4 to 6 weeks from her original resection per the AUA guidelines.  Reviewed the possibility of residual T1 disease versus upstaging to T2 muscle invasive disease.  If she remains non-muscle invasive would pursue induction and maintenance BCG to decrease risk of recurrence, and we discussed this at length.  Finally, we discussed the need for high risk of recurrence, and close need for long-term surveillance with cystoscopy every 3 to 4 months for the first 2 years, then less frequently in the future.  Schedule second look TURBT in ~3-4 weeks  A total of 30 minutes were spent face-to-face with the patient, greater than 50% was spent in patient education, counseling, and coordination of care regarding new diagnosis of bladder cancer and treatment options.  Billey Co, [facility] [address], [place], [place] [address] [phone]

## 2019-08-22 NOTE — Patient Instructions (Signed)
Bladder Cancer  Bladder cancer is an abnormal growth of tissue in the bladder. The bladder is the balloon-like sac in the pelvis. It collects and stores urine that comes from the kidneys through the ureters. The bladder wall is made of layers. If cancer spreads into these layers and through the wall of the bladder, it becomes more difficult to treat. What are the causes? The cause of this condition is not known. What increases the risk? The following factors may make you more likely to develop this condition:  Smoking.  Workplace risks (occupational exposures), such as rubber, leather, textile, dyes, chemicals, and paint.  Being white.  Your age. Most people with bladder cancer are over the age of 55.  Being female.  Having chronic bladder inflammation.  Having a personal history of bladder cancer.  Having a family history of bladder cancer (heredity).  Having had chemotherapy or radiation therapy to the pelvis.  Having been exposed to arsenic. What are the signs or symptoms? Initial symptoms of this condition include:  Blood in the urine.  Painful urination.  Frequent bladder or urine infections.  Increase in urgency and frequency of urination. Advanced symptoms of this condition include:  Not being able to urinate.  Low back pain on one side.  Loss of appetite.  Weight loss.  Fatigue.  Swelling in the feet.  Bone pain. How is this diagnosed? This condition is diagnosed based on your medical history, a physical exam, urine tests, lab tests, imaging tests, and your symptoms. You may also have other tests or procedures done, such as:  A narrow tube being inserted into your bladder through your urethra (cystoscopy) in order to view the lining of your bladder for tumors.  A biopsy to sample the tumor to see if cancer is present. If cancer is present, it will then be staged to determine its severity and extent. Staging is an assessment of:  The size of the  tumor.  Whether the cancer has spread.  Where the cancer has spread. It is important to know how deeply into the bladder wall cancer has grown and whether cancer has spread to any other parts of your body. Staging may require blood tests or imaging tests, such as a CT scan, MRI, bone scan, or chest X-ray. How is this treated? Based on the stage of cancer, one treatment or a combination of treatments may be recommended. The most common forms of treatment are:  Surgery to remove the cancer. Procedures that may be done include transurethral resection and cystectomy.  Radiation therapy. This is high-energy X-rays or other particles. This is often used in combination with chemotherapy.  Chemotherapy. During this treatment, medicines are used to kill cancer cells.  Immunotherapy. This uses medicines to help your own immune system destroy cancer cells. Follow these instructions at home:  Take over-the-counter and prescription medicines only as told by your health care provider.  Maintain a healthy diet. Some of your treatments might affect your appetite.  Consider joining a support group. This may help you learn to cope with the stress of having bladder cancer.  Tell your cancer care team if you develop side effects. They may be able to recommend ways to relieve them.  Keep all follow-up visits as told by your health care provider. This is important. Where to find more information  American Cancer Society: www.cancer.org  National Cancer Institute (NCI): www.cancer.gov Contact a health care provider if:  You have symptoms of a urinary tract infection. These include: ?   Fever. ? Chills. ? Weakness. ? Muscle aches. ? Abdominal pain. ? Frequent and intense urge to urinate. ? Burning feeling in the bladder or urethra during urination. Get help right away if:  There is blood in your urine.  You cannot urinate.  You have severe pain or other symptoms that do not go  away. Summary  Bladder cancer is an abnormal growth of tissue in the bladder.  This condition is diagnosed based on your medical history, a physical exam, urine tests, lab tests, imaging tests, and your symptoms.  Based on the stage of cancer, surgery, chemotherapy, or a combination of treatments may be recommended.  Consider joining a support group. This may help you learn to cope with the stress of having bladder cancer. This information is not intended to replace advice given to you by your health care provider. Make sure you discuss any questions you have with your health care provider. Document Released: 08/12/2003 Document Revised: 07/22/2017 Document Reviewed: 07/13/2016 Elsevier Patient Education  2020 [employer].   Transurethral Resection of Bladder Tumor  Transurethral resection of a bladder tumor is the removal (resection) of a cancerous growth (tumor) on the inside wall of the bladder. The bladder is the organ that holds urine. The tumor is removed through the tube that carries urine out of the body (urethra). In a transurethral resection, a thin telescope with a light, a tiny camera, and an electric cutting edge (resectoscope) is passed through the urethra. In men, the opening of the urethra is at the end of the penis. In women, it is just above the opening of the vagina. Tell a health care provider about:  Any allergies you have.  All medicines you are taking, including vitamins, herbs, eye drops, creams, and over-the-counter medicines.  Any problems you or family members have had with anesthetic medicines.  Any blood disorders you have.  Any surgeries you have had.  Any medical conditions you have.  Any recent urinary tract infections you have had.  Whether you are pregnant or may be pregnant. What are the risks? Generally, this is a safe procedure. However, problems may occur, including:  Infection.  Bleeding.  Allergic reactions to medicines.  Damage  to nearby structures or organs, such as: ? The urethra. ? The tubes that drain urine from the kidneys into the bladder (ureters).  Pain and burning during urination.  Difficulty urinating due to partial blockage of the urethra.  Inability to urinate (urinary retention). What happens before the procedure? Staying hydrated Follow instructions from your health care provider about hydration, which may include:  Up to 2 hours before the procedure - you may continue to drink clear liquids, such as water, clear fruit juice, black coffee, and plain tea.  Eating and drinking restrictions Follow instructions from your health care provider about eating and drinking, which may include:  8 hours before the procedure - stop eating heavy meals or foods, such as meat, fried foods, or fatty foods.  6 hours before the procedure - stop eating light meals or foods, such as toast or cereal.  6 hours before the procedure - stop drinking milk or drinks that contain milk.  2 hours before the procedure - stop drinking clear liquids. Medicines Ask your health care provider about:  Changing or stopping your regular medicines. This is especially important if you are taking diabetes medicines or blood thinners.  Taking medicines such as aspirin and ibuprofen. These medicines can thin your blood. Do not take these medicines unless  your health care provider tells you to take them.  Taking over-the-counter medicines, vitamins, herbs, and supplements. Tests You may have exams or tests, including:  Physical exam.  Blood tests.  Urine tests.  Electrocardiogram (ECG). This test measures the electrical activity of the heart. General instructions  Plan to have someone take you home from the hospital or clinic.  Ask your health care provider how your surgical site will be marked or identified.  Ask your health care provider what steps will be taken to help prevent infection. These may include: ? Washing  skin with a germ-killing soap. ? Taking antibiotic medicine. What happens during the procedure?  An IV will be inserted into one of your veins.  You will be given one or more of the following: ? A medicine to help you relax (sedative). ? A medicine to make you fall asleep (general anesthetic). ? A medicine that is injected into your spine to numb the area below and slightly above the injection site (spinal anesthetic).  Your legs will be placed in foot rests (stirrups) so that your legs are apart and your knees are bent.  The resectoscope will be passed through your urethra and into your bladder.  The part of your bladder that is affected by the tumor will be resected using the cutting edge of the resectoscope.  The resectoscope will be removed.  A thin, flexible tube (catheter) will be passed through your urethra and into your bladder. The catheter will drain urine into a bag outside of your body. ? Fluid may be passed through the catheter to keep the catheter open. The procedure may vary among health care providers and hospitals. What happens after the procedure?  Your blood pressure, heart rate, breathing rate, and blood oxygen level will be monitored until you leave the hospital or clinic.  You may continue to receive fluids and medicines through an IV.  You will have some pain. You will be given pain medicine to relieve pain.  You will have a catheter to drain your urine. ? You will have blood in your urine. Your catheter may be kept in until your urine is clear. ? The amount of urine will be monitored. If necessary, your bladder may be rinsed out (irrigated) by passing fluid through your catheter.  You will be encouraged to walk around as soon as possible.  You may have to wear compression stockings. These stockings help to prevent blood clots and reduce swelling in your legs.  Do not drive for 24 hours if you were given a sedative during your  procedure. Summary  Transurethral resection of a bladder tumor is the removal (resection) of a cancerous growth (tumor) on the inside wall of the bladder.  To do this procedure, your health care provider uses a thin telescope with a light, a tiny camera, and an electric cutting edge (resectoscope).  Follow your health care provider's instructions. You may need to stop or change certain medicines, and you may be told to stop eating and drinking several hours before the procedure.  Your blood pressure, heart rate, breathing rate, and blood oxygen level will be monitored until you leave the hospital or clinic.  You may have to wear compression stockings. These stockings help to prevent blood clots and reduce swelling in your legs. This information is not intended to replace advice given to you by your health care provider. Make sure you discuss any questions you have with your health care provider. Document Released: 06/05/2009 Document Revised: 03/10/2018  Document Reviewed: 03/10/2018 Elsevier Patient Education  [place] Calmette-Guerin Live, BCG intravesical solution What is this medicine? BACILLUS CALMETTE-GUERIN LIVE, BCG (ba SIL [place] KAL met gay RAYN) is a bacteria solution. This medicine stimulates the immune system to ward off cancer cells. It is used to treat bladder cancer. This medicine may be used for other purposes; ask your health care provider or pharmacist if you have questions. COMMON BRAND NAME(S): Theracys, TICE BCG What should I tell my health care provider before I take this medicine? They need to know if you have any of these conditions:  aneurysm  blood in the urine  bladder biopsy within 2 weeks  fever or infection  immune system problems  leukemia  lymphoma  myasthenia gravis  need organ transplant  prosthetic device like arterial graft, artificial joint, prosthetic heart valve  recent or ongoing radiation  therapy  tuberculosis  an unusual or allergic reaction to Bacillus Calmette-Guerin Live, BCG, latex, other medicines, foods, dyes, or preservatives  pregnant or trying to get pregnant  breast-feeding How should I use this medicine? This drug is given as a catheter infusion into the bladder. It is administered in a hospital or clinic by a specially trained health care provider. You will be given directions to follow before the treatment. Follow your health care provider's directions carefully. This medicine contains live bacteria. It is very important to follow these directions closely after treatment to prevent others from coming in contact with your urine. Your health care provider may give you additional directions to follow. Try to hold this medicine in your bladder for 1 to 2 hours. Follow these directions the first time you go to the bathroom and for 6 hours after the first void.  Wash your hands before using the restroom. After voiding, wash your hands and genital area.  Use a toilet and sit when going to the bathroom. This helps to prevent the urine from splashing. Do not use public toilets or void outside.  After each void, add 2 cups of undiluted bleach to the toilet. Close the lid. Wait 15 to 20 minutes and then flush the toilet.  After the first void, drink more fluids to help dilute your urine.  If you have urinary incontinence, wash the clothes you were wearing in the washer immediately. Do not wash other clothes at the same time.  If you are wearing an incontinence pad, pour bleach on the pad and allow it to soak into the pad before throwing it away. Put the pad in a plastic bag and put it in the trash. Talk to your pediatrician regarding the use of this medicine in children. Special care may be needed. Overdosage: If you think you have taken too much of this medicine contact a poison control center or emergency room at once. NOTE: This medicine is only for you. Do not share  this medicine with others. What if I miss a dose? It is important not to miss your dose. Call your doctor or health care professional if you are unable to keep an appointment. What may interact with this medicine?  antibiotics  medicines to suppress your immune system like chemotherapy agents or corticosteroids  medicine to treat tuberculosis This list may not describe all possible interactions. Give your health care provider a list of all the medicines, herbs, non-prescription drugs, or dietary supplements you use. Also tell them if you smoke, drink alcohol, or use illegal drugs. Some items may interact with your medicine. What  should I watch for while using this medicine? Visit your health care provider for checks on your progress. This medicine may make you feel generally unwell. Contact your health care provider if your symptoms last more than 2 days or if they get worse. Call your health care provider right away if you have a severe or unusual symptom. Infection can be spread to others through contact with this medicine. To prevent the spread of infection, follow your health care provider's directions carefully after treatment. Do not become pregnant while taking this medicine. There is a potential for serious side effects to an unborn child. Talk to your health care provider for more information. Do not breast-feed an infant while taking this medicine. If you have sex while on this medicine, use a condom. Ask your health care provider how long you should use a condom. What side effects may I notice from receiving this medicine? Side effects that you should report to your doctor or health care professional as soon as possible:  allergic reactions like skin rash, itching or hives, swelling of the face, lips, or tongue  signs of infection - fever or chills, cough, sore throat, pain or difficulty passing urine  signs of decreased red blood cells - unusually weak or tired, fainting spells,  lightheadedness  blood in urine  breathing problems  cough  eye pain, redness  flu-like symptoms  joint pain  bladder-area pain for more than 2 days after treatment  trouble passing urine or change in the amount of urine  vomiting  yellowing of the eyes or skin Side effects that usually do not require medical attention (report to your doctor or health care professional if they continue or are bothersome):  bladder spasm  burning when passing urine within 2 days of treatment  feel need to pass urine often or wake up at night to pass urine  loss of appetite This list may not describe all possible side effects. Call your doctor for medical advice about side effects. You may report side effects to FDA at 1-800-FDA-1088. Where should I keep my medicine? This drug is given in a hospital or clinic and will not be stored at home. NOTE: This sheet is a summary. It may not cover all possible information. If you have questions about this medicine, talk to your doctor, pharmacist, or health care provider.  2020 Elsevier/Gold Standard (2018-10-13 12:05:29)

## 2019-08-28 ENCOUNTER — Other Ambulatory Visit: Payer: Self-pay | Admitting: Urology

## 2019-08-28 DIAGNOSIS — D494 Neoplasm of unspecified behavior of bladder: Secondary | ICD-10-CM

## 2019-09-03 ENCOUNTER — Encounter: Payer: Self-pay | Admitting: Physician Assistant

## 2019-09-03 ENCOUNTER — Other Ambulatory Visit: Payer: Managed Care, Other (non HMO)

## 2019-09-03 ENCOUNTER — Other Ambulatory Visit: Payer: Self-pay

## 2019-09-03 ENCOUNTER — Ambulatory Visit (INDEPENDENT_AMBULATORY_CARE_PROVIDER_SITE_OTHER): Payer: Managed Care, Other (non HMO) | Admitting: Physician Assistant

## 2019-09-03 VITALS — BP 158/90 | HR 69 | Temp 97.3°F | Resp 16 | Wt 237.0 lb

## 2019-09-03 DIAGNOSIS — D494 Neoplasm of unspecified behavior of bladder: Secondary | ICD-10-CM

## 2019-09-03 DIAGNOSIS — I10 Essential (primary) hypertension: Secondary | ICD-10-CM

## 2019-09-03 LAB — MICROSCOPIC EXAMINATION: WBC, UA: >30 /hpf — AB (ref 0–5)

## 2019-09-03 LAB — URINALYSIS, COMPLETE
Bilirubin, UA: NEGATIVE
Glucose, UA: NEGATIVE
Ketones, UA: NEGATIVE
Nitrite, UA: NEGATIVE
Protein,UA: NEGATIVE
Specific Gravity, UA: 1.02 (ref 1.005–1.030)
Urobilinogen, Ur: 0.2 mg/dL (ref 0.2–1.0)
pH, UA: 5.5 (ref 5.0–7.5)

## 2019-09-03 MED ORDER — AMLODIPINE BESYLATE 5 MG PO TABS: 5.0000 mg | ORAL_TABLET | Freq: Every day | ORAL | 3 refills | Status: DC

## 2019-09-03 NOTE — Progress Notes (Signed)
Patient: Christina Holloway Female    DOB: 08/02/1961   59 y.o.   MRN: [id number] Visit Date: 09/03/2019  Today's Provider: Mar Daring, PA-C   Chief Complaint  Patient presents with  . Follow-up    BP   Subjective:     HPI  Patient here for blood pressure.  Hypertension, follow-up:  BP Readings from Last 3 Encounters:  09/03/19 (!) 158/90  08/22/19 (!) 200/116  08/10/19 (!) 156/74   She reports excellent compliance with treatment. She is not having side effects.  She is not adherent to low salt diet.   Outside blood pressures are high. She is experiencing some sob.  Patient denies chest pain, chest pressure/discomfort, exertional chest pressure/discomfort, fatigue, irregular heart beat, lower extremity edema, near-syncope and palpitations.   Cardiovascular risk factors include hypertension.     Weight trend: stable Wt Readings from Last 3 Encounters:  09/03/19 237 lb (107.5 kg)  08/22/19 235 lb (106.6 kg)  08/10/19 226 lb (102.5 kg)    Current diet: in general, an "unhealthy" diet  ------------------------------------------------------------------------   Allergies  Allergen Reactions  . Penicillins Rash    Did it involve swelling of the face/tongue/throat, SOB, or low BP? No Did it involve sudden or severe rash/hives, skin peeling, or any reaction on the inside of your mouth or nose? No Did you need to seek medical attention at a hospital or doctor's office? No When did it last happen?childhood allergy If all above answers are "NO", may proceed with cephalosporin use.      Current Outpatient Medications:  .  atenolol (TENORMIN) 25 MG tablet, Take 1 tablet (25 mg total) by mouth daily. (Patient taking differently: Take 25 mg by mouth every morning. ), Disp: 30 tablet, Rfl: 5 .  colchicine 0.6 MG tablet, Take 1.2mg  PO x1 at first sign of gout flare, then 0.6mg  1 hour later. Continue 0.6mg  BID until 48 hours after resolution of flare. (Patient  taking differently: Take 0.6 mg by mouth daily as needed (gout). ), Disp: 30 tablet, Rfl: 1 .  docusate sodium (COLACE) 100 MG capsule, Take 100 mg by mouth daily., Disp: , Rfl:  .  losartan-hydrochlorothiazide (HYZAAR) 100-12.5 MG tablet, Take 1 tablet by mouth daily., Disp: 30 tablet, Rfl: 5  Review of Systems  Constitutional: Negative for appetite change, chills, fatigue and fever.  Eyes: Negative for visual disturbance.  Respiratory: Positive for shortness of breath (sometimes). Negative for cough, chest tightness and wheezing.   Cardiovascular: Negative for chest pain, palpitations and leg swelling.  Gastrointestinal: Negative for abdominal pain, nausea and vomiting.  Neurological: Negative for dizziness, weakness and headaches.    Social History   Tobacco Use  . Smoking status: Never Smoker  . Smokeless tobacco: Never Used  Substance Use Topics  . Alcohol use: Yes    Alcohol/week: 36.0 standard drinks    Types: 36 Cans of beer per week      Objective:   BP (!) 158/90 (BP Location: Left Arm, Patient Position: Sitting, Cuff Size: Large)   Pulse 69   Temp (!) 97.3 F (36.3 C) (Temporal)   Resp 16   Wt 237 lb (107.5 kg)   BMI 37.12 kg/m  Vitals:   09/03/19 1626  BP: (!) 158/90  Pulse: 69  Resp: 16  Temp: (!) 97.3 F (36.3 C)  TempSrc: Temporal  Weight: 237 lb (107.5 kg)  Body mass index is 37.12 kg/m.   Physical Exam Vitals reviewed.  Constitutional:  General: She is not in acute distress.    Appearance: Normal appearance. She is well-developed. She is obese. She is not ill-appearing or diaphoretic.  Cardiovascular:     Rate and Rhythm: Normal rate and regular rhythm.     Pulses: Normal pulses.     Heart sounds: Normal heart sounds. No murmur. No friction rub. No gallop.   Pulmonary:     Effort: Pulmonary effort is normal. No respiratory distress.     Breath sounds: Normal breath sounds. No wheezing or rales.  Musculoskeletal:     Cervical back:  Normal range of motion and neck supple.  Neurological:     Mental Status: She is alert.      No results found for any visits on 09/03/19.     Assessment & Plan    1. Essential hypertension Elevated. Continue Losartan-HCTZ 100-12.5mg  and atenolol 25mg  daily. Add amlodipine 5mg  as below. Labs done last month reviewed in chart. Return in 6 weeks for BP recheck. If tolerating amlodipine, discussed changing losartan to benazepril, olmesartan or valsartan to use in a triple combination pill with amlodipine-ACE or ARB-HCTZ combination. She agrees.  - amLODipine (NORVASC) 5 MG tablet; Take 1 tablet (5 mg total) by mouth daily.  Dispense: 90 tablet; Refill: [place], PA-C  [facility]

## 2019-09-03 NOTE — Patient Instructions (Addendum)
DASH Eating Plan DASH stands for "Dietary Approaches to Stop Hypertension." The DASH eating plan is a healthy eating plan that has been shown to reduce high blood pressure (hypertension). It may also reduce your risk for type 2 diabetes, heart disease, and stroke. The DASH eating plan may also help with weight loss. What are tips for following this plan?  General guidelines  Avoid eating more than 2,300 mg (milligrams) of salt (sodium) a day. If you have hypertension, you may need to reduce your sodium intake to 1,500 mg a day.  Limit alcohol intake to no more than 1 drink a day for nonpregnant women and 2 drinks a day for men. One drink equals 12 oz of beer, 5 oz of wine, or 1 oz of hard liquor.  Work with your health care provider to maintain a healthy body weight or to lose weight. Ask what an ideal weight is for you.  Get at least 30 minutes of exercise that causes your heart to beat faster (aerobic exercise) most days of the week. Activities may include walking, swimming, or biking.  Work with your health care provider or diet and nutrition specialist (dietitian) to adjust your eating plan to your individual calorie needs. Reading food labels   Check food labels for the amount of sodium per serving. Choose foods with less than 5 percent of the Daily Value of sodium. Generally, foods with less than 300 mg of sodium per serving fit into this eating plan.  To find whole grains, look for the word "whole" as the first word in the ingredient list. Shopping  Buy products labeled as "low-sodium" or "no salt added."  Buy fresh foods. Avoid canned foods and premade or frozen meals. Cooking  Avoid adding salt when cooking. Use salt-free seasonings or herbs instead of table salt or sea salt. Check with your health care provider or pharmacist before using salt substitutes.  Do not fry foods. Cook foods using healthy methods such as baking, boiling, grilling, and broiling instead.  Cook with  heart-healthy oils, such as olive, canola, soybean, or sunflower oil. Meal planning  Eat a balanced diet that includes: ? 5 or more servings of fruits and vegetables each day. At each meal, try to fill half of your plate with fruits and vegetables. ? Up to 6-8 servings of whole grains each day. ? Less than 6 oz of lean meat, poultry, or fish each day. A 3-oz serving of meat is about the same size as a deck of cards. One egg equals 1 oz. ? 2 servings of low-fat dairy each day. ? A serving of nuts, seeds, or beans 5 times each week. ? Heart-healthy fats. Healthy fats called Omega-3 fatty acids are found in foods such as flaxseeds and coldwater fish, like sardines, salmon, and mackerel.  Limit how much you eat of the following: ? Canned or prepackaged foods. ? Food that is high in trans fat, such as fried foods. ? Food that is high in saturated fat, such as fatty meat. ? Sweets, desserts, sugary drinks, and other foods with added sugar. ? Full-fat dairy products.  Do not salt foods before eating.  Try to eat at least 2 vegetarian meals each week.  Eat more home-cooked food and less restaurant, buffet, and fast food.  When eating at a restaurant, ask that your food be prepared with less salt or no salt, if possible. What foods are recommended? The items listed may not be a complete list. Talk with your dietitian about   what dietary choices are best for you. Grains Whole-grain or whole-wheat bread. Whole-grain or whole-wheat pasta. Brown rice. Oatmeal. Quinoa. Bulgur. Whole-grain and low-sodium cereals. Pita bread. Low-fat, low-sodium crackers. Whole-wheat flour tortillas. Vegetables Fresh or frozen vegetables (raw, steamed, roasted, or grilled). Low-sodium or reduced-sodium tomato and vegetable juice. Low-sodium or reduced-sodium tomato sauce and tomato paste. Low-sodium or reduced-sodium canned vegetables. Fruits All fresh, dried, or frozen fruit. Canned fruit in natural juice (without  added sugar). Meat and other protein foods Skinless chicken or turkey. Ground chicken or turkey. Pork with fat trimmed off. Fish and seafood. Egg whites. Dried beans, peas, or lentils. Unsalted nuts, nut butters, and seeds. Unsalted canned beans. Lean cuts of beef with fat trimmed off. Low-sodium, lean deli meat. Dairy Low-fat (1%) or fat-free (skim) milk. Fat-free, low-fat, or reduced-fat cheeses. Nonfat, low-sodium ricotta or cottage cheese. Low-fat or nonfat yogurt. Low-fat, low-sodium cheese. Fats and oils Soft margarine without trans fats. Vegetable oil. Low-fat, reduced-fat, or light mayonnaise and salad dressings (reduced-sodium). Canola, safflower, olive, soybean, and sunflower oils. Avocado. Seasoning and other foods Herbs. Spices. Seasoning mixes without salt. Unsalted popcorn and pretzels. Fat-free sweets. What foods are not recommended? The items listed may not be a complete list. Talk with your dietitian about what dietary choices are best for you. Grains Baked goods made with fat, such as croissants, muffins, or some breads. Dry pasta or rice meal packs. Vegetables Creamed or fried vegetables. Vegetables in a cheese sauce. Regular canned vegetables (not low-sodium or reduced-sodium). Regular canned tomato sauce and paste (not low-sodium or reduced-sodium). Regular tomato and vegetable juice (not low-sodium or reduced-sodium). Pickles. Olives. Fruits Canned fruit in a light or heavy syrup. Fried fruit. Fruit in cream or butter sauce. Meat and other protein foods Fatty cuts of meat. Ribs. Fried meat. Bacon. Sausage. Bologna and other processed lunch meats. Salami. Fatback. Hotdogs. Bratwurst. Salted nuts and seeds. Canned beans with added salt. Canned or smoked fish. Whole eggs or egg yolks. Chicken or turkey with skin. Dairy Whole or 2% milk, cream, and half-and-half. Whole or full-fat cream cheese. Whole-fat or sweetened yogurt. Full-fat cheese. Nondairy creamers. Whipped toppings.  Processed cheese and cheese spreads. Fats and oils Butter. Stick margarine. Lard. Shortening. Ghee. Bacon fat. Tropical oils, such as coconut, palm kernel, or palm oil. Seasoning and other foods Salted popcorn and pretzels. Onion salt, garlic salt, seasoned salt, table salt, and sea salt. Worcestershire sauce. Tartar sauce. Barbecue sauce. Teriyaki sauce. Soy sauce, including reduced-sodium. Steak sauce. Canned and packaged gravies. Fish sauce. Oyster sauce. Cocktail sauce. Horseradish that you find on the shelf. Ketchup. Mustard. Meat flavorings and tenderizers. Bouillon cubes. Hot sauce and Tabasco sauce. Premade or packaged marinades. Premade or packaged taco seasonings. Relishes. Regular salad dressings. Where to find more information:  National Heart, Lung, and Blood Institute: www.nhlbi.nih.gov  American Heart Association: www.heart.org Summary  The DASH eating plan is a healthy eating plan that has been shown to reduce high blood pressure (hypertension). It may also reduce your risk for type 2 diabetes, heart disease, and stroke.  With the DASH eating plan, you should limit salt (sodium) intake to 2,300 mg a day. If you have hypertension, you may need to reduce your sodium intake to 1,500 mg a day.  When on the DASH eating plan, aim to eat more fresh fruits and vegetables, whole grains, lean proteins, low-fat dairy, and heart-healthy fats.  Work with your health care provider or diet and nutrition specialist (dietitian) to adjust your eating plan to your   individual calorie needs. This information is not intended to replace advice given to you by your health care provider. Make sure you discuss any questions you have with your health care provider. Document Revised: 07/22/2017 Document Reviewed: 08/02/2016 Elsevier Patient Education  [place]. Amlodipine Oral Tablets What is this medicine? AMLODIPINE (am LOE di peen) is a calcium channel blocker. It relaxes your blood vessels  and decreases the amount of work the heart has to do. It treats high blood pressure and/or prevents chest pain (also called angina). This medicine may be used for other purposes; ask your health care provider or pharmacist if you have questions. COMMON BRAND NAME(S): Norvasc What should I tell my health care provider before I take this medicine? They need to know if you have any of these conditions:  heart disease  liver disease  an unusual or allergic reaction to amlodipine, other drugs, foods, dyes, or preservatives  pregnant or trying to get pregnant  breast-feeding How should I use this medicine? Take this drug by mouth. Take it as directed on the prescription label at the same time every day. You can take it with or without food. If it upsets your stomach, take it with food. Keep taking it unless your health care provider tells you to stop. Talk to your health care provider about the use of this drug in children. While it may be prescribed for children as young as 6 for selected conditions, precautions do apply. Overdosage: If you think you have taken too much of this medicine contact a poison control center or emergency room at once. NOTE: This medicine is only for you. Do not share this medicine with others. What if I miss a dose? If you miss a dose, take it as soon as you can. If it is almost time for your next dose, take only that dose. Do not take double or extra doses. What may interact with this medicine? This medicine may interact with the following medications:  clarithromycin  cyclosporine  diltiazem  itraconazole  simvastatin  tacrolimus This list may not describe all possible interactions. Give your health care provider a list of all the medicines, herbs, non-prescription drugs, or dietary supplements you use. Also tell them if you smoke, drink alcohol, or use illegal drugs. Some items may interact with your medicine. What should I watch for while using this  medicine? Visit your health care provider for regular checks on your progress. Check your blood pressure as directed. Ask your health care provider what your blood pressure should be. Also, find out when you should contact him or her. Do not treat yourself for coughs, colds, or pain while you are using this drug without asking your health care provider for advice. Some drugs may increase your blood pressure. You may get drowsy or dizzy. Do not drive, use machinery, or do anything that needs mental alertness until you know how this drug affects you. Do not stand up or sit up quickly, especially if you are an older patient. This reduces the risk of dizzy or fainting spells. What side effects may I notice from receiving this medicine? Side effects that you should report to your doctor or health care provider as soon as possible:  allergic reactions (skin rash, itching or hives; swelling of the face, lips, or tongue)  heart attack (trouble breathing; pain or tightness in the chest, neck, back or arms; unusually weak or tired)  low blood pressure (dizziness; feeling faint or lightheaded, falls; unusually weak or  tired) Side effects that usually do not require medical attention (report these to your doctor or health care provider if they continue or are bothersome):  facial flushing  nausea  palpitations  stomach pain  sudden weight gain  swelling of the ankles, feet, hands This list may not describe all possible side effects. Call your doctor for medical advice about side effects. You may report side effects to FDA at 1-800-FDA-1088. Where should I keep my medicine? Keep out of the reach of children and pets. Store at room temperature between 59 and 86 degrees F (15 and 30 degrees C). Protect from light and moisture. Keep the container tightly closed. Throw away any unused drug after the expiration date. NOTE: This sheet is a summary. It may not cover all possible information. If you have  questions about this medicine, talk to your doctor, pharmacist, or health care provider.  2020 Elsevier/Gold Standard (2019-05-15 19:39:45)

## 2019-09-05 ENCOUNTER — Ambulatory Visit: Payer: Managed Care, Other (non HMO) | Admitting: Urology

## 2019-09-05 LAB — CULTURE, URINE COMPREHENSIVE

## 2019-09-06 ENCOUNTER — Encounter
Admission: RE | Admit: 2019-09-06 | Discharge: 2019-09-06 | Disposition: A | Discharge status: Home / Self Care | Payer: Managed Care, Other (non HMO) | Source: Ambulatory Visit | Attending: Urology | Admitting: Urology

## 2019-09-06 ENCOUNTER — Other Ambulatory Visit: Payer: Self-pay

## 2019-09-06 DIAGNOSIS — Z01812 Encounter for preprocedural laboratory examination: Secondary | ICD-10-CM | POA: Insufficient documentation

## 2019-09-06 NOTE — Patient Instructions (Addendum)
Your procedure is scheduled on:  09-14-19 FRIDAY Report to Same Day Surgery 2nd floor medical mall [facility] Entrance-take elevator on left to 2nd floor.  Check in with surgery information desk.) To find out your arrival time please call [phone] between 1PM - 3PM on 09-13-19 THURSDAY  Remember: Instructions that are not followed completely may result in serious medical risk, up to and including death, or upon the discretion of your surgeon and anesthesiologist your surgery may need to be rescheduled.    _x___ 1. Do not eat food after midnight the night before your procedure. NO GUM OR CANDY AFTER MIDNIGHT. You may drink clear liquids up to 2 hours before you are scheduled to arrive at the hospital for your procedure.  Do not drink clear liquids within 2 hours of your scheduled arrival to the hospital.  Clear liquids include  --Water or Apple juice without pulp  --Gatorade  --Black Coffee or Clear Tea (No milk, no creamers, do not add anything to the coffee or Tea   ____Ensure clear carbohydrate drink on the way to the hospital for bariatric patients  ____Ensure clear carbohydrate drink 3 hours before surgery.    __x__ 2. No Alcohol for 24 hours before or after surgery.   __x__3. No Smoking or e-cigarettes for 24 prior to surgery.  Do not use any chewable tobacco products for at least 6 hour prior to surgery   ____  4. Bring all medications with you on the day of surgery if instructed.    __x__ 5. Notify your doctor if there is any change in your medical condition     (cold, fever, infections).    x___6. On the morning of surgery brush your teeth with toothpaste and water.  You may rinse your mouth with mouth wash if you wish.  Do not swallow any toothpaste or mouthwash.   Do not wear jewelry, make-up, hairpins, clips or nail polish.  Do not wear lotions, powders, or perfumes. You may wear deodorant.  Do not shave 48 hours prior to surgery. Men may shave face and neck.  Do  not bring valuables to the hospital.    [facility] is not responsible for any belongings or valuables.               Contacts, dentures or bridgework may not be worn into surgery.  Leave your suitcase in the car. After surgery it may be brought to your room.  For patients admitted to the hospital, discharge time is determined by your treatment team.  _  Patients discharged the day of surgery will not be allowed to drive home.  You will need someone to drive you home and stay with you the night of your procedure.    Please read over the following fact sheets that you were given:   [facility] Preparing for Surgery and or MRSA Information   _x___ TAKE THE FOLLOWING MEDICATION THE MORNING OF SURGERY WITH A SMALL SIP OF WATER. These include:  1. NORVASC (AMLODIPINE)  2. ATENOLOL  3.  4.  5.  6.  ____Fleets enema or Magnesium Citrate as directed.   ____ Use CHG Soap or sage wipes as directed on instruction sheet   ____ Use inhalers on the day of surgery and bring to hospital day of surgery  ____ Stop Metformin and Janumet 2 days prior to surgery.     ____ Take 1/2 of usual insulin dose the night before surgery and none on the morning surgery.  ____ Follow recommendations from Cardiologist, Pulmonologist or PCP regarding stopping Aspirin, Coumadin, Plavix ,Eliquis, Effient, or Pradaxa, and Pletal.  X____Stop Anti-inflammatories such as Advil, Aleve, Ibuprofen, Motrin, Naproxen, Naprosyn, Goodies powders or aspirin products NOW-OK to take Tylenol    ____ Stop supplements until after surgery.     ____ Bring C-Pap to the hospital.

## 2019-09-07 ENCOUNTER — Ambulatory Visit: Payer: Managed Care, Other (non HMO) | Admitting: Family

## 2019-09-07 VITALS — BP 140/82 | HR 72 | Temp 98.0°F | Resp 18 | Wt 243.0 lb

## 2019-09-07 DIAGNOSIS — A4901 Methicillin susceptible Staphylococcus aureus infection, unspecified site: Secondary | ICD-10-CM | POA: Diagnosis not present

## 2019-09-07 DIAGNOSIS — C674 Malignant neoplasm of posterior wall of bladder: Secondary | ICD-10-CM | POA: Diagnosis not present

## 2019-09-07 MED ORDER — SULFAMETHOXAZOLE-TRIMETHOPRIM 800-160 MG PO TABS: 1.0000 | ORAL_TABLET | Freq: Two times a day (BID) | ORAL | 0 refills | Status: DC

## 2019-09-07 NOTE — Progress Notes (Signed)
Subjective:     Christina Holloway is a 59 y.o. female who complains of a rash. Symptoms began 1 day ago. Patient describes the rash as erythematous, localized, lesions. Characteristics of rash and associated history: Similar rash in the past? no. Patient's previous dermatologic history includes none. Family history of derm problems: herpes simplex virus. Medications currently using: none. Environmental exposures or allergies: none     Review of Systems Pertinent items are noted in HPI.    Objective:    BP 140/82 (BP Location: Left Arm, Patient Position: Sitting, Cuff Size: Large)   Pulse 72   Temp 98 F (36.7 C)   Resp 18   Wt 243 lb (110.2 kg)   SpO2 98%   BMI 38.06 kg/m  Physical Exam  General:  alert, cooperative and no distress  HEENT:  PERRLA  Lymph Nodes:  Cervical adenopathy: right side node palpable  Lungs:  clear to auscultation bilaterally  Heart:  regular rate and rhythm, S1, S2 normal, no murmur, click, rub or gallop  Extremities:  extremities normal, atraumatic, no cyanosis or edema  Skin:   Rash located on the face.   Shape:   raised, round   Consistency:   tender  Color:   salmon colored  Type: nodule(s) - face, forehead about brow line  Size: 1cm    The remainder of the skin exam:  color normal  Laboratory:       Assessment:    MRSA    Plan:    1.  topical antibiotic 2.  Verbal patient instruction given. 3. Follow up as needed for acute illness

## 2019-09-10 ENCOUNTER — Ambulatory Visit: Payer: Self-pay

## 2019-09-12 ENCOUNTER — Other Ambulatory Visit
Admission: RE | Admit: 2019-09-12 | Discharge: 2019-09-12 | Disposition: A | Discharge status: Home / Self Care | Payer: Managed Care, Other (non HMO) | Source: Ambulatory Visit | Attending: Urology | Admitting: Urology

## 2019-09-12 ENCOUNTER — Other Ambulatory Visit: Payer: Self-pay

## 2019-09-12 DIAGNOSIS — Z20822 Contact with and (suspected) exposure to covid-19: Secondary | ICD-10-CM | POA: Diagnosis not present

## 2019-09-12 DIAGNOSIS — Z01812 Encounter for preprocedural laboratory examination: Secondary | ICD-10-CM | POA: Insufficient documentation

## 2019-09-12 LAB — SARS CORONAVIRUS 2 (TAT 6-24 HRS): SARS Coronavirus 2: NEGATIVE

## 2019-09-12 LAB — POTASSIUM: Potassium: 3.9 mmol/L (ref 3.5–5.1)

## 2019-09-13 MED ORDER — CIPROFLOXACIN IN D5W 400 MG/200ML IV SOLN
400.0000 mg | INTRAVENOUS | Status: AC
  Administered 2019-09-14: 400 mg via INTRAVENOUS

## 2019-09-14 ENCOUNTER — Ambulatory Visit: Payer: Managed Care, Other (non HMO)

## 2019-09-14 ENCOUNTER — Encounter
Admission: RE | Disposition: A | Discharge status: Home / Self Care | Payer: Self-pay | Source: Home / Self Care | Attending: Urology

## 2019-09-14 ENCOUNTER — Other Ambulatory Visit: Payer: Self-pay

## 2019-09-14 ENCOUNTER — Encounter: Payer: Self-pay | Admitting: Urology

## 2019-09-14 ENCOUNTER — Ambulatory Visit
Admission: RE | Admit: 2019-09-14 | Discharge: 2019-09-14 | Disposition: A | Discharge status: Home / Self Care | Payer: Managed Care, Other (non HMO) | Attending: Urology | Admitting: Urology

## 2019-09-14 DIAGNOSIS — M1990 Unspecified osteoarthritis, unspecified site: Secondary | ICD-10-CM | POA: Insufficient documentation

## 2019-09-14 DIAGNOSIS — M109 Gout, unspecified: Secondary | ICD-10-CM | POA: Diagnosis not present

## 2019-09-14 DIAGNOSIS — I10 Essential (primary) hypertension: Secondary | ICD-10-CM | POA: Insufficient documentation

## 2019-09-14 DIAGNOSIS — E669 Obesity, unspecified: Secondary | ICD-10-CM | POA: Diagnosis not present

## 2019-09-14 DIAGNOSIS — D494 Neoplasm of unspecified behavior of bladder: Secondary | ICD-10-CM

## 2019-09-14 DIAGNOSIS — Z6836 Body mass index (BMI) 36.0-36.9, adult: Secondary | ICD-10-CM | POA: Insufficient documentation

## 2019-09-14 DIAGNOSIS — N3289 Other specified disorders of bladder: Secondary | ICD-10-CM | POA: Diagnosis not present

## 2019-09-14 DIAGNOSIS — Z79899 Other long term (current) drug therapy: Secondary | ICD-10-CM | POA: Insufficient documentation

## 2019-09-14 DIAGNOSIS — C679 Malignant neoplasm of bladder, unspecified: Secondary | ICD-10-CM | POA: Diagnosis not present

## 2019-09-14 DIAGNOSIS — E7800 Pure hypercholesterolemia, unspecified: Secondary | ICD-10-CM | POA: Insufficient documentation

## 2019-09-14 DIAGNOSIS — Z6835 Body mass index (BMI) 35.0-35.9, adult: Secondary | ICD-10-CM | POA: Diagnosis not present

## 2019-09-14 HISTORY — PX: CYSTOSCOPY WITH FULGERATION: SHX6638

## 2019-09-14 SURGERY — CYSTOSCOPY, WITH BLADDER FULGURATION
Anesthesia: General

## 2019-09-14 MED ORDER — LIDOCAINE HCL (CARDIAC) PF 100 MG/5ML IV SOSY
PREFILLED_SYRINGE | INTRAVENOUS | Status: DC | PRN
  Administered 2019-09-14: 100 mg via INTRAVENOUS

## 2019-09-14 MED ORDER — FAMOTIDINE 20 MG PO TABS
ORAL_TABLET | ORAL | Status: AC
  Administered 2019-09-14: 11:00:00 20 mg via ORAL
  Filled 2019-09-14: qty 1

## 2019-09-14 MED ORDER — FENTANYL CITRATE (PF) 100 MCG/2ML IJ SOLN
INTRAMUSCULAR | Status: DC | PRN
  Administered 2019-09-14: 50 ug via INTRAVENOUS

## 2019-09-14 MED ORDER — PROPOFOL 10 MG/ML IV BOLUS
INTRAVENOUS | Status: DC | PRN
  Administered 2019-09-14: 200 mg via INTRAVENOUS

## 2019-09-14 MED ORDER — OXYCODONE HCL 5 MG/5ML PO SOLN: 5.0000 mg | Freq: Once | ORAL | Status: DC | PRN

## 2019-09-14 MED ORDER — LACTATED RINGERS IV SOLN: INTRAVENOUS | Status: DC

## 2019-09-14 MED ORDER — DEXAMETHASONE SODIUM PHOSPHATE 10 MG/ML IJ SOLN
INTRAMUSCULAR | Status: AC
  Filled 2019-09-14: qty 1

## 2019-09-14 MED ORDER — EPHEDRINE SULFATE 50 MG/ML IJ SOLN
INTRAMUSCULAR | Status: AC
  Filled 2019-09-14: qty 1

## 2019-09-14 MED ORDER — MIDAZOLAM HCL 2 MG/2ML IJ SOLN
INTRAMUSCULAR | Status: AC
  Filled 2019-09-14: qty 2

## 2019-09-14 MED ORDER — GLYCOPYRROLATE 0.2 MG/ML IJ SOLN
INTRAMUSCULAR | Status: AC
  Filled 2019-09-14: qty 1

## 2019-09-14 MED ORDER — GLYCOPYRROLATE 0.2 MG/ML IJ SOLN
INTRAMUSCULAR | Status: DC | PRN
  Administered 2019-09-14: .2 mg via INTRAVENOUS

## 2019-09-14 MED ORDER — FENTANYL CITRATE (PF) 100 MCG/2ML IJ SOLN
INTRAMUSCULAR | Status: AC
  Filled 2019-09-14: qty 2

## 2019-09-14 MED ORDER — SUGAMMADEX SODIUM 500 MG/5ML IV SOLN
INTRAVENOUS | Status: DC | PRN
  Administered 2019-09-14: 500 mg via INTRAVENOUS

## 2019-09-14 MED ORDER — ONDANSETRON HCL 4 MG/2ML IJ SOLN
INTRAMUSCULAR | Status: AC
  Filled 2019-09-14: qty 4

## 2019-09-14 MED ORDER — FAMOTIDINE 20 MG PO TABS: 20.0000 mg | ORAL_TABLET | Freq: Once | ORAL | Status: AC

## 2019-09-14 MED ORDER — CIPROFLOXACIN IN D5W 400 MG/200ML IV SOLN
INTRAVENOUS | Status: AC
  Filled 2019-09-14: qty 200

## 2019-09-14 MED ORDER — OXYCODONE HCL 5 MG PO TABS: 5.0000 mg | ORAL_TABLET | Freq: Once | ORAL | Status: DC | PRN

## 2019-09-14 MED ORDER — SUCCINYLCHOLINE CHLORIDE 20 MG/ML IJ SOLN
INTRAMUSCULAR | Status: DC | PRN
  Administered 2019-09-14: 200 mg via INTRAVENOUS

## 2019-09-14 MED ORDER — FENTANYL CITRATE (PF) 100 MCG/2ML IJ SOLN: 25.0000 ug | INTRAMUSCULAR | Status: DC | PRN

## 2019-09-14 MED ORDER — PHENYLEPHRINE HCL (PRESSORS) 10 MG/ML IV SOLN
INTRAVENOUS | Status: AC
  Filled 2019-09-14: qty 1

## 2019-09-14 MED ORDER — PHENYLEPHRINE HCL (PRESSORS) 10 MG/ML IV SOLN
INTRAVENOUS | Status: DC | PRN
  Administered 2019-09-14 (×2): 200 ug via INTRAVENOUS
  Administered 2019-09-14: 100 ug via INTRAVENOUS

## 2019-09-14 MED ORDER — DEXAMETHASONE SODIUM PHOSPHATE 10 MG/ML IJ SOLN
INTRAMUSCULAR | Status: DC | PRN
  Administered 2019-09-14 (×2): 10 mg via INTRAVENOUS

## 2019-09-14 MED ORDER — MIDAZOLAM HCL 2 MG/2ML IJ SOLN
INTRAMUSCULAR | Status: DC | PRN
  Administered 2019-09-14 (×2): 1 mg via INTRAVENOUS

## 2019-09-14 MED ORDER — ONDANSETRON HCL 4 MG/2ML IJ SOLN
INTRAMUSCULAR | Status: DC | PRN
  Administered 2019-09-14: 4 mg via INTRAVENOUS

## 2019-09-14 MED ORDER — ROCURONIUM BROMIDE 100 MG/10ML IV SOLN
INTRAVENOUS | Status: DC | PRN
  Administered 2019-09-14: 30 mg via INTRAVENOUS

## 2019-09-14 MED ORDER — SUGAMMADEX SODIUM 500 MG/5ML IV SOLN
INTRAVENOUS | Status: AC
  Filled 2019-09-14: qty 5

## 2019-09-14 MED ORDER — EPHEDRINE SULFATE 50 MG/ML IJ SOLN
INTRAMUSCULAR | Status: DC | PRN
  Administered 2019-09-14 (×2): 10 mg via INTRAVENOUS

## 2019-09-14 SURGICAL SUPPLY — 27 items
BAG DRAIN CYSTO-URO LG1000N (MISCELLANEOUS) ×4 IMPLANT
BAG URINE DRAIN 2000ML AR STRL (UROLOGICAL SUPPLIES) ×4 IMPLANT
BRUSH SCRUB EZ  4% CHG (MISCELLANEOUS) ×2
BRUSH SCRUB EZ 4% CHG (MISCELLANEOUS) ×2 IMPLANT
CATH FOL 2WAY LX 18X30 (CATHETERS) ×4 IMPLANT
CRADLE LAMINECT ARM (MISCELLANEOUS) ×4 IMPLANT
DRAPE UTILITY 15X26 TOWEL STRL (DRAPES) ×4 IMPLANT
DRSG TELFA 4X3 1S NADH ST (GAUZE/BANDAGES/DRESSINGS) ×4 IMPLANT
ELECT LOOP 22F BIPOLAR SML (ELECTROSURGICAL)
ELECT REM PT RETURN 9FT ADLT (ELECTROSURGICAL)
ELECTRODE LOOP 22F BIPOLAR SML (ELECTROSURGICAL) IMPLANT
ELECTRODE REM PT RTRN 9FT ADLT (ELECTROSURGICAL) IMPLANT
GLOVE BIOGEL PI IND STRL 7.5 (GLOVE) ×2 IMPLANT
GLOVE BIOGEL PI INDICATOR 7.5 (GLOVE) ×2
GOWN STRL REUS W/ TWL LRG LVL3 (GOWN DISPOSABLE) ×2 IMPLANT
GOWN STRL REUS W/ TWL XL LVL3 (GOWN DISPOSABLE) ×2 IMPLANT
GOWN STRL REUS W/TWL LRG LVL3 (GOWN DISPOSABLE) ×2
GOWN STRL REUS W/TWL XL LVL3 (GOWN DISPOSABLE) ×2
KIT TURNOVER CYSTO (KITS) ×4 IMPLANT
LOOP CUT BIPOLAR 24F LRG (ELECTROSURGICAL) IMPLANT
NDL SAFETY ECLIPSE 18X1.5 (NEEDLE) ×2 IMPLANT
NEEDLE HYPO 18GX1.5 SHARP (NEEDLE) ×2
PACK CYSTO AR (MISCELLANEOUS) ×4 IMPLANT
SET IRRIG Y TYPE TUR BLADDER L (SET/KITS/TRAYS/PACK) ×4 IMPLANT
SURGILUBE 2OZ TUBE FLIPTOP (MISCELLANEOUS) ×4 IMPLANT
SYRINGE IRR TOOMEY STRL 70CC (SYRINGE) ×4 IMPLANT
WATER STERILE IRR 1000ML POUR (IV SOLUTION) ×4 IMPLANT

## 2019-09-14 NOTE — Discharge Instructions (Signed)

## 2019-09-14 NOTE — Op Note (Signed)
Date of procedure: 09/14/19  Preoperative diagnosis:  1. Bladder tumor  Postoperative diagnosis:  1. Same  Procedure: 1. Cystoscopy, bladder biopsy and fulguration  Surgeon: Nickolas Madrid, MD  Anesthesia: General  Complications: None  Intraoperative findings:  1.  2 cm area of fibrinous tissue and scar from prior TURBT, no evidence of visually recurrent disease on cystoscopy  EBL: Minimal  Specimens: Bladder biopsy  Drains: None  Indication: Christina Holloway is a 59 y.o. 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  After reviewing the management options for treatment, they elected to proceed with the above surgical procedure(s). We have discussed the potential benefits and risks of the procedure, side effects of the proposed treatment, the likelihood of the patient achieving the goals of the procedure, and any potential problems that might occur during the procedure or recuperation. Informed consent has been obtained.  Description of procedure:  The patient was taken to the operating room and general anesthesia was induced. SCDs were placed for DVT prophylaxis.The patient was placed in the dorsal lithotomy position, prepped and draped in the usual sterile fashion, and preoperative antibiotics(cefazolin) were administered. A preoperative time-out was performed.   A 21 French rigid cystoscope was used to intubate the urethra and thorough cystoscopy was performed.  The ureteral orifices were orthotopic bilaterally.  There was a 2 cm area of fibrinous tissue and scar at the right posterior lateral bladder wall from her prior TURBT.  There was no evidence of recurrent papillary tumor around the prior resection, or anywhere else in the bladder.  The rigid cold cup biopsy forceps were used to remove all tissue at the prior resection site, and the Bugbee was used to obtain meticulous hemostasis.  With the bladder decompressed, there was was no  bleeding noted from the resection bed.  Disposition: Stable to PACU  Plan: Discuss pathology in 1 to 2 weeks If no evidence of muscle invasion, pursue induction BCG and maintenance  Nickolas Madrid, MD

## 2019-09-14 NOTE — Anesthesia Preprocedure Evaluation (Signed)
Anesthesia Evaluation  Patient identified by MRN, date of birth, ID band Patient awake    Reviewed: Allergy & Precautions, H&P , NPO status , Patient's Chart, lab work & pertinent test results  Airway Mallampati: II  TM Distance: >3 FB Neck ROM: full    Dental  (+) Edentulous Lower, Edentulous Upper   Pulmonary neg pulmonary ROS, neg COPD,           Cardiovascular hypertension, (-) angina(-) Past MI (-) dysrhythmias      Neuro/Psych negative neurological ROS  negative psych ROS   GI/Hepatic negative GI ROS, Neg liver ROS,   Endo/Other  negative endocrine ROS  Renal/GU      Musculoskeletal   Abdominal   Peds  Hematology  (+) Blood dyscrasia, anemia ,   Anesthesia Other Findings Obesity  Past Medical History: 05/02/2015: Abnormal LFTs 08/24/2003: Anemia, iron deficiency     Comment:  in past No date: Arthritis     Comment:  lower back, legs 05/02/2015: Calcium blood increased No date: Cancer ([place]) 05/02/2015: Decreased potassium in the blood 08/24/2003: Essential (primary) hypertension 05/02/2015: Gout 04/29/2009: Hypercholesterolemia without hypertriglyceridemia 05/02/2015: Malaise and fatigue No date: Shortness of breath dyspnea     Comment:  1 flight stairs No date: Wears dentures     Comment:  full upper, partial lower  Past Surgical History: 05/08/2015: COLONOSCOPY WITH PROPOFOL; N/A     Comment:  Procedure: COLONOSCOPY WITH PROPOFOL;  Surgeon: Lucilla Lame, MD;  Location: [place] [facility];  Service:               Endoscopy;  Laterality: N/A; 05/30/2018: COLONOSCOPY WITH PROPOFOL; N/A     Comment:  Procedure: COLONOSCOPY WITH PROPOFOL;  Surgeon: Lin Landsman, MD;  Location: [facility] ENDOSCOPY;  Service:               Gastroenterology;  Laterality: N/A; No date: ENDOMETRIAL ABLATION 05/08/2015: POLYPECTOMY     Comment:  Procedure: POLYPECTOMY;  Surgeon: Lucilla Lame, MD;      Location: [place];  Service: Endoscopy;; No date: VAGINAL HYSTERECTOMY  BMI    Body Mass Index: 35.40 kg/m      Reproductive/Obstetrics negative OB ROS                             Anesthesia Physical  Anesthesia Plan  ASA: II  Anesthesia Plan: General ETT   Post-op Pain Management:    Induction:   PONV Risk Score and Plan: Ondansetron, Dexamethasone, Midazolam and Treatment may vary due to age or medical condition  Airway Management Planned:   Additional Equipment:   Intra-op Plan:   Post-operative Plan:   Informed Consent: I have reviewed the patients History and Physical, chart, labs and discussed the procedure including the risks, benefits and alternatives for the proposed anesthesia with the patient or authorized representative who has indicated his/her understanding and acceptance.     Dental Advisory Given  Plan Discussed with: Anesthesiologist  Anesthesia Plan Comments:         Anesthesia Quick Evaluation

## 2019-09-14 NOTE — Anesthesia Postprocedure Evaluation (Signed)
Anesthesia Post Note  Patient: Christina Holloway  Procedure(s) Performed: CYSTOSCOPY WITH [place]  Patient location during evaluation: PACU Anesthesia Type: General Level of consciousness: awake and alert Pain management: pain level controlled Vital Signs Assessment: post-procedure vital signs reviewed and stable Respiratory status: spontaneous breathing, nonlabored ventilation, respiratory function stable and patient connected to nasal cannula oxygen Cardiovascular status: blood pressure returned to baseline and stable Postop Assessment: no apparent nausea or vomiting Anesthetic complications: no     Last Vitals:  Vitals:   09/14/19 1243 09/14/19 1252  BP: 109/80 108/73  Pulse: 77 77  Resp: (!) 25 18  Temp: (!) 36.1 C (!) 36 C  SpO2: 99% 95%    Last Pain:  Vitals:   09/14/19 1252  TempSrc:   PainSc: 0-No pain                 Precious Haws Piscitello

## 2019-09-14 NOTE — H&P (Signed)
09/14/19 11:14 AM   [name] 10/11/1960 [id number]   HPI: Ms. Seiders is a 59 year old female here today for second look TURBT.  She is a healthy woman who originally presented with gross hematuria and was found to have a 2 cm bladder tumor.  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  PMH: Past Medical History:  Diagnosis Date  . Abnormal LFTs 05/02/2015  . Anemia, iron deficiency 08/24/2003   in past  . Arthritis    lower back, legs  . Calcium blood increased 05/02/2015  . Cancer ([place])   . Decreased potassium in the blood 05/02/2015  . Essential (primary) hypertension 08/24/2003  . Gout 05/02/2015  . Hypercholesterolemia without hypertriglyceridemia 04/29/2009  . Malaise and fatigue 05/02/2015  . Shortness of breath dyspnea    1 flight stairs  . Wears dentures    full upper, partial lower    Surgical History: Past Surgical History:  Procedure Laterality Date  . COLONOSCOPY WITH PROPOFOL N/A 05/08/2015   Procedure: COLONOSCOPY WITH PROPOFOL;  Surgeon: Lucilla Lame, MD;  Location: [facility];  Service: Endoscopy;  Laterality: N/A;  . COLONOSCOPY WITH PROPOFOL N/A 05/30/2018   Procedure: COLONOSCOPY WITH PROPOFOL;  Surgeon: Lin Landsman, MD;  Location[facility] ENDOSCOPY;  Service: Gastroenterology;  Laterality: N/A;  . ENDOMETRIAL ABLATION    . POLYPECTOMY  05/08/2015   Procedure: POLYPECTOMY;  Surgeon: Lucilla Lame, MD;  Location: [facility];  Service: Endoscopy;;  . TRANSURETHRAL RESECTION OF BLADDER TUMOR WITH MITOMYCIN-C N/A 08/10/2019   Procedure: TRANSURETHRAL RESECTION OF BLADDER TUMOR WITH Gemcitabine;  Surgeon: Billey Co, MD;  Location: [facility];  Service: Urology;  Laterality: N/A;  . VAGINAL HYSTERECTOMY      Allergies:  Allergies  Allergen Reactions  . Penicillins Rash    Did it involve swelling of the face/tongue/throat, SOB,  or low BP? No Did it involve sudden or severe rash/hives, skin peeling, or any reaction on the inside of your mouth or nose? No Did you need to seek medical attention at a hospital or doctor's office? No When did it last happen?childhood allergy If all above answers are "NO", may proceed with cephalosporin use.     Family History: Family History  Problem Relation Age of Onset  . Hypertension Mother   . Thyroid disease Mother   . Prostate cancer Father   . AAA (abdominal aortic aneurysm) Sister   . Breast cancer Paternal Grandmother 4  . Breast cancer Cousin 65    Social History:  reports that she has never smoked. She has never used smokeless tobacco. She reports current alcohol use of about 36.0 standard drinks of alcohol per week. She reports that she does not use drugs.  ROS: Please see flowsheet from today's date for complete review of systems.  Physical Exam: BP (!) 141/78   Pulse (!) 57   Temp 98.3 F (36.8 C) (Oral)   Resp 16   Ht 5\' 8"  (1.727 m)   Wt 108 kg   SpO2 100%   BMI 36.19 kg/m    Constitutional:  Alert and oriented, No acute distress. Cardiovascular: Regular rate and rhythm Respiratory: Clear to auscultation bilaterally GI: Abdomen is soft, nontender, nondistended, no abdominal masses Lymph: No cervical or inguinal lymphadenopathy. Skin: No rashes, bruises or suspicious lesions. Neurologic: Grossly intact, no focal deficits, moving all 4 extremities. Psychiatric: Normal mood and affect.  Laboratory Data:  Urine culture 1/11 25-50k mixed flora  Assessment & Plan:   Ms. Waterford is a 59 year old female with high-grade T1 urothelial cell carcinoma, here today for second look TURBT.  We discussed transurethral resection of bladder tumor (TURBT) and risks and benefits at length. This is typically a 1 to 2-hour procedure done under general anesthesia in the operating room.  A scope is inserted through the urethra and used to resect abnormal tissue  within the bladder, which is then sent to the pathologist to determine grade and stage of the tumor.  Risks include bleeding, infection, need for temporary Foley placement, and bladder perforation.  Treatment strategies are based on the type of tumor and depth of invasion.  We again reviewed the nonmuscle invasive versus muscle invasive bladder cancer treatment strategies, and need for BCG and close surveillance if biopsy today shows no evidence of muscle invasive disease.  Second look TURBT today  Billey Co, MD  [facility] [address], [place], [place] [address] [phone]

## 2019-09-14 NOTE — Anesthesia Procedure Notes (Signed)
Procedure Name: Intubation Performed by: Fletcher-Harrison, Orian Amberg, CRNA Pre-anesthesia Checklist: Patient identified, Emergency Drugs available, Suction available and Patient being monitored Patient Re-evaluated:Patient Re-evaluated prior to induction Oxygen Delivery Method: Circle system utilized Preoxygenation: Pre-oxygenation with 100% oxygen Induction Type: IV induction Ventilation: Mask ventilation without difficulty Laryngoscope Size: McGraph and 3 Grade View: Grade I Tube type: Oral Tube size: 7.0 mm Number of attempts: 1 Airway Equipment and Method: Stylet Placement Confirmation: ETT inserted through vocal cords under direct vision,  positive ETCO2,  CO2 detector and breath sounds checked- equal and bilateral Secured at: 21 cm Tube secured with: Tape Dental Injury: Teeth and Oropharynx as per pre-operative assessment        

## 2019-09-14 NOTE — Transfer of Care (Signed)
Immediate Anesthesia Transfer of Care Note  Patient: Christina Holloway  Procedure(s) Performed: CYSTOSCOPY WITH FULGERATION  Patient Location: PACU  Anesthesia Type:General  Level of Consciousness: drowsy and patient cooperative  Airway & Oxygen Therapy: Patient Spontanous Breathing and Patient connected to face mask oxygen  Post-op Assessment: Report given to RN and Post -op Vital signs reviewed and stable  Post vital signs: Reviewed and stable  Last Vitals:  Vitals Value Taken Time  BP 125/86 09/14/19 1215  Temp 98.0 09/14/2019 1218  Pulse 86 09/14/19 1218  Resp 15 09/14/19 1218  SpO2 99 % 09/14/19 1218  Vitals shown include unvalidated device data.  Last Pain:  Vitals:   09/14/19 1010  TempSrc: Oral  PainSc: 0-No pain         Complications: No apparent anesthesia complications

## 2019-09-17 ENCOUNTER — Other Ambulatory Visit: Payer: Self-pay | Admitting: Family Medicine

## 2019-09-17 DIAGNOSIS — I10 Essential (primary) hypertension: Secondary | ICD-10-CM

## 2019-09-17 LAB — SURGICAL PATHOLOGY

## 2019-10-02 ENCOUNTER — Ambulatory Visit (INDEPENDENT_AMBULATORY_CARE_PROVIDER_SITE_OTHER): Payer: Managed Care, Other (non HMO) | Admitting: Urology

## 2019-10-02 ENCOUNTER — Telehealth: Payer: Managed Care, Other (non HMO) | Admitting: Urology

## 2019-10-02 ENCOUNTER — Ambulatory Visit: Payer: Managed Care, Other (non HMO) | Admitting: Urology

## 2019-10-02 ENCOUNTER — Telehealth: Payer: Self-pay

## 2019-10-02 ENCOUNTER — Encounter: Payer: Self-pay | Admitting: Urology

## 2019-10-02 ENCOUNTER — Other Ambulatory Visit: Payer: Self-pay

## 2019-10-02 VITALS — BP 149/95 | HR 64 | Ht 67.0 in | Wt 243.0 lb

## 2019-10-02 DIAGNOSIS — C674 Malignant neoplasm of posterior wall of bladder: Secondary | ICD-10-CM | POA: Diagnosis not present

## 2019-10-02 NOTE — Patient Instructions (Signed)
Bladder Cancer  Bladder cancer is an abnormal growth of tissue in the bladder. The bladder is the balloon-like sac in the pelvis. It collects and stores urine that comes from the kidneys through the ureters. The bladder wall is made of layers. If cancer spreads into these layers and through the wall of the bladder, it becomes more difficult to treat. What are the causes? The cause of this condition is not known. What increases the risk? The following factors may make you more likely to develop this condition:  Smoking.  Workplace risks (occupational exposures), such as rubber, leather, textile, dyes, chemicals, and paint.  Being white.  Your age. Most people with bladder cancer are over the age of 55.  Being female.  Having chronic bladder inflammation.  Having a personal history of bladder cancer.  Having a family history of bladder cancer (heredity).  Having had chemotherapy or radiation therapy to the pelvis.  Having been exposed to arsenic. What are the signs or symptoms? Initial symptoms of this condition include:  Blood in the urine.  Painful urination.  Frequent bladder or urine infections.  Increase in urgency and frequency of urination. Advanced symptoms of this condition include:  Not being able to urinate.  Low back pain on one side.  Loss of appetite.  Weight loss.  Fatigue.  Swelling in the feet.  Bone pain. How is this diagnosed? This condition is diagnosed based on your medical history, a physical exam, urine tests, lab tests, imaging tests, and your symptoms. You may also have other tests or procedures done, such as:  A narrow tube being inserted into your bladder through your urethra (cystoscopy) in order to view the lining of your bladder for tumors.  A biopsy to sample the tumor to see if cancer is present. If cancer is present, it will then be staged to determine its severity and extent. Staging is an assessment of:  The size of the  tumor.  Whether the cancer has spread.  Where the cancer has spread. It is important to know how deeply into the bladder wall cancer has grown and whether cancer has spread to any other parts of your body. Staging may require blood tests or imaging tests, such as a CT scan, MRI, bone scan, or chest X-ray. How is this treated? Based on the stage of cancer, one treatment or a combination of treatments may be recommended. The most common forms of treatment are:  Surgery to remove the cancer. Procedures that may be done include transurethral resection and cystectomy.  Radiation therapy. This is high-energy X-rays or other particles. This is often used in combination with chemotherapy.  Chemotherapy. During this treatment, medicines are used to kill cancer cells.  Immunotherapy. This uses medicines to help your own immune system destroy cancer cells. Follow these instructions at home:  Take over-the-counter and prescription medicines only as told by your health care provider.  Maintain a healthy diet. Some of your treatments might affect your appetite.  Consider joining a support group. This may help you learn to cope with the stress of having bladder cancer.  Tell your cancer care team if you develop side effects. They may be able to recommend ways to relieve them.  Keep all follow-up visits as told by your health care provider. This is important. Where to find more information  American Cancer Society: www.cancer.org  National Cancer Institute (NCI): www.cancer.gov Contact a health care provider if:  You have symptoms of a urinary tract infection. These include: ?   Fever. ? Chills. ? Weakness. ? Muscle aches. ? Abdominal pain. ? Frequent and intense urge to urinate. ? Burning feeling in the bladder or urethra during urination. Get help right away if:  There is blood in your urine.  You cannot urinate.  You have severe pain or other symptoms that do not go  away. Summary  Bladder cancer is an abnormal growth of tissue in the bladder.  This condition is diagnosed based on your medical history, a physical exam, urine tests, lab tests, imaging tests, and your symptoms.  Based on the stage of cancer, surgery, chemotherapy, or a combination of treatments may be recommended.  Consider joining a support group. This may help you learn to cope with the stress of having bladder cancer. This information is not intended to replace advice given to you by your health care provider. Make sure you discuss any questions you have with your health care provider. Document Revised: 07/22/2017 Document Reviewed: 07/13/2016 Elsevier Patient Education  2020 Elsevier Inc.   Bacillus Calmette-Guerin Live, BCG intravesical solution What is this medicine? BACILLUS CALMETTE-GUERIN LIVE, BCG (ba SIL us KAL met gay RAYN) is a bacteria solution. This medicine stimulates the immune system to ward off cancer cells. It is used to treat bladder cancer. This medicine may be used for other purposes; ask your health care provider or pharmacist if you have questions. COMMON BRAND NAME(S): Theracys, TICE BCG What should I tell my health care provider before I take this medicine? They need to know if you have any of these conditions:  aneurysm  blood in the urine  bladder biopsy within 2 weeks  fever or infection  immune system problems  leukemia  lymphoma  myasthenia gravis  need organ transplant  prosthetic device like arterial graft, artificial joint, prosthetic heart valve  recent or ongoing radiation therapy  tuberculosis  an unusual or allergic reaction to Bacillus Calmette-Guerin Live, BCG, latex, other medicines, foods, dyes, or preservatives  pregnant or trying to get pregnant  breast-feeding How should I use this medicine? This drug is given as a catheter infusion into the bladder. It is administered in a hospital or clinic by a specially trained  health care provider. You will be given directions to follow before the treatment. Follow your health care provider's directions carefully. This medicine contains live bacteria. It is very important to follow these directions closely after treatment to prevent others from coming in contact with your urine. Your health care provider may give you additional directions to follow. Try to hold this medicine in your bladder for 1 to 2 hours. Follow these directions the first time you go to the bathroom and for 6 hours after the first void.  Wash your hands before using the restroom. After voiding, wash your hands and genital area.  Use a toilet and sit when going to the bathroom. This helps to prevent the urine from splashing. Do not use public toilets or void outside.  After each void, add 2 cups of undiluted bleach to the toilet. Close the lid. Wait 15 to 20 minutes and then flush the toilet.  After the first void, drink more fluids to help dilute your urine.  If you have urinary incontinence, wash the clothes you were wearing in the washer immediately. Do not wash other clothes at the same time.  If you are wearing an incontinence pad, pour bleach on the pad and allow it to soak into the pad before throwing it away. Put the pad in a   plastic bag and put it in the trash. Talk to your pediatrician regarding the use of this medicine in children. Special care may be needed. Overdosage: If you think you have taken too much of this medicine contact a poison control center or emergency room at once. NOTE: This medicine is only for you. Do not share this medicine with others. What if I miss a dose? It is important not to miss your dose. Call your doctor or health care professional if you are unable to keep an appointment. What may interact with this medicine?  antibiotics  medicines to suppress your immune system like chemotherapy agents or corticosteroids  medicine to treat tuberculosis This list may  not describe all possible interactions. Give your health care provider a list of all the medicines, herbs, non-prescription drugs, or dietary supplements you use. Also tell them if you smoke, drink alcohol, or use illegal drugs. Some items may interact with your medicine. What should I watch for while using this medicine? Visit your health care provider for checks on your progress. This medicine may make you feel generally unwell. Contact your health care provider if your symptoms last more than 2 days or if they get worse. Call your health care provider right away if you have a severe or unusual symptom. Infection can be spread to others through contact with this medicine. To prevent the spread of infection, follow your health care provider's directions carefully after treatment. Do not become pregnant while taking this medicine. There is a potential for serious side effects to an unborn child. Talk to your health care provider for more information. Do not breast-feed an infant while taking this medicine. If you have sex while on this medicine, use a condom. Ask your health care provider how long you should use a condom. What side effects may I notice from receiving this medicine? Side effects that you should report to your doctor or health care professional as soon as possible:  allergic reactions like skin rash, itching or hives, swelling of the face, lips, or tongue  signs of infection - fever or chills, cough, sore throat, pain or difficulty passing urine  signs of decreased red blood cells - unusually weak or tired, fainting spells, lightheadedness  blood in urine  breathing problems  cough  eye pain, redness  flu-like symptoms  joint pain  bladder-area pain for more than 2 days after treatment  trouble passing urine or change in the amount of urine  vomiting  yellowing of the eyes or skin Side effects that usually do not require medical attention (report to your doctor or  health care professional if they continue or are bothersome):  bladder spasm  burning when passing urine within 2 days of treatment  feel need to pass urine often or wake up at night to pass urine  loss of appetite This list may not describe all possible side effects. Call your doctor for medical advice about side effects. You may report side effects to FDA at 1-800-FDA-1088. Where should I keep my medicine? This drug is given in a hospital or clinic and will not be stored at home. NOTE: This sheet is a summary. It may not cover all possible information. If you have questions about this medicine, talk to your doctor, pharmacist, or health care provider.  2020 Elsevier/Gold Standard (2018-10-13 12:05:29)  

## 2019-10-02 NOTE — Telephone Encounter (Signed)
BCG in 2-3 weeks followed by first surveillance cysto ~3 months after finishing induction BCG. Will need mBCG as well to follow

## 2019-10-02 NOTE — Progress Notes (Signed)
   10/02/2019 1:15 PM   MIRIELLE EATHERLY 01-Apr-1961 [id number]  Reason for visit: Discuss TURBT pathology  HPI: I saw Ms. Mikolajczak back in urology clinic today to discuss her pathology from second look TURBT.  She is a healthy 59 year old non-smoker who originally presented in December 2020 with gross hematuria and was found to have a 2 cm bladder tumor with no evidence of metastatic disease.  Initial TURBT on 08/10/2019 showed HG T1 papillary urothelial cell carcinoma.  Muscle was present and not involved with tumor.  She underwent second look TURBT on 09/14/2019 and pathology showed only benign urothelial tissue and no evidence of malignancy.  Muscle was again present and not involved.  She has been doing well and denies any complaints today, specifically any gross hematuria, dysuria, or abdominal pain.  We a long conversation about her diagnosis of nonmuscle invasive bladder cancer and treatment strategies moving forward.  We discussed BCG at length and the need for induction and maintenance BCG to decrease the risk of recurrence.  We also discussed the need for surveillance cystoscopy every 3 to 4 months for the first 2 years, then by biannually, and ultimately annually.  Induction BCG followed by maintenance, 3 years total Surveillance cysto after iBCG  A total of 30 minutes were spent face-to-face with the patient, greater than 50% was spent in patient education, counseling, and coordination of care regarding bladder cancer, BCG, and treatment strategies.  Billey Co, [facility] [address] [place], [place] [address] [phone]

## 2019-10-04 NOTE — Telephone Encounter (Signed)
BCG was scheduled and instructions were given in detail to patient and sent via my chart for reference

## 2019-10-15 ENCOUNTER — Encounter: Payer: Self-pay | Admitting: Family Medicine

## 2019-10-15 ENCOUNTER — Ambulatory Visit (INDEPENDENT_AMBULATORY_CARE_PROVIDER_SITE_OTHER): Payer: Managed Care, Other (non HMO) | Admitting: Family Medicine

## 2019-10-15 VITALS — Ht 67.5 in | Wt 240.0 lb

## 2019-10-15 DIAGNOSIS — I10 Essential (primary) hypertension: Secondary | ICD-10-CM

## 2019-10-15 DIAGNOSIS — K5900 Constipation, unspecified: Secondary | ICD-10-CM | POA: Insufficient documentation

## 2019-10-15 DIAGNOSIS — Z6837 Body mass index (BMI) 37.0-37.9, adult: Secondary | ICD-10-CM

## 2019-10-15 DIAGNOSIS — E669 Obesity, unspecified: Secondary | ICD-10-CM | POA: Insufficient documentation

## 2019-10-15 MED ORDER — AMLODIPINE BESYLATE 10 MG PO TABS: 10.0000 mg | ORAL_TABLET | Freq: Every day | ORAL | 1 refills | Status: DC

## 2019-10-15 NOTE — Assessment & Plan Note (Signed)
Discussed importance of healthy weight management Discussed diet and exercise  

## 2019-10-15 NOTE — Progress Notes (Signed)
Patient: Christina Holloway Female    DOB: [birth year]   59 y.o.   MRN: [id number] Visit Date: 10/15/2019  Today's Provider: Lavon Paganini, MD   Chief Complaint  Patient presents with  . Hypertension   Subjective:    I [place] S. Dimas, CMA, am acting as scribe for Lavon Paganini, MD.  Virtual Visit via Telephone Note  I connected with AIRYANA HARGUS on 10/15/19 at  1:20 PM EST by telephone and verified that I am speaking with the correct person using two identifiers.  Location: Patient location: home Provider location: home office  Persons involved in the visit: patient, provider   I discussed the limitations, risks, security and privacy concerns of performing an evaluation and management service by telephone and the availability of in person appointments. I also discussed with the patient that there may be a patient responsible charge related to this service. The patient expressed understanding and agreed to proceed.  HPI  Hypertension, follow-up:  BP Readings from Last 3 Encounters:  10/02/19 (!) 149/95  09/14/19 108/73  09/07/19 140/82    She was last seen for hypertension 6 weeks ago.  BP at that visit was 158/90. Management changes since that visit include adding Amlodipine 5mg  daily. She reports excellent compliance with treatment. She is having side effects. nausea She is exercising. She is adherent to low salt diet.   Outside blood pressures are still elevated around [id number] systolic at Urology. She is experiencing none.  Patient denies chest pain and lower extremity edema.   Cardiovascular risk factors include dyslipidemia and hypertension.  Use of agents associated with hypertension: none.     Weight trend: stable Wt Readings from Last 3 Encounters:  10/15/19 240 lb (108.9 kg)  10/02/19 243 lb (110.2 kg)  09/14/19 238 lb (108 kg)    Current diet: in general, a "healthy" diet     ------------------------------------------------------------------------   Allergies  Allergen Reactions  . Penicillins Rash    Did it involve swelling of the face/tongue/throat, SOB, or low BP? No Did it involve sudden or severe rash/hives, skin peeling, or any reaction on the inside of your mouth or nose? No Did you need to seek medical attention at a hospital or doctor's office? No When did it last happen?childhood allergy If all above answers are "NO", may proceed with cephalosporin use.      Current Outpatient Medications:  .  amLODipine (NORVASC) 5 MG tablet, Take 1 tablet (5 mg total) by mouth daily. (Patient taking differently: Take 5 mg by mouth every morning. ), Disp: 90 tablet, Rfl: 3 .  atenolol (TENORMIN) 25 MG tablet, Take 1 tablet (25 mg total) by mouth every morning., Disp: 90 tablet, Rfl: 1 .  colchicine 0.6 MG tablet, Take 1.2mg  PO x1 at first sign of gout flare, then 0.6mg  1 hour later. Continue 0.6mg  BID until 48 hours after resolution of flare. (Patient taking differently: Take 0.6 mg by mouth daily as needed (gout). ), Disp: 30 tablet, Rfl: 1 .  docusate sodium (COLACE) 100 MG capsule, Take 100 mg by mouth daily., Disp: , Rfl:  .  losartan-hydrochlorothiazide (HYZAAR) 100-12.5 MG tablet, Take 1 tablet by mouth every morning., Disp: 90 tablet, Rfl: 1  Review of Systems  Constitutional: Negative for chills, fatigue, fever and unexpected weight change.  Respiratory: Negative for cough and shortness of breath.   Cardiovascular: Negative for chest pain, palpitations and leg swelling.  Gastrointestinal: Positive for nausea.    Social History  Tobacco Use  . Smoking status: Never Smoker  . Smokeless tobacco: Never Used  Substance Use Topics  . Alcohol use: Yes    Alcohol/week: 36.0 standard drinks    Types: 36 Cans of beer per week      Objective:   Ht 5' 7.5" (1.715 m)   Wt 240 lb (108.9 kg)   BMI 37.03 kg/m  Vitals:   10/15/19 0939  Weight:  240 lb (108.9 kg)  Height: 5' 7.5" (1.715 m)  Body mass index is 37.03 kg/m.   Physical Exam Speaking in full sentences in NAD  No results found for any visits on 10/15/19.     Assessment & Plan      I discussed the assessment and treatment plan with the patient. The patient was provided an opportunity to ask questions and all were answered. The patient agreed with the plan and demonstrated an understanding of the instructions.   The patient was advised to call back or seek an in-person evaluation if the symptoms worsen or if the condition fails to improve as anticipated.   Problem List Items Addressed This Visit      Cardiovascular and Mediastinum   Essential (primary) hypertension - Primary    Uncontrolled at recent urology appts Encouraged home measurements Continue current medications, except increasing amlodipine to 10 mg daily Reviewed recent metabolic panel Follow-up in 1 month      Relevant Medications   amLODipine (NORVASC) 10 MG tablet     Other   Obesity    Discussed importance of healthy weight management Discussed diet and exercise       Constipation    New problem over the last few months Her left sided flank pain is likely related to her constipation Encourage starting MiraLAX 1 capful daily and titrating to 1 soft bowel movement daily Return precautions discussed Follow-up in 1 month for follow-up          Return in about 4 weeks (around 11/12/2019) for chronic disease f/u.   The entirety of the information documented in the History of Present Illness, Review of Systems and Physical Exam were personally obtained by me. Portions of this information were initially documented by Lynford Humphrey, CMA and reviewed by me for thoroughness and accuracy.    Georgio Hattabaugh, Dionne Bucy, MD MPH [facility]

## 2019-10-15 NOTE — Assessment & Plan Note (Signed)
New problem over the last few months Her left sided flank pain is likely related to her constipation Encourage starting MiraLAX 1 capful daily and titrating to 1 soft bowel movement daily Return precautions discussed Follow-up in 1 month for follow-up

## 2019-10-15 NOTE — Assessment & Plan Note (Signed)
Uncontrolled at recent urology appts Encouraged home measurements Continue current medications, except increasing amlodipine to 10 mg daily Reviewed recent metabolic panel Follow-up in 1 month

## 2019-10-15 NOTE — Patient Instructions (Signed)
DASH Eating Plan DASH stands for "Dietary Approaches to Stop Hypertension." The DASH eating plan is a healthy eating plan that has been shown to reduce high blood pressure (hypertension). It may also reduce your risk for type 2 diabetes, heart disease, and stroke. The DASH eating plan may also help with weight loss. What are tips for following this plan?  General guidelines  Avoid eating more than 2,300 mg (milligrams) of salt (sodium) a day. If you have hypertension, you may need to reduce your sodium intake to 1,500 mg a day.  Limit alcohol intake to no more than 1 drink a day for nonpregnant women and 2 drinks a day for men. One drink equals 12 oz of beer, 5 oz of wine, or 1 oz of hard liquor.  Work with your health care provider to maintain a healthy body weight or to lose weight. Ask what an ideal weight is for you.  Get at least 30 minutes of exercise that causes your heart to beat faster (aerobic exercise) most days of the week. Activities may include walking, swimming, or biking.  Work with your health care provider or diet and nutrition specialist (dietitian) to adjust your eating plan to your individual calorie needs. Reading food labels   Check food labels for the amount of sodium per serving. Choose foods with less than 5 percent of the Daily Value of sodium. Generally, foods with less than 300 mg of sodium per serving fit into this eating plan.  To find whole grains, look for the word "whole" as the first word in the ingredient list. Shopping  Buy products labeled as "low-sodium" or "no salt added."  Buy fresh foods. Avoid canned foods and premade or frozen meals. Cooking  Avoid adding salt when cooking. Use salt-free seasonings or herbs instead of table salt or sea salt. Check with your health care provider or pharmacist before using salt substitutes.  Do not fry foods. Cook foods using healthy methods such as baking, boiling, grilling, and broiling instead.  Cook with  heart-healthy oils, such as olive, canola, soybean, or sunflower oil. Meal planning  Eat a balanced diet that includes: ? 5 or more servings of fruits and vegetables each day. At each meal, try to fill half of your plate with fruits and vegetables. ? Up to 6-8 servings of whole grains each day. ? Less than 6 oz of lean meat, poultry, or fish each day. A 3-oz serving of meat is about the same size as a deck of cards. One egg equals 1 oz. ? 2 servings of low-fat dairy each day. ? A serving of nuts, seeds, or beans 5 times each week. ? Heart-healthy fats. Healthy fats called Omega-3 fatty acids are found in foods such as flaxseeds and coldwater fish, like sardines, salmon, and mackerel.  Limit how much you eat of the following: ? Canned or prepackaged foods. ? Food that is high in trans fat, such as fried foods. ? Food that is high in saturated fat, such as fatty meat. ? Sweets, desserts, sugary drinks, and other foods with added sugar. ? Full-fat dairy products.  Do not salt foods before eating.  Try to eat at least 2 vegetarian meals each week.  Eat more home-cooked food and less restaurant, buffet, and fast food.  When eating at a restaurant, ask that your food be prepared with less salt or no salt, if possible. What foods are recommended? The items listed may not be a complete list. Talk with your dietitian about   what dietary choices are best for you. Grains Whole-grain or whole-wheat bread. Whole-grain or whole-wheat pasta. Brown rice. Oatmeal. Quinoa. Bulgur. Whole-grain and low-sodium cereals. Pita bread. Low-fat, low-sodium crackers. Whole-wheat flour tortillas. Vegetables Fresh or frozen vegetables (raw, steamed, roasted, or grilled). Low-sodium or reduced-sodium tomato and vegetable juice. Low-sodium or reduced-sodium tomato sauce and tomato paste. Low-sodium or reduced-sodium canned vegetables. Fruits All fresh, dried, or frozen fruit. Canned fruit in natural juice (without  added sugar). Meat and other protein foods Skinless chicken or turkey. Ground chicken or turkey. Pork with fat trimmed off. Fish and seafood. Egg whites. Dried beans, peas, or lentils. Unsalted nuts, nut butters, and seeds. Unsalted canned beans. Lean cuts of beef with fat trimmed off. Low-sodium, lean deli meat. Dairy Low-fat (1%) or fat-free (skim) milk. Fat-free, low-fat, or reduced-fat cheeses. Nonfat, low-sodium ricotta or cottage cheese. Low-fat or nonfat yogurt. Low-fat, low-sodium cheese. Fats and oils Soft margarine without trans fats. Vegetable oil. Low-fat, reduced-fat, or light mayonnaise and salad dressings (reduced-sodium). Canola, safflower, olive, soybean, and sunflower oils. Avocado. Seasoning and other foods Herbs. Spices. Seasoning mixes without salt. Unsalted popcorn and pretzels. Fat-free sweets. What foods are not recommended? The items listed may not be a complete list. Talk with your dietitian about what dietary choices are best for you. Grains Baked goods made with fat, such as croissants, muffins, or some breads. Dry pasta or rice meal packs. Vegetables Creamed or fried vegetables. Vegetables in a cheese sauce. Regular canned vegetables (not low-sodium or reduced-sodium). Regular canned tomato sauce and paste (not low-sodium or reduced-sodium). Regular tomato and vegetable juice (not low-sodium or reduced-sodium). Pickles. Olives. Fruits Canned fruit in a light or heavy syrup. Fried fruit. Fruit in cream or butter sauce. Meat and other protein foods Fatty cuts of meat. Ribs. Fried meat. Bacon. Sausage. Bologna and other processed lunch meats. Salami. Fatback. Hotdogs. Bratwurst. Salted nuts and seeds. Canned beans with added salt. Canned or smoked fish. Whole eggs or egg yolks. Chicken or turkey with skin. Dairy Whole or 2% milk, cream, and half-and-half. Whole or full-fat cream cheese. Whole-fat or sweetened yogurt. Full-fat cheese. Nondairy creamers. Whipped toppings.  Processed cheese and cheese spreads. Fats and oils Butter. Stick margarine. Lard. Shortening. Ghee. Bacon fat. Tropical oils, such as coconut, palm kernel, or palm oil. Seasoning and other foods Salted popcorn and pretzels. Onion salt, garlic salt, seasoned salt, table salt, and sea salt. Worcestershire sauce. Tartar sauce. Barbecue sauce. Teriyaki sauce. Soy sauce, including reduced-sodium. Steak sauce. Canned and packaged gravies. Fish sauce. Oyster sauce. Cocktail sauce. Horseradish that you find on the shelf. Ketchup. Mustard. Meat flavorings and tenderizers. Bouillon cubes. Hot sauce and Tabasco sauce. Premade or packaged marinades. Premade or packaged taco seasonings. Relishes. Regular salad dressings. Where to find more information:  National Heart, Lung, and Blood Institute: www.nhlbi.nih.gov  American Heart Association: www.heart.org Summary  The DASH eating plan is a healthy eating plan that has been shown to reduce high blood pressure (hypertension). It may also reduce your risk for type 2 diabetes, heart disease, and stroke.  With the DASH eating plan, you should limit salt (sodium) intake to 2,300 mg a day. If you have hypertension, you may need to reduce your sodium intake to 1,500 mg a day.  When on the DASH eating plan, aim to eat more fresh fruits and vegetables, whole grains, lean proteins, low-fat dairy, and heart-healthy fats.  Work with your health care provider or diet and nutrition specialist (dietitian) to adjust your eating plan to your   individual calorie needs. This information is not intended to replace advice given to you by your health care provider. Make sure you discuss any questions you have with your health care provider. Document Revised: 07/22/2017 Document Reviewed: 08/02/2016 Elsevier Patient Education  2020 Elsevier Inc.  

## 2019-10-20 ENCOUNTER — Ambulatory Visit: Payer: Self-pay | Admitting: Family Medicine

## 2019-10-29 ENCOUNTER — Other Ambulatory Visit
Admission: RE | Admit: 2019-10-29 | Discharge: 2019-10-29 | Disposition: A | Discharge status: Home / Self Care | Payer: Managed Care, Other (non HMO) | Attending: Urology | Admitting: Urology

## 2019-10-29 ENCOUNTER — Encounter: Payer: Self-pay | Admitting: Urology

## 2019-10-29 ENCOUNTER — Other Ambulatory Visit: Payer: Self-pay

## 2019-10-29 ENCOUNTER — Ambulatory Visit: Payer: Self-pay | Admitting: Urology

## 2019-10-29 ENCOUNTER — Ambulatory Visit (INDEPENDENT_AMBULATORY_CARE_PROVIDER_SITE_OTHER): Payer: Managed Care, Other (non HMO) | Admitting: Urology

## 2019-10-29 VITALS — BP 143/86 | HR 64 | Ht 67.0 in | Wt 240.0 lb

## 2019-10-29 DIAGNOSIS — C674 Malignant neoplasm of posterior wall of bladder: Secondary | ICD-10-CM | POA: Insufficient documentation

## 2019-10-29 LAB — URINALYSIS, COMPLETE (UACMP) WITH MICROSCOPIC
Bilirubin Urine: NEGATIVE
Glucose, UA: NEGATIVE mg/dL
Ketones, ur: NEGATIVE mg/dL
Nitrite: NEGATIVE
Protein, ur: NEGATIVE mg/dL
Specific Gravity, Urine: 1.025 (ref 1.005–1.030)
pH: 5.5 (ref 5.0–8.0)

## 2019-10-29 MED ORDER — BCG LIVE 50 MG IS SUSR
3.2400 mL | Freq: Once | INTRAVESICAL | Status: AC
  Administered 2019-10-29: 81 mg via INTRAVESICAL

## 2019-10-29 NOTE — Patient Instructions (Signed)

## 2019-10-31 NOTE — Progress Notes (Signed)
BCG Bladder Instillation  BCG # 1  Due to Bladder Cancer patient is present today for a BCG treatment. Patient was cleaned and prepped in a sterile fashion with betadine. A 14 FR catheter was inserted, urine return was noted 40 mL, urine was yellow in color.  88ml of reconstituted BCG was instilled into the bladder. The catheter was then removed. Patient tolerated well, no complications were noted  Performed by: Zara Council, PA-C

## 2019-11-01 ENCOUNTER — Other Ambulatory Visit: Payer: Self-pay | Admitting: Family Medicine

## 2019-11-01 DIAGNOSIS — C674 Malignant neoplasm of posterior wall of bladder: Secondary | ICD-10-CM

## 2019-11-05 ENCOUNTER — Other Ambulatory Visit: Payer: Self-pay

## 2019-11-05 ENCOUNTER — Other Ambulatory Visit
Admission: RE | Admit: 2019-11-05 | Discharge: 2019-11-05 | Disposition: A | Discharge status: Home / Self Care | Payer: Managed Care, Other (non HMO) | Attending: Urology | Admitting: Urology

## 2019-11-05 ENCOUNTER — Ambulatory Visit: Payer: Managed Care, Other (non HMO) | Admitting: Urology

## 2019-11-05 DIAGNOSIS — C674 Malignant neoplasm of posterior wall of bladder: Secondary | ICD-10-CM

## 2019-11-05 LAB — URINALYSIS, COMPLETE (UACMP) WITH MICROSCOPIC
Bacteria, UA: NONE SEEN
Bilirubin Urine: NEGATIVE
Glucose, UA: NEGATIVE mg/dL
Ketones, ur: NEGATIVE mg/dL
Leukocytes,Ua: NEGATIVE
Nitrite: NEGATIVE
Protein, ur: NEGATIVE mg/dL
Specific Gravity, Urine: 1.015 (ref 1.005–1.030)
Squamous Epithelial / HPF: NONE SEEN (ref 0–5)
WBC, UA: NONE SEEN WBC/hpf (ref 0–5)
pH: 7 (ref 5.0–8.0)

## 2019-11-05 MED ORDER — BCG LIVE 50 MG IS SUSR
3.2400 mL | Freq: Once | INTRAVESICAL | Status: AC
  Administered 2019-11-05: 81 mg via INTRAVESICAL

## 2019-11-05 NOTE — Progress Notes (Signed)
BCG Bladder Instillation  BCG # 2  Due to Bladder Cancer patient is present today for a BCG treatment. Patient was cleaned and prepped in a sterile fashion with betadine. A 14 FR catheter was inserted, urine return was noted 50 ml, urine was yellow clear in color.  69ml of reconstituted BCG was instilled into the bladder. The catheter was then removed. Patient tolerated well, no complications were noted  Performed by: Zara Council, PA-C and Kyra Manges, CMA  Follow up/ Additional notes: one week for BCG # 3

## 2019-11-08 ENCOUNTER — Other Ambulatory Visit: Payer: Self-pay | Admitting: Family Medicine

## 2019-11-08 DIAGNOSIS — C674 Malignant neoplasm of posterior wall of bladder: Secondary | ICD-10-CM

## 2019-11-12 ENCOUNTER — Other Ambulatory Visit: Payer: Self-pay

## 2019-11-12 ENCOUNTER — Other Ambulatory Visit
Admission: RE | Admit: 2019-11-12 | Discharge: 2019-11-12 | Disposition: A | Discharge status: Home / Self Care | Payer: Managed Care, Other (non HMO) | Attending: Urology | Admitting: Urology

## 2019-11-12 ENCOUNTER — Ambulatory Visit: Payer: Managed Care, Other (non HMO) | Admitting: Urology

## 2019-11-12 DIAGNOSIS — C674 Malignant neoplasm of posterior wall of bladder: Secondary | ICD-10-CM | POA: Diagnosis present

## 2019-11-12 LAB — URINALYSIS, COMPLETE (UACMP) WITH MICROSCOPIC
Bilirubin Urine: NEGATIVE
Glucose, UA: NEGATIVE mg/dL
Ketones, ur: NEGATIVE mg/dL
Leukocytes,Ua: NEGATIVE
Nitrite: NEGATIVE
Protein, ur: NEGATIVE mg/dL
Specific Gravity, Urine: 1.025 (ref 1.005–1.030)
pH: 5.5 (ref 5.0–8.0)

## 2019-11-12 MED ORDER — BCG LIVE 50 MG IS SUSR
3.2400 mL | Freq: Once | INTRAVESICAL | Status: AC
  Administered 2019-11-12: 81 mg via INTRAVESICAL

## 2019-11-12 NOTE — Progress Notes (Signed)
BCG Bladder Instillation  BCG # 3  Due to Bladder Cancer patient is present today for a BCG treatment. Patient was cleaned and prepped in a sterile fashion with betadine. A 14 FR catheter was inserted, urine return was noted 15 ml, urine was yellow clear in color.  43ml of reconstituted BCG was instilled into the bladder. The catheter was then removed. Patient tolerated well, no complications were noted  Performed by: Zara Council, PA-C and Tommy Rainwater, CMA   Follow up/ Additional notes: One week for BCG #4

## 2019-11-15 ENCOUNTER — Other Ambulatory Visit: Payer: Self-pay

## 2019-11-15 DIAGNOSIS — C674 Malignant neoplasm of posterior wall of bladder: Secondary | ICD-10-CM

## 2019-11-19 ENCOUNTER — Ambulatory Visit: Payer: Self-pay | Admitting: Urology

## 2019-11-19 ENCOUNTER — Ambulatory Visit (INDEPENDENT_AMBULATORY_CARE_PROVIDER_SITE_OTHER): Payer: Managed Care, Other (non HMO) | Admitting: Physician Assistant

## 2019-11-19 ENCOUNTER — Other Ambulatory Visit: Payer: Self-pay

## 2019-11-19 VITALS — BP 133/87 | HR 65

## 2019-11-19 DIAGNOSIS — D494 Neoplasm of unspecified behavior of bladder: Secondary | ICD-10-CM | POA: Diagnosis not present

## 2019-11-19 LAB — MICROSCOPIC EXAMINATION: Bacteria, UA: NONE SEEN

## 2019-11-19 LAB — URINALYSIS, COMPLETE
Bilirubin, UA: NEGATIVE
Glucose, UA: NEGATIVE
Ketones, UA: NEGATIVE
Leukocytes,UA: NEGATIVE
Nitrite, UA: NEGATIVE
Protein,UA: NEGATIVE
Specific Gravity, UA: 1.01 (ref 1.005–1.030)
Urobilinogen, Ur: 0.2 mg/dL (ref 0.2–1.0)
pH, UA: 5.5 (ref 5.0–7.5)

## 2019-11-19 MED ORDER — BCG LIVE 50 MG IS SUSR
3.2400 mL | Freq: Once | INTRAVESICAL | Status: AC
  Administered 2019-11-19: 81 mg via INTRAVESICAL

## 2019-11-19 NOTE — Progress Notes (Signed)
BCG Bladder Instillation  BCG # 4  Due to Bladder Cancer patient is present today for a BCG treatment. Patient was cleaned and prepped in a sterile fashion with betadine. A 14FR catheter was inserted, urine return was noted 39ml, urine was yellow in color.  59ml of reconstituted BCG was instilled into the bladder. The catheter was then removed. Patient tolerated well, no complications were noted  Performed by: Debroah Loop, PA-C, Gaspar Cola, CMA, and Kerman Passey, RMA  Follow up/ Additional notes: 1 week for BCG #5

## 2019-11-20 ENCOUNTER — Other Ambulatory Visit: Payer: Self-pay

## 2019-11-20 DIAGNOSIS — D494 Neoplasm of unspecified behavior of bladder: Secondary | ICD-10-CM

## 2019-11-26 ENCOUNTER — Other Ambulatory Visit: Payer: Self-pay

## 2019-11-26 ENCOUNTER — Other Ambulatory Visit
Admission: RE | Admit: 2019-11-26 | Discharge: 2019-11-26 | Disposition: A | Discharge status: Home / Self Care | Payer: Managed Care, Other (non HMO) | Attending: Urology | Admitting: Urology

## 2019-11-26 ENCOUNTER — Ambulatory Visit: Payer: Managed Care, Other (non HMO) | Admitting: Urology

## 2019-11-26 DIAGNOSIS — D494 Neoplasm of unspecified behavior of bladder: Secondary | ICD-10-CM | POA: Diagnosis present

## 2019-11-26 LAB — URINALYSIS, COMPLETE (UACMP) WITH MICROSCOPIC
Bilirubin Urine: NEGATIVE
Glucose, UA: NEGATIVE mg/dL
Ketones, ur: NEGATIVE mg/dL
Nitrite: NEGATIVE
Protein, ur: NEGATIVE mg/dL
Specific Gravity, Urine: 1.025 (ref 1.005–1.030)
pH: 5.5 (ref 5.0–8.0)

## 2019-11-26 MED ORDER — BCG LIVE 50 MG IS SUSR
3.2400 mL | Freq: Once | INTRAVESICAL | Status: AC
  Administered 2019-11-26: 81 mg via INTRAVESICAL

## 2019-11-26 NOTE — Progress Notes (Signed)
BCG Bladder Instillation  BCG # 5  Due to Bladder Cancer patient is present today for a BCG treatment. Patient was cleaned and prepped in a sterile fashion with betadine. A 14 FR catheter was inserted, urine return was noted 10 ml, urine was yellow in color.  70ml of reconstituted BCG was instilled into the bladder. The catheter was then removed. Patient tolerated well, no complications were noted  Performed by: Zara Council, PA-C and Kyra Manges, CMA  Follow up/ Additional notes: Follow up in one week for # 6 BCG

## 2019-11-26 NOTE — Progress Notes (Signed)
BCG Bladder Instillation  BCG # 5  Due to Bladder Cancer patient is present today for a BCG treatment. Patient was cleaned and prepped in a sterile fashion with betadine. A 14FR catheter was inserted, urine return was noted 38ml, urine was yellow in color.  87ml of reconstituted BCG was instilled into the bladder. The catheter was then removed. Patient tolerated well, no complications were noted  Preformed by: Elberta Leatherwood, CMA, Zara Council, PA-C  Follow up/ Additional notes: 1 week #6

## 2019-12-03 ENCOUNTER — Other Ambulatory Visit: Payer: Self-pay

## 2019-12-03 ENCOUNTER — Ambulatory Visit (INDEPENDENT_AMBULATORY_CARE_PROVIDER_SITE_OTHER): Payer: Managed Care, Other (non HMO) | Admitting: Urology

## 2019-12-03 ENCOUNTER — Other Ambulatory Visit
Admission: RE | Admit: 2019-12-03 | Discharge: 2019-12-03 | Disposition: A | Discharge status: Home / Self Care | Payer: Managed Care, Other (non HMO) | Attending: Urology | Admitting: Urology

## 2019-12-03 DIAGNOSIS — D494 Neoplasm of unspecified behavior of bladder: Secondary | ICD-10-CM | POA: Diagnosis not present

## 2019-12-03 DIAGNOSIS — C674 Malignant neoplasm of posterior wall of bladder: Secondary | ICD-10-CM

## 2019-12-03 LAB — URINALYSIS, COMPLETE (UACMP) WITH MICROSCOPIC
Bacteria, UA: NONE SEEN
Bilirubin Urine: NEGATIVE
Glucose, UA: NEGATIVE mg/dL
Ketones, ur: NEGATIVE mg/dL
Nitrite: NEGATIVE
Protein, ur: NEGATIVE mg/dL
Specific Gravity, Urine: 1.015 (ref 1.005–1.030)
Squamous Epithelial / HPF: NONE SEEN (ref 0–5)
pH: 6 (ref 5.0–8.0)

## 2019-12-03 MED ORDER — BCG LIVE 50 MG IS SUSR
3.2400 mL | Freq: Once | INTRAVESICAL | Status: AC
  Administered 2019-12-03: 81 mg via INTRAVESICAL

## 2019-12-03 NOTE — Progress Notes (Signed)
BCG Bladder Instillation  BCG # 6  Due to Bladder Cancer patient is present today for a BCG treatment. Patient was cleaned and prepped in a sterile fashion with betadine. A 14 FR catheter was inserted, urine return was noted 25 ml, urine was yellow, clear in color.  19ml of reconstituted BCG was instilled into the bladder. The catheter was then removed. Patient tolerated well, no complications were noted  Performed by: Nori Riis, PA-C and Kyra Manges, CMA  Follow up/ Additional notes:  For cystoscopy on 03/04/2020 with Dr. Diamantina Providence.

## 2020-01-28 ENCOUNTER — Other Ambulatory Visit: Payer: Self-pay

## 2020-01-28 ENCOUNTER — Ambulatory Visit: Payer: Managed Care, Other (non HMO) | Admitting: Nurse Practitioner

## 2020-01-28 VITALS — BP 111/66 | HR 60 | Resp 16 | Ht 67.0 in | Wt 233.0 lb

## 2020-01-28 DIAGNOSIS — Z008 Encounter for other general examination: Secondary | ICD-10-CM

## 2020-01-28 NOTE — Progress Notes (Signed)
Subjective:     Patient ID: Christina Holloway, female   DOB: 08-27-1960, 59 y.o.   MRN: [id number]  HPI Christina Holloway is a 59 y.o. female who presents to the [facility] for her biometric screening exam.She works for the [employer]. Employee reports she has not had a regular exercise program for the past year due to Covid. 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 She has been receiving treatments for bladder cancer.   Meds: Norvasc, Tenormin,Colchicine, Colace, Hyzaar  Allergies: Penicillin  PMH: Anemia, HTN, Gout, Hypokalemia, bladder tumor, Obesity  SH: Hysterectomy  Social Hx: Occasional ETOH use, denies use of tobacco or drugs  Immunizations: Has had both does of Covid vaccine. Needs Td. Will update today.  Review of Systems  Musculoskeletal: Positive for arthralgias.       Occasional low back pain  All other systems reviewed and are negative.      Objective:   Physical Exam Vitals and nursing note reviewed.  Constitutional:      Appearance: She is well-developed. She is obese.  HENT:     Head: Normocephalic and atraumatic.     Right Ear: Tympanic membrane, ear canal and external ear normal.     Left Ear: Tympanic membrane, ear canal and external ear normal.     Nose: Nose normal.     Mouth/Throat:     Mouth: Mucous membranes are moist.     Pharynx: Oropharynx is clear.  Eyes:     General: Lids are normal. Gaze aligned appropriately.        Right eye: No discharge.        Left eye: No discharge.     Extraocular Movements: Extraocular movements intact.     Conjunctiva/sclera: Conjunctivae normal.     Pupils: Pupils are equal, round, and reactive to light.  Neck:     Thyroid: No thyroid mass or thyroid tenderness.     Vascular: Normal carotid pulses. No carotid bruit or JVD.     Trachea: Trachea normal.  Cardiovascular:     Rate and Rhythm: Normal rate and regular rhythm.     Pulses:           Radial pulses are 2+ on the right side and 2+ on the left side.  Pulmonary:     Effort: No respiratory distress.     Breath sounds: No decreased breath sounds, wheezing, rhonchi or rales.  Abdominal:     Tenderness: There is no abdominal tenderness. There is no right CVA tenderness or left CVA tenderness.  Musculoskeletal:        General: Normal range of motion.     Cervical back: Normal range of motion and neck supple. No spinous process tenderness or muscular tenderness.  Lymphadenopathy:     Cervical: No cervical adenopathy.  Skin:    General: Skin is warm and dry.  Neurological:     General: No focal deficit present.     Mental Status: She is alert and oriented to person, place, and time.     Cranial Nerves: No cranial nerve deficit.     Motor: Motor function is intact.     Coordination: Romberg sign negative.     Gait: Gait normal.     Deep Tendon Reflexes:     Reflex Scores:      Bicep reflexes are 2+ on the right side and 2+ on the left side.  Brachioradialis reflexes are 2+ on the right side and 2+ on the left side.      Patellar reflexes are 2+ on the right side and 2+ on the left side.    Comments: Ambulatory without difficulty, grips are equal, stands on one foot without difficulty.   Psychiatric:        Attention and Perception: Attention normal.        Mood and Affect: Mood normal.        Speech: Speech normal.        Behavior: Behavior normal.        Thought Content: Thought content normal.    BP 111/66 (BP Location: Right Arm, Patient Position: Sitting, Cuff Size: Normal)   Pulse 60   Resp 16   Ht 5\' 7"  (1.702 m)   Wt 233 lb (105.7 kg)   BMI 36.49 kg/m      Assessment:    59 y.o. female here for biometric screening exam. Limited exam is normal.    Plan:    Tetanus updated. Discussed with the employee importance of healthy diet and exercise plan. Employee given opportunity to ask questions. All questions answered and employee voices understanding.

## 2020-01-28 NOTE — Patient Instructions (Addendum)
Health Maintenance, Female Adopting a healthy lifestyle and getting preventive care are important in promoting health and wellness. Ask your health care provider about:  The right schedule for you to have regular tests and exams.  Things you can do on your own to prevent diseases and keep yourself healthy. What should I know about diet, weight, and exercise? Eat a healthy diet   Eat a diet that includes plenty of vegetables, fruits, low-fat dairy products, and lean protein.  Do not eat a lot of foods that are high in solid fats, added sugars, or sodium. Maintain a healthy weight Body mass index (BMI) is used to identify weight problems. It estimates body fat based on height and weight. Your health care provider can help determine your BMI and help you achieve or maintain a healthy weight. Get regular exercise Get regular exercise. This is one of the most important things you can do for your health. Most adults should:  Exercise for at least 150 minutes each week. The exercise should increase your heart rate and make you sweat (moderate-intensity exercise).  Do strengthening exercises at least twice a week. This is in addition to the moderate-intensity exercise.  Spend less time sitting. Even light physical activity can be beneficial. Watch cholesterol and blood lipids Have your blood tested for lipids and cholesterol at 58 years of age, then have this test every 5 years. Have your cholesterol levels checked more often if:  Your lipid or cholesterol levels are high.  You are older than 58 years of age.  You are at high risk for heart disease. What should I know about cancer screening? Depending on your health history and family history, you may need to have cancer screening at various ages. This may include screening for:  Breast cancer.  Cervical cancer.  Colorectal cancer.  Skin cancer.  Lung cancer. What should I know about heart disease, diabetes, and high blood  pressure? Blood pressure and heart disease  High blood pressure causes heart disease and increases the risk of stroke. This is more likely to develop in people who have high blood pressure readings, are of African descent, or are overweight.  Have your blood pressure checked: ? Every 3-5 years if you are 18-39 years of age. ? Every year if you are 40 years old or older. Diabetes Have regular diabetes screenings. This checks your fasting blood sugar level. Have the screening done:  Once every three years after age 40 if you are at a normal weight and have a low risk for diabetes.  More often and at a younger age if you are overweight or have a high risk for diabetes. What should I know about preventing infection? Hepatitis B If you have a higher risk for hepatitis B, you should be screened for this virus. Talk with your health care provider to find out if you are at risk for hepatitis B infection. Hepatitis C Testing is recommended for:  Everyone born from 1945 through 1965.  Anyone with known risk factors for hepatitis C. Sexually transmitted infections (STIs)  Get screened for STIs, including gonorrhea and chlamydia, if: ? You are sexually active and are younger than 58 years of age. ? You are older than 58 years of age and your health care provider tells you that you are at risk for this type of infection. ? Your sexual activity has changed since you were last screened, and you are at increased risk for chlamydia or gonorrhea. Ask your health care provider if   you are at risk.  Ask your health care provider about whether you are at high risk for HIV. Your health care provider may recommend a prescription medicine to help prevent HIV infection. If you choose to take medicine to prevent HIV, you should first get tested for HIV. You should then be tested every 3 months for as long as you are taking the medicine. Pregnancy  If you are about to stop having your period (premenopausal) and  you may become pregnant, seek counseling before you get pregnant.  Take 400 to 800 micrograms (mcg) of folic acid every day if you become pregnant.  Ask for birth control (contraception) if you want to prevent pregnancy. Osteoporosis and menopause Osteoporosis is a disease in which the bones lose minerals and strength with aging. This can result in bone fractures. If you are 65 years old or older, or if you are at risk for osteoporosis and fractures, ask your health care provider if you should:  Be screened for bone loss.  Take a calcium or vitamin D supplement to lower your risk of fractures.  Be given hormone replacement therapy (HRT) to treat symptoms of menopause. Follow these instructions at home: Lifestyle  Do not use any products that contain nicotine or tobacco, such as cigarettes, e-cigarettes, and chewing tobacco. If you need help quitting, ask your health care provider.  Do not use street drugs.  Do not share needles.  Ask your health care provider for help if you need support or information about quitting drugs. Alcohol use  Do not drink alcohol if: ? Your health care provider tells you not to drink. ? You are pregnant, may be pregnant, or are planning to become pregnant.  If you drink alcohol: ? Limit how much you use to 0-1 drink a day. ? Limit intake if you are breastfeeding.  Be aware of how much alcohol is in your drink. In the [place] one drink equals one 12 oz bottle of beer (355 mL), one 5 oz glass of wine (148 mL), or one 1 oz glass of hard liquor (44 mL). General instructions  Schedule regular health, dental, and eye exams.  Stay current with your vaccines.  Tell your health care provider if: ? You often feel depressed. ? You have ever been abused or do not feel safe at home. Summary  Adopting a healthy lifestyle and getting preventive care are important in promoting health and wellness.  Follow your health care provider's instructions about healthy  diet, exercising, and getting tested or screened for diseases.  Follow your health care provider's instructions on monitoring your cholesterol and blood pressure. This information is not intended to replace advice given to you by your health care provider. Make sure you discuss any questions you have with your health care provider. Document Revised: 08/02/2018 Document Reviewed: 08/02/2018 Elsevier Patient Education  2020 [employer] Inc[employer]  

## 2020-01-29 LAB — LIPID PANEL
Chol/HDL Ratio: 5.8 ratio — ABNORMAL HIGH (ref 0.0–4.4)
Cholesterol, Total: 233 mg/dL — ABNORMAL HIGH (ref 100–199)
HDL: 40 mg/dL
LDL Chol Calc (NIH): 159 mg/dL — ABNORMAL HIGH (ref 0–99)
Triglycerides: 186 mg/dL — ABNORMAL HIGH (ref 0–149)
VLDL Cholesterol Cal: 34 mg/dL (ref 5–40)

## 2020-01-29 LAB — GLUCOSE, RANDOM: Glucose: 92 mg/dL (ref 65–99)

## 2020-02-08 ENCOUNTER — Other Ambulatory Visit: Payer: Self-pay | Admitting: Family Medicine

## 2020-02-08 DIAGNOSIS — I10 Essential (primary) hypertension: Secondary | ICD-10-CM

## 2020-02-15 ENCOUNTER — Other Ambulatory Visit: Payer: Self-pay

## 2020-02-15 ENCOUNTER — Encounter: Payer: Self-pay | Admitting: Family Medicine

## 2020-02-15 ENCOUNTER — Ambulatory Visit (INDEPENDENT_AMBULATORY_CARE_PROVIDER_SITE_OTHER): Payer: Managed Care, Other (non HMO) | Admitting: Family Medicine

## 2020-02-15 VITALS — BP 115/71 | HR 60 | Temp 97.7°F | Resp 16 | Ht 68.0 in | Wt 238.4 lb

## 2020-02-15 DIAGNOSIS — R0602 Shortness of breath: Secondary | ICD-10-CM | POA: Insufficient documentation

## 2020-02-15 DIAGNOSIS — K5900 Constipation, unspecified: Secondary | ICD-10-CM

## 2020-02-15 DIAGNOSIS — I10 Essential (primary) hypertension: Secondary | ICD-10-CM

## 2020-02-15 DIAGNOSIS — R0609 Other forms of dyspnea: Secondary | ICD-10-CM

## 2020-02-15 DIAGNOSIS — E7800 Pure hypercholesterolemia, unspecified: Secondary | ICD-10-CM

## 2020-02-15 DIAGNOSIS — R0600 Dyspnea, unspecified: Secondary | ICD-10-CM

## 2020-02-15 DIAGNOSIS — Z1231 Encounter for screening mammogram for malignant neoplasm of breast: Secondary | ICD-10-CM

## 2020-02-15 DIAGNOSIS — F1010 Alcohol abuse, uncomplicated: Secondary | ICD-10-CM | POA: Insufficient documentation

## 2020-02-15 MED ORDER — DOCUSATE SODIUM 100 MG PO CAPS: 100.0000 mg | ORAL_CAPSULE | Freq: Every day | ORAL | 6 refills | Status: DC

## 2020-02-15 MED ORDER — AMLODIPINE BESYLATE 10 MG PO TABS: 10.0000 mg | ORAL_TABLET | Freq: Every day | ORAL | 2 refills | Status: DC

## 2020-02-15 NOTE — Patient Instructions (Signed)
Fat and Cholesterol Restricted Eating Plan Getting too much fat and cholesterol in your diet may cause health problems. Choosing the right foods helps keep your fat and cholesterol at normal levels. This can keep you from getting certain diseases. Your doctor may recommend an eating plan that includes:  Total fat: ______% or less of total calories a day.  Saturated fat: ______% or less of total calories a day.  Cholesterol: less than _________mg a day.  Fiber: ______g a day. What are tips for following this plan? Meal planning  At meals, divide your plate into four equal parts: ? Fill one-half of your plate with vegetables and green salads. ? Fill one-fourth of your plate with whole grains. ? Fill one-fourth of your plate with low-fat (lean) protein foods.  Eat fish that is high in omega-3 fats at least two times a week. This includes mackerel, tuna, sardines, and salmon.  Eat foods that are high in fiber, such as whole grains, beans, apples, broccoli, carrots, peas, and barley. General tips   Work with your doctor to lose weight if you need to.  Avoid: ? Foods with added sugar. ? Fried foods. ? Foods with partially hydrogenated oils.  Limit alcohol intake to no more than 1 drink a day for nonpregnant women and 2 drinks a day for men. One drink equals 12 oz of beer, 5 oz of wine, or 1 oz of hard liquor. Reading food labels  Check food labels for: ? Trans fats. ? Partially hydrogenated oils. ? Saturated fat (g) in each serving. ? Cholesterol (mg) in each serving. ? Fiber (g) in each serving.  Choose foods with healthy fats, such as: ? Monounsaturated fats. ? Polyunsaturated fats. ? Omega-3 fats.  Choose grain products that have whole grains. Look for the word "whole" as the first word in the ingredient list. Cooking  Cook foods using low-fat methods. These include baking, boiling, grilling, and broiling.  Eat more home-cooked foods. Eat at restaurants and buffets  less often.  Avoid cooking using saturated fats, such as butter, cream, palm oil, palm kernel oil, and coconut oil. Recommended foods  Fruits  All fresh, canned (in natural juice), or frozen fruits. Vegetables  Fresh or frozen vegetables (raw, steamed, roasted, or grilled). Green salads. Grains  Whole grains, such as whole wheat or whole grain breads, crackers, cereals, and pasta. Unsweetened oatmeal, bulgur, barley, quinoa, or brown rice. Corn or whole wheat flour tortillas. Meats and other protein foods  Ground beef (85% or leaner), grass-fed beef, or beef trimmed of fat. Skinless chicken or turkey. Ground chicken or turkey. Pork trimmed of fat. All fish and seafood. Egg whites. Dried beans, peas, or lentils. Unsalted nuts or seeds. Unsalted canned beans. Nut butters without added sugar or oil. Dairy  Low-fat or nonfat dairy products, such as skim or 1% milk, 2% or reduced-fat cheeses, low-fat and fat-free ricotta or cottage cheese, or plain low-fat and nonfat yogurt. Fats and oils  Tub margarine without trans fats. Light or reduced-fat mayonnaise and salad dressings. Avocado. Olive, canola, sesame, or safflower oils. The items listed above may not be a complete list of foods and beverages you can eat. Contact a dietitian for more information. Foods to avoid Fruits  Canned fruit in heavy syrup. Fruit in cream or butter sauce. Fried fruit. Vegetables  Vegetables cooked in cheese, cream, or butter sauce. Fried vegetables. Grains  White bread. White pasta. White rice. Cornbread. Bagels, pastries, and croissants. Crackers and snack foods that contain trans fat   and hydrogenated oils. Meats and other protein foods  Fatty cuts of meat. Ribs, chicken wings, bacon, sausage, bologna, salami, chitterlings, fatback, hot dogs, bratwurst, and packaged lunch meats. Liver and organ meats. Whole eggs and egg yolks. Chicken and turkey with skin. Fried meat. Dairy  Whole or 2% milk, cream,  half-and-half, and cream cheese. Whole milk cheeses. Whole-fat or sweetened yogurt. Full-fat cheeses. Nondairy creamers and whipped toppings. Processed cheese, cheese spreads, and cheese curds. Beverages  Alcohol. Sugar-sweetened drinks such as sodas, lemonade, and fruit drinks. Fats and oils  Butter, stick margarine, lard, shortening, ghee, or bacon fat. Coconut, palm kernel, and palm oils. Sweets and desserts  Corn syrup, sugars, honey, and molasses. Candy. Jam and jelly. Syrup. Sweetened cereals. Cookies, pies, cakes, donuts, muffins, and ice cream. The items listed above may not be a complete list of foods and beverages you should avoid. Contact a dietitian for more information. Summary  Choosing the right foods helps keep your fat and cholesterol at normal levels. This can keep you from getting certain diseases.  At meals, fill one-half of your plate with vegetables and green salads.  Eat high-fiber foods, like whole grains, beans, apples, carrots, peas, and barley.  Limit added sugar, saturated fats, alcohol, and fried foods. This information is not intended to replace advice given to you by your health care provider. Make sure you discuss any questions you have with your health care provider. Document Revised: 04/12/2018 Document Reviewed: 04/26/2017 Elsevier Patient Education  2020 Elsevier Inc.  

## 2020-02-15 NOTE — Assessment & Plan Note (Addendum)
New problem Possibly due to deconditioning as ongoing for several years Discussed possibility of DOE being anginal equivalent Patient to work on healthy lifestyle changes If symptoms persist will refer to Cardiology - patient prefers to defer this

## 2020-02-15 NOTE — Assessment & Plan Note (Addendum)
New problem, but ongoing for some time Counseled on importance of cutting back on alcohol Discussed no more than 2 drinks per day

## 2020-02-15 NOTE — Assessment & Plan Note (Signed)
Reviewed last lipid panel Not currently on a statin Recheck FLP and CMP Discussed diet and exercise  

## 2020-02-15 NOTE — Assessment & Plan Note (Addendum)
Stable Advised on healthy diet and adequate fiber daily Patient to continue using Miralax and stool softeners as needed

## 2020-02-15 NOTE — Progress Notes (Signed)
Established patient visit   Patient: Christina Holloway   DOB: October 13, 1960   59 y.o. Female  MRN: [id number] Visit Date: 02/15/2020  I,Sulibeya S Dimas,acting as a scribe for Lavon Paganini, MD.,have documented all relevant documentation on the behalf of Lavon Paganini, MD,as directed by  Lavon Paganini, MD while in the presence of Lavon Paganini, MD.  Today's healthcare provider: Lavon Paganini, MD   Chief Complaint  Patient presents with  . Hypertension  . Hyperlipidemia   Subjective    HPI Hypertension, follow-up  BP Readings from Last 3 Encounters:  02/15/20 115/71  01/28/20 111/66  11/19/19 133/87   Wt Readings from Last 3 Encounters:  02/15/20 238 lb 6.4 oz (108.1 kg)  01/28/20 233 lb (105.7 kg)  10/29/19 240 lb (108.9 kg)     She was last seen for hypertension 4 months ago.  BP at that visit was 143/86. Management since that visit includes increase amlodipine to 10 mg daily.  She reports excellent compliance with treatment. She is not having side effects.  She is following a Regular diet. She is not exercising. She does not smoke.  Use of agents associated with hypertension: none.   Outside blood pressures are not being checked. Symptoms: No chest pain No chest pressure  No palpitations No syncope  No dyspnea No orthopnea  No paroxysmal nocturnal dyspnea Yes lower extremity edema   Pertinent labs: Lab Results  Component Value Date   CHOL 233 (H) 01/28/2020   HDL 40 01/28/2020   LDLCALC 159 (H) 01/28/2020   TRIG 186 (H) 01/28/2020   CHOLHDL 5.8 (H) 01/28/2020   Lab Results  Component Value Date   NA 139 08/10/2019   K 3.9 09/12/2019   CREATININE 0.80 08/10/2019   GFRNONAA >60 08/07/2019   GFRAA >60 08/07/2019   GLUCOSE 92 01/28/2020     The 10-year ASCVD risk score Mikey Bussing DC Jr., et al., 2013) is: 5.9%   --------------------------------------------------------------------------------------------------- Lipid/Cholesterol,  Follow-up  Last lipid panel Other pertinent labs  Lab Results  Component Value Date   CHOL 233 (H) 01/28/2020   HDL 40 01/28/2020   LDLCALC 159 (H) 01/28/2020   TRIG 186 (H) 01/28/2020   CHOLHDL 5.8 (H) 01/28/2020   Lab Results  Component Value Date   ALT 32 08/07/2019   AST 25 08/07/2019   PLT 316 08/07/2019   TSH 1.230 02/07/2017     She was last seen for this 4 months ago.  Management since that visit includes checking labs.  She reports no medication.  Symptoms: No chest pain No chest pressure/discomfort  No dyspnea Yes lower extremity edema  No numbness or tingling of extremity No orthopnea  No palpitations No paroxysmal nocturnal dyspnea  No speech difficulty No syncope   Current diet: in general, a "healthy" diet   Current exercise: none  The 10-year ASCVD risk score Mikey Bussing DC Jr., et al., 2013) is: 5.9%  --------------------------------------------------------------------------------------------------- Audit-C Alcohol Use Screening   Alcohol Use Disorder Test (AUDIT) 07/10/2019 02/15/2020  1. How often do you have a drink containing alcohol? 3 4  2. How many drinks containing alcohol do you have on a typical day when you are drinking? 3 1  3. How often do you have six or more drinks on one occasion? 2 3  AUDIT-C Score 8 8  4. How often during the last year have you found that you were not able to stop drinking once you had started? 0 1  5. How often  during the last year have you failed to do what was normally expected from you because of drinking? 0 0  6. How often during the last year have you needed a first drink in the morning to get yourself going after a heavy drinking session? 0 0  7. How often during the last year have you had a feeling of guilt of remorse after drinking? 0 0  8. How often during the last year have you been unable to remember what happened the night before because you had been drinking? 1 0  9. Have you or someone else been injured as a  result of your drinking? 0 0  10. Has a relative or friend or a doctor or another health worker been concerned about your drinking or suggested you cut down? 2 2  Alcohol Use Disorder Identification Test Final Score (AUDIT) 11 11    A score of 3 or more in women, and 4 or more in men indicates increased risk for alcohol abuse, EXCEPT if all of the points are from question 1   Patient Active Problem List   Diagnosis Date Noted  . Shortness of breath 02/15/2020  . Alcohol abuse 02/15/2020  . Obesity 10/15/2019  . Constipation 10/15/2019  . 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 Mixed hyperlipidemia 01/11/2019  . Right knee pain 12/08/2015  . History of colonic polyps   . Absolute anemia 05/02/2015  . Abnormal LFTs 05/02/2015  . Gout 05/02/2015  . Calcium blood increased 05/02/2015  . Decreased potassium in the blood 05/02/2015  . Malaise and fatigue 05/02/2015  . Hypercholesterolemia without hypertriglyceridemia 04/29/2009  . Anemia, iron deficiency 08/24/2003  . Essential (primary) hypertension 08/24/2003   Social History   Tobacco Use  . Smoking status: Never Smoker  . Smokeless tobacco: Never Used  Substance Use Topics  . Alcohol use: Yes    Alcohol/week: 36.0 standard drinks    Types: 36 Cans of beer per week  . Drug use: No       Medications: Outpatient Medications Prior to Visit  Medication Sig  . atenolol (TENORMIN) 25 MG tablet Take 1 tablet (25 mg total) by mouth every morning.  . colchicine 0.6 MG tablet Take 1.2mg  PO x1 at first sign of gout flare, then 0.6mg  1 hour later. Continue 0.6mg  BID until 48 hours after resolution of flare. (Patient taking differently: Take 0.6 mg by mouth daily as needed (gout). )  . losartan-hydrochlorothiazide (HYZAAR) 100-12.5 MG tablet Take 1 tablet by mouth every morning.  . [DISCONTINUED] amLODipine (NORVASC) 10 MG tablet TAKE 1 TABLET BY MOUTH DAILY  . [DISCONTINUED] docusate sodium  (COLACE) 100 MG capsule Take 100 mg by mouth daily.   No facility-administered medications prior to visit.    Review of Systems  Constitutional: Positive for fatigue. Negative for appetite change and unexpected weight change.  Respiratory: Positive for shortness of breath. Negative for cough, chest tightness and wheezing.   Cardiovascular: Positive for leg swelling. Negative for chest pain and palpitations.  Musculoskeletal: Positive for myalgias.     Objective    BP 115/71 (BP Location: Left Arm, Patient Position: Sitting, Cuff Size: Normal)   Pulse 60   Temp 97.7 F (36.5 C) (Temporal)   Resp 16   Ht 5\' 8"  (1.727 m)   Wt 238 lb 6.4 oz (108.1 kg)   SpO2 97%   BMI 36.25 kg/m  BP Readings from Last 3 Encounters:  02/15/20 115/71  01/28/20 111/66  11/19/19 133/87   Wt Readings from Last 3 Encounters:  02/15/20 238 lb 6.4 oz (108.1 kg)  01/28/20 233 lb (105.7 kg)  10/29/19 240 lb (108.9 kg)      Physical Exam Vitals reviewed.  Constitutional:      General: She is not in acute distress.    Appearance: Normal appearance. She is well-developed. She is not diaphoretic.  HENT:     Head: Normocephalic and atraumatic.  Eyes:     General: No scleral icterus.    Conjunctiva/sclera: Conjunctivae normal.  Neck:     Thyroid: No thyromegaly.  Cardiovascular:     Rate and Rhythm: Normal rate and regular rhythm.     Pulses: Normal pulses.     Heart sounds: Normal heart sounds. No murmur heard.   Pulmonary:     Effort: Pulmonary effort is normal. No respiratory distress.     Breath sounds: Normal breath sounds. No wheezing, rhonchi or rales.  Musculoskeletal:     Cervical back: Neck supple.     Right lower leg: No edema.     Left lower leg: No edema.  Lymphadenopathy:     Cervical: No cervical adenopathy.  Skin:    General: Skin is warm and dry.     Findings: No rash.  Neurological:     Mental Status: She is alert and oriented to person, place, and time. Mental status  is at baseline.  Psychiatric:        Mood and Affect: Mood normal.        Behavior: Behavior normal.      No results found for any visits on 02/15/20.  Assessment & Plan     Problem List Items Addressed This Visit      Cardiovascular and Mediastinum   Essential (primary) hypertension - Primary    Well controlled Continue current medications Recheck metabolic panel F/u in 3-6 months       Relevant Medications   amLODipine (NORVASC) 10 MG tablet   Other Relevant Orders   Comprehensive metabolic panel     Other   Hypercholesterolemia without hypertriglyceridemia    Reviewed last lipid panel Not currently on a statin Recheck FLP and CMP Discussed diet and exercise       Relevant Medications   amLODipine (NORVASC) 10 MG tablet   Constipation    Stable Advised on healthy diet and adequate fiber daily Patient to continue using Miralax and stool softeners as needed      Shortness of breath    New problem Possibly due to deconditioning as ongoing for several years Discussed possibility of DOE being anginal equivalent Patient to work on healthy lifestyle changes If symptoms persist will refer to Cardiology - patient prefers to defer this      Alcohol abuse    New problem, but ongoing for some time Counseled on importance of cutting back on alcohol Discussed no more than 2 drinks per day       Other Visit Diagnoses    Encounter for screening mammogram for malignant neoplasm of breast       Relevant Orders   MM 3D SCREEN BREAST BILATERAL       Return in about 3 months (around 05/17/2020), or 3-6 MONTHS, for chronic disease f/u.      I, Lavon Paganini, MD, have reviewed all documentation for this visit. The documentation on 02/15/20 for the exam, diagnosis, procedures, and orders are all accurate and complete.   Adamarie Izzo, Dionne Bucy, MD, MPH [facility]

## 2020-02-15 NOTE — Assessment & Plan Note (Signed)
Well controlled Continue current medications Recheck metabolic panel F/u in 3-6 months  

## 2020-02-16 LAB — COMPREHENSIVE METABOLIC PANEL
ALT: 32 IU/L (ref 0–32)
AST: 23 IU/L (ref 0–40)
Albumin/Globulin Ratio: 1.8 (ref 1.2–2.2)
Albumin: 4.4 g/dL (ref 3.8–4.9)
Alkaline Phosphatase: 59 IU/L (ref 48–121)
BUN/Creatinine Ratio: 16 (ref 9–23)
BUN: 15 mg/dL (ref 6–24)
Bilirubin Total: 0.2 mg/dL (ref 0.0–1.2)
CO2: 24 mmol/L (ref 20–29)
Calcium: 10.4 mg/dL — ABNORMAL HIGH (ref 8.7–10.2)
Chloride: 103 mmol/L (ref 96–106)
Creatinine, Ser: 0.94 mg/dL (ref 0.57–1.00)
GFR calc Af Amer: 78 mL/min/{1.73_m2} (ref >59)
GFR calc non Af Amer: 68 mL/min/{1.73_m2} (ref >59)
Globulin, Total: 2.5 g/dL (ref 1.5–4.5)
Glucose: 97 mg/dL (ref 65–99)
Potassium: 4.1 mmol/L (ref 3.5–5.2)
Sodium: 140 mmol/L (ref 134–144)
Total Protein: 6.9 g/dL (ref 6.0–8.5)

## 2020-03-03 ENCOUNTER — Other Ambulatory Visit: Payer: Self-pay | Admitting: *Deleted

## 2020-03-03 DIAGNOSIS — D494 Neoplasm of unspecified behavior of bladder: Secondary | ICD-10-CM

## 2020-03-04 ENCOUNTER — Encounter: Payer: Self-pay | Admitting: Urology

## 2020-03-04 ENCOUNTER — Other Ambulatory Visit
Admission: RE | Admit: 2020-03-04 | Discharge: 2020-03-04 | Disposition: A | Discharge status: Home / Self Care | Payer: Managed Care, Other (non HMO) | Attending: Urology | Admitting: Urology

## 2020-03-04 ENCOUNTER — Other Ambulatory Visit: Payer: Self-pay

## 2020-03-04 ENCOUNTER — Ambulatory Visit (INDEPENDENT_AMBULATORY_CARE_PROVIDER_SITE_OTHER): Payer: Managed Care, Other (non HMO) | Admitting: Urology

## 2020-03-04 VITALS — BP 115/75 | HR 55 | Ht 67.0 in | Wt 231.0 lb

## 2020-03-04 DIAGNOSIS — C674 Malignant neoplasm of posterior wall of bladder: Secondary | ICD-10-CM | POA: Diagnosis not present

## 2020-03-04 DIAGNOSIS — D494 Neoplasm of unspecified behavior of bladder: Secondary | ICD-10-CM | POA: Diagnosis not present

## 2020-03-04 LAB — URINALYSIS, COMPLETE (UACMP) WITH MICROSCOPIC
Bilirubin Urine: NEGATIVE
Glucose, UA: NEGATIVE mg/dL
Leukocytes,Ua: NEGATIVE
Nitrite: NEGATIVE
Protein, ur: NEGATIVE mg/dL
Specific Gravity, Urine: 1.025 (ref 1.005–1.030)
pH: 6 (ref 5.0–8.0)

## 2020-03-04 NOTE — Progress Notes (Signed)
Bladder cancer surveillance note  UROLOGIC HISTORY Christina Holloway is a 59 y.o. female who originally presented in December 2020 with gross hematuria and was found of a 3 cm bladder tumor at the right posterior wall with no evidence of metastatic disease.  She underwent TURBT on 08/10/2019 with pathology showing high-grade T1 with no muscle involvement, and repeat TURBT on 09/14/2019 showed no evidence of malignancy.  She completed induction BCG in April 2021.  She is a never smoker.  Initial Diagnosis of Bladder  Year: 07/2019 Pathology: High-grade T1, muscle present and not involved  Recurrent Bladder Cancer Diagnosis None  Treatments for Bladder Cancer 08/10/2019: Initial TURBT 3 cm, HG T1 09/14/2019: 2nd look TURBT, pathology negative 3-11/2019: Induction BCG x6  AUA Risk Category High  Cystoscopy Procedure Note: After informed consent and discussion of the procedure and its risks, Christina Holloway was positioned and prepped in the standard fashion. Cystoscopy was performed with the a flexible cystoscope. The urethra, bladder neck and entire bladder was visualized in a standard fashion, and mucosa was normal throughout.  There is a well-healed scar at the right posterior bladder wall. The ureteral orifices were visualized in their normal location and orientation.  No abnormalities on retroflexion.  Mucosa grossly normal throughout with no erythema or suspicious lesions.  Continue with maintenance BCG at 3, 6, 12, 18, 24, 30, and 36 months RTC for cysto in 3-4 months  Nickolas Madrid, MD 03/04/2020

## 2020-03-13 ENCOUNTER — Telehealth: Payer: Self-pay

## 2020-03-13 NOTE — Telephone Encounter (Signed)
Attempted to contact patient to schedule maintenance BCG, no answer could not leave [email or web address]. [email or web address] notification sent

## 2020-03-23 NOTE — Progress Notes (Signed)
BCG Bladder Instillation  BCG # 1/3  Due to Bladder Cancer patient is present today for a BCG treatment. Patient was cleaned and prepped in a sterile fashion with betadine. A 14 FR catheter was inserted, urine return was noted 20 ml, urine was yellow in color.  54ml of reconstituted BCG was instilled into the bladder. The catheter was then removed. Patient tolerated well, no complications were noted  Performed by: Zara Council, PA-C and DeShannon Tamala Julian, CMA  Follow up/ Additional notes: One week for BCG 2/3

## 2020-03-24 ENCOUNTER — Other Ambulatory Visit: Payer: Self-pay

## 2020-03-24 ENCOUNTER — Other Ambulatory Visit
Admission: RE | Admit: 2020-03-24 | Discharge: 2020-03-24 | Disposition: A | Discharge status: Home / Self Care | Payer: Managed Care, Other (non HMO) | Attending: Urology | Admitting: Urology

## 2020-03-24 ENCOUNTER — Ambulatory Visit (INDEPENDENT_AMBULATORY_CARE_PROVIDER_SITE_OTHER): Payer: Managed Care, Other (non HMO) | Admitting: Urology

## 2020-03-24 ENCOUNTER — Encounter: Payer: Self-pay | Admitting: Urology

## 2020-03-24 ENCOUNTER — Other Ambulatory Visit: Payer: Self-pay | Admitting: *Deleted

## 2020-03-24 VITALS — BP 131/83 | HR 52 | Ht 67.0 in | Wt 234.0 lb

## 2020-03-24 DIAGNOSIS — C674 Malignant neoplasm of posterior wall of bladder: Secondary | ICD-10-CM | POA: Insufficient documentation

## 2020-03-24 LAB — URINALYSIS, COMPLETE (UACMP) WITH MICROSCOPIC
Bilirubin Urine: NEGATIVE
Glucose, UA: NEGATIVE mg/dL
Ketones, ur: NEGATIVE mg/dL
Leukocytes,Ua: NEGATIVE
Nitrite: NEGATIVE
Protein, ur: 30 mg/dL — AB
Specific Gravity, Urine: 1.025 (ref 1.005–1.030)
pH: 6 (ref 5.0–8.0)

## 2020-03-24 MED ORDER — BCG LIVE 50 MG IS SUSR
3.2400 mL | Freq: Once | INTRAVESICAL | Status: AC
  Administered 2020-03-24: 81 mg via INTRAVESICAL

## 2020-03-30 NOTE — Progress Notes (Signed)
BCG Bladder Instillation  BCG # 2/3  Due to Bladder Cancer patient is present today for a BCG treatment. Patient was cleaned and prepped in a sterile fashion with betadine. A 14 FR catheter was inserted, urine return was noted 30 ml, urine was yellow in color.  74ml of reconstituted BCG was instilled into the bladder. The catheter was then removed. Patient tolerated well, no complications were noted  Performed by: Zara Council, PA-C and DeShannon Tamala Julian, CMA   Follow up/ Additional notes: One week for 3/3

## 2020-03-31 ENCOUNTER — Other Ambulatory Visit: Payer: Self-pay

## 2020-03-31 ENCOUNTER — Encounter: Payer: Self-pay | Admitting: Urology

## 2020-03-31 ENCOUNTER — Other Ambulatory Visit: Payer: Self-pay | Admitting: *Deleted

## 2020-03-31 ENCOUNTER — Ambulatory Visit (INDEPENDENT_AMBULATORY_CARE_PROVIDER_SITE_OTHER): Payer: Managed Care, Other (non HMO) | Admitting: Urology

## 2020-03-31 ENCOUNTER — Other Ambulatory Visit
Admission: RE | Admit: 2020-03-31 | Discharge: 2020-03-31 | Disposition: A | Discharge status: Home / Self Care | Payer: Managed Care, Other (non HMO) | Attending: Urology | Admitting: Urology

## 2020-03-31 VITALS — BP 120/67 | HR 84 | Ht 67.0 in | Wt 232.0 lb

## 2020-03-31 DIAGNOSIS — C674 Malignant neoplasm of posterior wall of bladder: Secondary | ICD-10-CM

## 2020-03-31 LAB — URINALYSIS, COMPLETE (UACMP) WITH MICROSCOPIC
Bilirubin Urine: NEGATIVE
Glucose, UA: NEGATIVE mg/dL
Ketones, ur: NEGATIVE mg/dL
Leukocytes,Ua: NEGATIVE
Nitrite: NEGATIVE
Protein, ur: NEGATIVE mg/dL
Specific Gravity, Urine: 1.025 (ref 1.005–1.030)
pH: 5.5 (ref 5.0–8.0)

## 2020-03-31 MED ORDER — BCG LIVE 50 MG IS SUSR
3.2400 mL | Freq: Once | INTRAVESICAL | Status: AC
  Administered 2020-03-31: 81 mg via INTRAVESICAL

## 2020-04-06 NOTE — Progress Notes (Addendum)
BCG Bladder Instillation  BCG # 3/3  Due to Bladder Cancer patient is present today for a BCG treatment. Patient was cleaned and prepped in a sterile fashion with betadine. A 14 FR catheter was inserted, urine return was noted 10 ml, urine was yellow in color.  54ml of reconstituted BCG was instilled into the bladder. The catheter was then removed. Patient tolerated well, no complications were noted  Preformed by: Zara Council, PA-C and Carry Louretta Shorten, CMA  Follow up/ Additional notes: Continue maintenance BCG q3 months. RTC for cystoscopy on 07/08/2020.   Fransico Him, am acting as a Education administrator for [employer],  I have reviewed the above documentation for accuracy and completeness, and I agree with the above.    Zara Council, PA-C

## 2020-04-07 ENCOUNTER — Other Ambulatory Visit: Payer: Self-pay

## 2020-04-07 ENCOUNTER — Encounter: Payer: Self-pay | Admitting: Urology

## 2020-04-07 ENCOUNTER — Ambulatory Visit (INDEPENDENT_AMBULATORY_CARE_PROVIDER_SITE_OTHER): Payer: Managed Care, Other (non HMO) | Admitting: Urology

## 2020-04-07 DIAGNOSIS — C674 Malignant neoplasm of posterior wall of bladder: Secondary | ICD-10-CM | POA: Diagnosis not present

## 2020-04-07 LAB — MICROSCOPIC EXAMINATION

## 2020-04-07 LAB — URINALYSIS, COMPLETE
Bilirubin, UA: NEGATIVE
Glucose, UA: NEGATIVE
Ketones, UA: NEGATIVE
Leukocytes,UA: NEGATIVE
Nitrite, UA: NEGATIVE
Specific Gravity, UA: 1.025 (ref 1.005–1.030)
Urobilinogen, Ur: 0.2 mg/dL (ref 0.2–1.0)
pH, UA: 5.5 (ref 5.0–7.5)

## 2020-04-07 MED ORDER — BCG LIVE 50 MG IS SUSR
3.2400 mL | Freq: Once | INTRAVESICAL | Status: AC
  Administered 2020-04-07: 81 mg via INTRAVESICAL

## 2020-04-14 ENCOUNTER — Ambulatory Visit
Admission: RE | Admit: 2020-04-14 | Discharge: 2020-04-14 | Disposition: A | Discharge status: Home / Self Care | Payer: Managed Care, Other (non HMO) | Source: Ambulatory Visit | Attending: Family Medicine | Admitting: Family Medicine

## 2020-04-14 ENCOUNTER — Other Ambulatory Visit: Payer: Self-pay

## 2020-04-14 DIAGNOSIS — Z1231 Encounter for screening mammogram for malignant neoplasm of breast: Secondary | ICD-10-CM | POA: Insufficient documentation

## 2020-04-15 ENCOUNTER — Telehealth: Payer: Self-pay

## 2020-04-15 NOTE — Telephone Encounter (Signed)
Patient advised as below.  

## 2020-04-15 NOTE — Telephone Encounter (Signed)
-----   Message from Virginia Crews, MD sent at 04/14/2020  1:36 PM EDT ----- Normal mammogram. Repeat in 1 yr

## 2020-04-21 ENCOUNTER — Other Ambulatory Visit: Payer: Self-pay | Admitting: Family Medicine

## 2020-04-21 DIAGNOSIS — I10 Essential (primary) hypertension: Secondary | ICD-10-CM

## 2020-04-21 NOTE — Telephone Encounter (Signed)
Requested Prescriptions  Pending Prescriptions Disp Refills   atenolol (TENORMIN) 25 MG tablet [Pharmacy Med Name: ATENOLOL 25 MG TAB] 90 tablet 1    Sig: TAKE 1 TABLET BY MOUTH DAILY     Cardiovascular:  Beta Blockers Passed - 04/21/2020  2:36 PM      Passed - Last BP in normal range    BP Readings from Last 1 Encounters:  03/31/20 120/67         Passed - Last Heart Rate in normal range    Pulse Readings from Last 1 Encounters:  03/31/20 84         Passed - Valid encounter within last 6 months    Recent Outpatient Visits          2 months ago Essential (primary) hypertension   [facility], Dionne Bucy, MD   6 months ago Essential (primary) hypertension   [facility], Dionne Bucy, [place]   7 months ago Essential hypertension   [facility] Fenton Malling M, PA-C   9 months ago Hematuria, unspecified type   [facility], Vickki Muff, [place]   1 year ago Essential (primary) hypertension   [facility], Dionne Bucy, MD      Future Appointments            In 1 month Bacigalupo, Dionne Bucy, MD [facility]            losartan-hydrochlorothiazide (HYZAAR) 100-12.5 MG tablet [Pharmacy Med Name: LOSARTAN POTASSIUM-HCTZ 100-12.5 MG] 90 tablet 1    Sig: TAKE 1 TABLET BY MOUTH DAILY     Cardiovascular: ARB + Diuretic Combos Failed - 04/21/2020  2:36 PM      Failed - Ca in normal range and within 180 days    Calcium  Date Value Ref Range Status  02/15/2020 10.4 (H) 8.7 - 10.2 mg/dL Final   Calcium, Total  Date Value Ref Range Status  11/27/2014 9.5 mg/dL Final    Comment:    8.9-10.3 NOTE: New Reference Range  10/29/14    Calcium, Ion  Date Value Ref Range Status  08/10/2019 1.24 1.15 - 1.40 mmol/L Final         Passed - K in normal range and within 180 days    Potassium  Date Value Ref Range Status  02/15/2020 4.1 3.5 - 5.2 mmol/L Final   11/27/2014 3.0 (L) mmol/L Final    Comment:    3.5-5.1 NOTE: New Reference Range  10/29/14          Passed - Na in normal range and within 180 days    Sodium  Date Value Ref Range Status  02/15/2020 140 134 - 144 mmol/L Final  11/27/2014 142 mmol/L Final    Comment:    135-145 NOTE: New Reference Range  10/29/14          Passed - Cr in normal range and within 180 days    Creatinine  Date Value Ref Range Status  11/27/2014 0.75 mg/dL Final    Comment:    0.44-1.00 NOTE: New Reference Range  10/29/14    Creatinine, Ser  Date Value Ref Range Status  02/15/2020 0.94 0.57 - 1.00 mg/dL Final         Passed - Patient is not pregnant      Passed - Last BP in normal range    BP Readings from Last 1 Encounters:  03/31/20 120/67  Passed - Valid encounter within last 6 months    Recent Outpatient Visits          2 months ago Essential (primary) hypertension   [facility], Dionne Bucy, [place]   6 months ago Essential (primary) hypertension   [facility], Dionne Bucy, MD   7 months ago Essential hypertension   [facility] Fenton Malling M, [place]   9 months ago Hematuria, unspecified type   [facility], Vickki Muff, [place]   1 year ago Essential (primary) hypertension   [facility], Dionne Bucy, MD      Future Appointments            In 1 month Bacigalupo, Dionne Bucy, MD [facility], [place]

## 2020-05-20 ENCOUNTER — Telehealth: Payer: Self-pay

## 2020-05-20 ENCOUNTER — Ambulatory Visit: Payer: Self-pay

## 2020-05-20 NOTE — Telephone Encounter (Signed)
Covid Questions

## 2020-05-20 NOTE — Telephone Encounter (Signed)
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 KW

## 2020-05-20 NOTE — Telephone Encounter (Signed)
Copied from [place] #[id number]. Topic: General - Inquiry >> May 20, 2020  3:14 PM Lennox Solders wrote: Reason for CRM: Pt is calling and would like dr b know she tested positive for covid 19. Pt took rapid covid test today. Pt does not have any taste nor smell also has stuffy nose. Pt would like to know how long should she be out of work. Pt would also needs a work note. Pt has also been dizzy. The call was transferred to NT

## 2020-05-20 NOTE — Telephone Encounter (Signed)
Pt. Tested positive today. Had both COVID 19 vaccines.Offered virtual visit. Pt. Declines."I don't feel too bad today.I need to know how Dr. B thinks I should stay out of work and I need a note for work." Please advise pt.   Answer Assessment - Initial Assessment Questions 1. COVID-19 DIAGNOSIS: "Who made your Coronavirus (COVID-19) diagnosis?" "Was it confirmed by a positive lab test?" If not diagnosed by a HCP, ask "Are there lots of cases (community spread) where you live?" (See public health department website, if unsure)     Work 2. COVID-19 EXPOSURE: "Was there any known exposure to COVID before the symptoms began?" CDC Definition of close contact: within 6 feet (2 meters) for a total of 15 minutes or more over a 24-hour period.      No 3. ONSET: "When did the COVID-19 symptoms start?"       1 week ago 4. WORST SYMPTOM: "What is your worst symptom?" (e.g., cough, fever, shortness of breath, muscle aches)     Had congestion 5. COUGH: "Do you have a cough?" If Yes, ask: "How bad is the cough?"       No 6. FEVER: "Do you have a fever?" If Yes, ask: "What is your temperature, how was it measured, and when did it start?"     No 7. RESPIRATORY STATUS: "Describe your breathing?" (e.g., shortness of breath, wheezing, unable to speak)      No 8. BETTER-SAME-WORSE: "Are you getting better, staying the same or getting worse compared to yesterday?"  If getting worse, ask, "In what way?"     Same 9. HIGH RISK DISEASE: "Do you have any chronic medical problems?" (e.g., asthma, heart or lung disease, weak immune system, obesity, etc.)     No 10. PREGNANCY: "Is there any chance you are pregnant?" "When was your last menstrual period?"       No 11. OTHER SYMPTOMS: "Do you have any other symptoms?"  (e.g., chills, fatigue, headache, loss of smell or taste, muscle pain, sore throat; new loss of smell or taste especially support the diagnosis of COVID-19)       Loss of taste and smell  Protocols used:  CORONAVIRUS (COVID-19) DIAGNOSED OR SUSPECTED-A-AH

## 2020-05-21 NOTE — Telephone Encounter (Signed)
Spoke with patient to reschedule her appt.  She is still waiting for a call back to discuss her positive Covid result.  Thanks, [employer]

## 2020-05-21 NOTE — Telephone Encounter (Signed)
See other phone note

## 2020-05-21 NOTE — Telephone Encounter (Signed)
Would recommend virtual visit.  Would also qualify for mAb infusion if she would like [place] to place referral.  I could fit in a virtual visit sometime tomorrow

## 2020-05-22 ENCOUNTER — Encounter: Payer: Self-pay | Admitting: Family Medicine

## 2020-05-22 ENCOUNTER — Other Ambulatory Visit: Payer: Self-pay

## 2020-05-22 ENCOUNTER — Other Ambulatory Visit: Payer: Self-pay | Admitting: Nurse Practitioner

## 2020-05-22 ENCOUNTER — Telehealth (INDEPENDENT_AMBULATORY_CARE_PROVIDER_SITE_OTHER): Payer: Managed Care, Other (non HMO) | Admitting: Family Medicine

## 2020-05-22 ENCOUNTER — Ambulatory Visit (HOSPITAL_COMMUNITY)
Admission: RE | Admit: 2020-05-22 | Discharge: 2020-05-22 | Disposition: A | Discharge status: Home / Self Care | Payer: Managed Care, Other (non HMO) | Source: Ambulatory Visit | Attending: Pulmonary Disease | Admitting: Pulmonary Disease

## 2020-05-22 ENCOUNTER — Telehealth: Payer: Self-pay

## 2020-05-22 DIAGNOSIS — U071 COVID-19: Secondary | ICD-10-CM | POA: Insufficient documentation

## 2020-05-22 MED ORDER — EPINEPHRINE 0.3 MG/0.3ML IJ SOAJ: 0.3000 mg | Freq: Once | INTRAMUSCULAR | Status: DC | PRN

## 2020-05-22 MED ORDER — ALBUTEROL SULFATE HFA 108 (90 BASE) MCG/ACT IN AERS: 2.0000 | INHALATION_SPRAY | Freq: Once | RESPIRATORY_TRACT | Status: DC | PRN

## 2020-05-22 MED ORDER — SODIUM CHLORIDE 0.9 % IV SOLN
1200.0000 mg | Freq: Once | INTRAVENOUS | Status: AC
  Administered 2020-05-22: 1200 mg via INTRAVENOUS

## 2020-05-22 MED ORDER — DIPHENHYDRAMINE HCL 50 MG/ML IJ SOLN: 50.0000 mg | Freq: Once | INTRAMUSCULAR | Status: DC | PRN

## 2020-05-22 MED ORDER — FAMOTIDINE IN NACL 20-0.9 MG/50ML-% IV SOLN: 20.0000 mg | Freq: Once | INTRAVENOUS | Status: DC | PRN

## 2020-05-22 MED ORDER — SODIUM CHLORIDE 0.9 % IV SOLN: INTRAVENOUS | Status: DC | PRN

## 2020-05-22 MED ORDER — METHYLPREDNISOLONE SODIUM SUCC 125 MG IJ SOLR: 125.0000 mg | Freq: Once | INTRAMUSCULAR | Status: DC | PRN

## 2020-05-22 NOTE — Discharge Instructions (Signed)

## 2020-05-22 NOTE — Telephone Encounter (Signed)
Apt today at 1pm (05/22/2020)  Thanks,   -Mickel Baas

## 2020-05-22 NOTE — Telephone Encounter (Signed)
Copied from [place] [id number]. Topic: General - Other >> May 22, 2020  1:42 PM Rainey Pines A wrote: Patient returning call to schedule infusion, please advise

## 2020-05-22 NOTE — Progress Notes (Signed)
MyChart Video Visit    Virtual Visit via Video Note   This visit type was conducted due to national recommendations for restrictions regarding the COVID-19 Pandemic (e.g. social distancing) in an effort to limit this patient's exposure and mitigate transmission in our community. This patient is at least at moderate risk for complications without adequate follow up. This format is felt to be most appropriate for this patient at this time. Physical exam was limited by quality of the video and audio technology used for the visit.    Patient location: home Provider location: [place] involved in the visit: patient, provider  I discussed the limitations of evaluation and management by telemedicine and the availability of in person appointments. The patient expressed understanding and agreed to proceed.  Patient: Christina Holloway   DOB: [birth year]   59 y.o. Female  MRN: [id number] Visit Date: 05/22/2020  Today's healthcare provider: Lavon Paganini, MD   Chief Complaint  Patient presents with  . URI   Subjective    HPI  Symptoms include: loss of taste and smell, nasal congestion, lightheadedness, mild SOB No fever, cough Symptoms started 9/25 Positive test 9/28 at work  Has had [place] vaccine x2, last dose 11/05/19   Medications: Outpatient Medications Prior to Visit  Medication Sig  . amLODipine (NORVASC) 10 MG tablet Take 1 tablet (10 mg total) by mouth daily.  Marland Kitchen atenolol (TENORMIN) 25 MG tablet TAKE 1 TABLET BY MOUTH DAILY  . colchicine 0.6 MG tablet Take 1.2mg  PO x1 at first sign of gout flare, then 0.6mg  1 hour later. Continue 0.6mg  BID until 48 hours after resolution of flare.  . docusate sodium (COLACE) 100 MG capsule Take 1 capsule (100 mg total) by mouth daily.  Marland Kitchen losartan-hydrochlorothiazide (HYZAAR) 100-12.5 MG tablet TAKE 1 TABLET BY MOUTH DAILY   No facility-administered medications prior to visit.    Review of Systems  Constitutional:  Negative.   HENT: Positive for congestion, sinus pressure and sinus pain. Negative for postnasal drip, rhinorrhea and sore throat.   Respiratory: Positive for shortness of breath. Negative for apnea, cough, choking, chest tightness, wheezing and stridor.   Cardiovascular: Negative.   Gastrointestinal: Negative.   Neurological: Positive for headaches. Negative for dizziness and light-headedness.      Objective    There were no vitals taken for this visit.   Physical Exam Constitutional:      General: She is not in acute distress. Pulmonary:     Effort: Pulmonary effort is normal. No respiratory distress.  Neurological:     Mental Status: She is alert and oriented to person, place, and time. Mental status is at baseline.  Psychiatric:        Mood and Affect: Mood normal.        Behavior: Behavior normal.        Assessment & Plan     1. COVID-19 - new diagnosis - <7 days into illness and good candidate for mAb infusion - referral placed today - discussed need to quarantine 10 days from start of symptoms and until fever-free for at least 48 hours - discussed symptomatic management, natural course, and return precautions   Return if symptoms worsen or fail to improve.     I discussed the assessment and treatment plan with the patient. The patient was provided an opportunity to ask questions and all were answered. The patient agreed with the plan and demonstrated an understanding of the instructions.   The patient was advised  to call back or seek an in-person evaluation if the symptoms worsen or if the condition fails to improve as anticipated.   I, Lavon Paganini, MD, have reviewed all documentation for this visit. The documentation on 05/22/20 for the exam, diagnosis, procedures, and orders are all accurate and complete.   Clifton Kovacic, Dionne Bucy, MD, MPH [facility]

## 2020-05-22 NOTE — Telephone Encounter (Signed)
I referred her to [facility] and they will reach out to her.  We cannot schedule her

## 2020-05-22 NOTE — Patient Instructions (Signed)

## 2020-05-22 NOTE — Progress Notes (Signed)
  Diagnosis: COVID-19  Physician: Dr. Wright  Procedure: Covid Infusion Clinic Med: casirivimab\imdevimab infusion - Provided patient with casirivimab\imdevimab fact sheet for patients, parents and caregivers prior to infusion.  Complications: No immediate complications noted.  Discharge: Discharged home   Christina Holloway 05/22/2020  

## 2020-05-22 NOTE — Progress Notes (Signed)
I connected by phone with Christina Holloway on 05/22/2020 at 4:46 PM to discuss the potential use of an new treatment for mild to moderate COVID-19 viral infection in non-hospitalized patients.  This patient is a 59 y.o. female that meets the FDA criteria for Emergency Use Authorization of casirivimab\imdevimab.  Has a (+) direct SARS-CoV-2 viral test result  Has mild or moderate COVID-19   Is ? 58 years of age and weighs ? 40 kg  Is NOT hospitalized due to COVID-19  Is NOT requiring oxygen therapy or requiring an increase in baseline oxygen flow rate due to COVID-19  Is within 10 days of symptom onset  Has at least one of the high risk factor(s) for progression to severe COVID-19 and/or hospitalization as defined in EUA.  Specific high risk criteria : BMI > 25, Immunosuppressive Disease or Treatment and Cardiovascular disease or hypertension   Onset 9/25.    I have spoken and communicated the following to the patient or parent/caregiver:  1. FDA has authorized the emergency use of casirivimab\imdevimab for the treatment of mild to moderate COVID-19 in adults and pediatric patients with positive results of direct SARS-CoV-2 viral testing who are 5 years of age and older weighing at least 40 kg, and who are at high risk for progressing to severe COVID-19 and/or hospitalization.  2. The significant known and potential risks and benefits of casirivimab\imdevimab, and the extent to which such potential risks and benefits are unknown.  3. Information on available alternative treatments and the risks and benefits of those alternatives, including clinical trials.  4. Patients treated with casirivimab\imdevimab should continue to self-isolate and use infection control measures (e.g., wear mask, isolate, social distance, avoid sharing personal items, clean and disinfect high touch surfaces, and frequent handwashing) according to CDC guidelines.   5. The patient or parent/caregiver has the option  to accept or refuse casirivimab\imdevimab .  After reviewing this information with the patient, the patient has agreed to receive one of the available covid 19 monoclonal antibodies and will be provided an appropriate fact sheet prior to infusion.Beckey Rutter, [place], AGNP-C [phone] ([place])

## 2020-06-02 ENCOUNTER — Ambulatory Visit: Payer: Self-pay | Admitting: Family Medicine

## 2020-06-12 ENCOUNTER — Ambulatory Visit (INDEPENDENT_AMBULATORY_CARE_PROVIDER_SITE_OTHER): Payer: Managed Care, Other (non HMO) | Admitting: Family Medicine

## 2020-06-12 ENCOUNTER — Encounter: Payer: Self-pay | Admitting: Family Medicine

## 2020-06-12 ENCOUNTER — Other Ambulatory Visit: Payer: Self-pay

## 2020-06-12 VITALS — BP 130/81 | HR 54 | Temp 98.2°F | Wt 236.0 lb

## 2020-06-12 DIAGNOSIS — I10 Essential (primary) hypertension: Secondary | ICD-10-CM | POA: Diagnosis not present

## 2020-06-12 DIAGNOSIS — C674 Malignant neoplasm of posterior wall of bladder: Secondary | ICD-10-CM | POA: Diagnosis not present

## 2020-06-12 DIAGNOSIS — E7800 Pure hypercholesterolemia, unspecified: Secondary | ICD-10-CM | POA: Diagnosis not present

## 2020-06-12 DIAGNOSIS — F1010 Alcohol abuse, uncomplicated: Secondary | ICD-10-CM

## 2020-06-12 DIAGNOSIS — E559 Vitamin D deficiency, unspecified: Secondary | ICD-10-CM

## 2020-06-12 DIAGNOSIS — D509 Iron deficiency anemia, unspecified: Secondary | ICD-10-CM

## 2020-06-12 MED ORDER — LOSARTAN POTASSIUM-HCTZ 100-25 MG PO TABS: 1.0000 | ORAL_TABLET | Freq: Every day | ORAL | 3 refills | Status: DC

## 2020-06-12 NOTE — Assessment & Plan Note (Signed)
Reviewed recent lipid panel Not currently on a statin Discussed importance of lowering cholesterol given ASCVD 10-year risk of 8.8% Patient will try lifestyle interventions for 3 months and we will recheck FLP and CMP If ASCVD risk is still high, we will consider statin at that time

## 2020-06-12 NOTE — Assessment & Plan Note (Signed)
BMI 36.96 and associated with HTN, HLD Discussed importance of healthy weight management Discussed diet and exercise

## 2020-06-12 NOTE — Assessment & Plan Note (Signed)
Status post resection Followed by urology with regular cystoscopy

## 2020-06-12 NOTE — Progress Notes (Signed)
Established patient visit   Patient: Christina Holloway   DOB: 03/06/1961   59 y.o. Female  MRN: [id number] Visit Date: 06/12/2020  Today's healthcare provider: Lavon Paganini, MD   Chief Complaint  Patient presents with   Hyperlipidemia   Hypertension   Subjective    HPI  Hypertension, follow-up  BP Readings from Last 3 Encounters:  06/12/20 130/81  05/22/20 (!) 147/85  03/31/20 120/67   Wt Readings from Last 3 Encounters:  06/12/20 236 lb (107 kg)  03/31/20 232 lb (105.2 kg)  03/24/20 234 lb (106.1 kg)     She was last seen for hypertension 4 months ago.  BP at that visit was 115/71. Management since that visit includes no changes.  She reports excellent compliance with treatment. She is having side effects.  She is following a Low Sodium diet. She is not exercising. She does not smoke.  Use of agents associated with hypertension: none.   Outside blood pressures are normal when she checks it at work.   --------------------------------------------------------------------------------------------------- Lipid/Cholesterol, Follow-up  Last lipid panel Other pertinent labs  Lab Results  Component Value Date   CHOL 233 (H) 01/28/2020   HDL 40 01/28/2020   LDLCALC 159 (H) 01/28/2020   TRIG 186 (H) 01/28/2020   CHOLHDL 5.8 (H) 01/28/2020   Lab Results  Component Value Date   ALT 32 02/15/2020   AST 23 02/15/2020   PLT 316 08/07/2019   TSH 1.230 02/07/2017     She was last seen for this 4 months ago.  Management since that visit includes no changes.  She reports excellent compliance with treatment.  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 ([place]) 09/07/2019   Right knee pain 12/08/2015   History of colonic polyps    Gout 05/02/2015   Malaise and fatigue 05/02/2015   Hypercholesterolemia without hypertriglyceridemia 04/29/2009   Anemia, iron deficiency 08/24/2003   Essential (primary) hypertension 08/24/2003   Past Medical History:  Diagnosis Date   Abnormal LFTs 05/02/2015   Anemia, iron deficiency 08/24/2003   in past   Arthritis    lower back, legs   Calcium blood increased 05/02/2015   Cancer ([place])    Decreased potassium in the blood 05/02/2015   Essential (primary) hypertension 08/24/2003   Gout 05/02/2015   Hypercholesterolemia without hypertriglyceridemia 04/29/2009   Malaise and fatigue 05/02/2015   Shortness of breath dyspnea    1 flight stairs   Wears dentures    full upper, partial lower   Social History   Tobacco Use   Smoking status: Never Smoker   Smokeless tobacco: Never Used  Substance Use Topics   Alcohol use: Yes    Alcohol/week: 36.0 standard drinks    Types: 36 Cans of beer per week   Drug use: No   Allergies  Allergen Reactions   Penicillins Rash    Did it involve swelling of the face/tongue/throat, SOB, or low BP? No Did it involve sudden or severe  rash/hives, skin peeling, or any reaction on the inside of your mouth or nose? No Did you need to seek medical attention at a hospital or doctor's office? No When did it last happen?childhood allergy If all above answers are NO, may proceed with cephalosporin use.      Medications: Outpatient Medications Prior to Visit  Medication Sig   amLODipine (NORVASC) 10 MG tablet  Take 1 tablet (10 mg total) by mouth daily.   colchicine 0.6 MG tablet Take 1.2mg  PO x1 at first sign of gout flare, then 0.6mg  1 hour later. Continue 0.6mg  BID until 48 hours after resolution of flare.   docusate sodium (COLACE) 100 MG capsule Take 1 capsule (100 mg total) by mouth daily.   [DISCONTINUED] atenolol (TENORMIN) 25 MG tablet TAKE 1 TABLET BY MOUTH DAILY   [DISCONTINUED] losartan-hydrochlorothiazide (HYZAAR) 100-12.5 MG tablet TAKE 1 TABLET BY MOUTH DAILY   No facility-administered medications prior to visit.    Review of Systems  Constitutional: Positive for fatigue. Negative for activity change, appetite change, chills, diaphoresis, fever and unexpected weight change.  Respiratory: Positive for shortness of breath. Negative for apnea, cough, choking, chest tightness, wheezing and stridor.   Cardiovascular: Negative.   Gastrointestinal: Positive for diarrhea. Negative for abdominal distention, abdominal pain, anal bleeding, blood in stool, constipation, nausea, rectal pain and vomiting.  Neurological: Negative for dizziness, light-headedness and headaches.      Objective    BP 130/81 (BP Location: Left Arm, Patient Position: Sitting, Cuff Size: Large)    Pulse (!) 54    Temp 98.2 F (36.8 C) (Oral)    Wt 236 lb (107 kg)    BMI 36.96 kg/m    Physical Exam Vitals reviewed.  Constitutional:      General: She is not in acute distress.    Appearance: Normal appearance. She is well-developed. She is not diaphoretic.  HENT:     Head: Normocephalic and atraumatic.  Eyes:     General: No scleral icterus.    Conjunctiva/sclera: Conjunctivae normal.  Neck:     Thyroid: No thyromegaly.  Cardiovascular:     Rate and Rhythm: Normal rate and regular rhythm.     Pulses: Normal pulses.     Heart sounds: Normal heart sounds. No murmur heard.   Pulmonary:     Effort: Pulmonary effort is normal. No respiratory distress.     Breath sounds: Normal breath sounds. No wheezing,  rhonchi or rales.  Musculoskeletal:     Cervical back: Neck supple.     Right lower leg: No edema.     Left lower leg: No edema.  Lymphadenopathy:     Cervical: No cervical adenopathy.  Skin:    General: Skin is warm and dry.     Findings: No rash.  Neurological:     Mental Status: She is alert and oriented to person, place, and time. Mental status is at baseline.  Psychiatric:        Mood and Affect: Mood normal.        Behavior: Behavior normal.       No results found for any visits on 06/12/20.  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 (HCC)    Status post resection Followed by urology with regular cystoscopy        Other   Anemia, iron deficiency    Likely related to hematuria from bladder cancer Recheck CBC to see if it has resolved      Relevant Orders   CBC w/Diff/Platelet   Hypercholesterolemia without hypertriglyceridemia    Reviewed recent lipid panel Not currently on a statin Discussed importance of lowering cholesterol given ASCVD 10-year risk of 8.8% Patient will try lifestyle interventions for 3 months and we will recheck FLP and CMP If ASCVD risk is still high, we will consider statin at that time      Relevant Medications   losartan-hydrochlorothiazide (HYZAAR) 100-25 MG tablet   Morbid obesity (HCC)    BMI 36.96 and associated with HTN, HLD Discussed importance of healthy weight management Discussed diet and exercise       Alcohol abuse     Has cut back on alcohol intake No longer drinking every day       Other Visit Diagnoses    Avitaminosis D       Relevant Orders   VITAMIN D 25 Hydroxy (Vit-D Deficiency, Fractures)       Return in about 3 months (around 09/12/2020) for CPE.      I, Lavon Paganini, MD, have reviewed all documentation for this visit. The documentation on 06/12/20 for the exam, diagnosis, procedures, and orders are all accurate and complete.   Jaydian Santana, Dionne Bucy, MD, MPH [facility]

## 2020-06-12 NOTE — Patient Instructions (Addendum)
Stop atenolol Dose of losartan-hctz changed Continue amlodipine  I recommend diet low in saturated fat and regular exercise - 30 min at least 5 times per week    High Cholesterol  High cholesterol is a condition in which the blood has high levels of a white, waxy, fat-like substance (cholesterol). The human body needs small amounts of cholesterol. The liver makes all the cholesterol that the body needs. Extra (excess) cholesterol comes from the food that we eat. Cholesterol is carried from the liver by the blood through the blood vessels. If you have high cholesterol, deposits (plaques) may build up on the walls of your blood vessels (arteries). Plaques make the arteries narrower and stiffer. Cholesterol plaques increase your risk for heart attack and stroke. Work with your health care provider to keep your cholesterol levels in a healthy range. What increases the risk? This condition is more likely to develop in people who:  Eat foods that are high in animal fat (saturated fat) or cholesterol.  Are overweight.  Are not getting enough exercise.  Have a family history of high cholesterol. What are the signs or symptoms? There are no symptoms of this condition. How is this diagnosed? This condition may be diagnosed from the results of a blood test.  If you are older than age 61, your health care provider may check your cholesterol every 4-6 years.  You may be checked more often if you already have high cholesterol or other risk factors for heart disease. The blood test for cholesterol measures:  "Bad" cholesterol (LDL cholesterol). This is the main type of cholesterol that causes heart disease. The desired level for LDL is less than 100.  "Good" cholesterol (HDL cholesterol). This type helps to protect against heart disease by cleaning the arteries and carrying the LDL away. The desired level for HDL is 60 or higher.  Triglycerides. These are fats that the body can store or burn for  energy. The desired number for triglycerides is lower than 150.  Total cholesterol. This is a measure of the total amount of cholesterol in your blood, including LDL cholesterol, HDL cholesterol, and triglycerides. A healthy number is less than 200. How is this treated? This condition is treated with diet changes, lifestyle changes, and medicines. Diet changes  This may include eating more whole grains, fruits, vegetables, nuts, and fish.  This may also include cutting back on red meat and foods that have a lot of added sugar. Lifestyle changes  Changes may include getting at least 40 minutes of aerobic exercise 3 times a week. Aerobic exercises include walking, biking, and swimming. Aerobic exercise along with a healthy diet can help you maintain a healthy weight.  Changes may also include quitting smoking. Medicines  Medicines are usually given if diet and lifestyle changes have failed to reduce your cholesterol to healthy levels.  Your health care provider may prescribe a statin medicine. Statin medicines have been shown to reduce cholesterol, which can reduce the risk of heart disease. Follow these instructions at home: Eating and drinking If told by your health care provider:  Eat chicken (without skin), fish, veal, shellfish, ground [place] breast, and round or loin cuts of red meat.  Do not eat fried foods or fatty meats, such as hot dogs and salami.  Eat plenty of fruits, such as apples.  Eat plenty of vegetables, such as broccoli, potatoes, and carrots.  Eat beans, peas, and lentils.  Eat grains such as barley, rice, couscous, and bulgur wheat.  Eat  pasta without cream sauces.  Use skim or nonfat milk, and eat low-fat or nonfat yogurt and cheeses.  Do not eat or drink whole milk, cream, ice cream, egg yolks, or hard cheeses.  Do not eat stick margarine or tub margarines that contain trans fats (also called partially hydrogenated oils).  Do not eat saturated  tropical oils, such as coconut oil and palm oil.  Do not eat cakes, cookies, crackers, or other baked goods that contain trans fats.  General instructions  Exercise as directed by your health care provider. Increase your activity level with activities such as gardening, walking, and taking the stairs.  Take over-the-counter and prescription medicines only as told by your health care provider.  Do not use any products that contain nicotine or tobacco, such as cigarettes and e-cigarettes. If you need help quitting, ask your health care provider.  Keep all follow-up visits as told by your health care provider. This is important. Contact a health care provider if:  You are struggling to maintain a healthy diet or weight.  You need help to start on an exercise program.  You need help to stop smoking. Get help right away if:  You have chest pain.  You have trouble breathing. This information is not intended to replace advice given to you by your health care provider. Make sure you discuss any questions you have with your health care provider. Document Revised: 08/12/2017 Document Reviewed: 02/07/2016 Elsevier Patient Education  [place].

## 2020-06-12 NOTE — Assessment & Plan Note (Signed)
Well-controlled Patient would like to consolidate her medications She is also slightly bradycardic today Stop atenolol and increase HCTZ dose to 25 mg Continue losartan and amlodipine at current doses Reviewed recent metabolic panel

## 2020-06-12 NOTE — Assessment & Plan Note (Signed)
Likely related to hematuria from bladder cancer Recheck CBC to see if it has resolved

## 2020-06-12 NOTE — Assessment & Plan Note (Signed)
Has cut back on alcohol intake No longer drinking every day

## 2020-06-13 LAB — VITAMIN D 25 HYDROXY (VIT D DEFICIENCY, FRACTURES): Vit D, 25-Hydroxy: 10 ng/mL — ABNORMAL LOW (ref 30.0–100.0)

## 2020-06-13 LAB — CBC WITH DIFFERENTIAL/PLATELET
Basophils Absolute: 0.1 10*3/uL (ref 0.0–0.2)
Basos: 1 %
EOS (ABSOLUTE): 0.2 10*3/uL (ref 0.0–0.4)
Eos: 5 %
Hematocrit: 38.1 % (ref 34.0–46.6)
Hemoglobin: 12.5 g/dL (ref 11.1–15.9)
Immature Grans (Abs): 0 10*3/uL (ref 0.0–0.1)
Immature Granulocytes: 0 %
Lymphocytes Absolute: 1.8 10*3/uL (ref 0.7–3.1)
Lymphs: 33 %
MCH: 30 pg (ref 26.6–33.0)
MCHC: 32.8 g/dL (ref 31.5–35.7)
MCV: 92 fL (ref 79–97)
Monocytes Absolute: 0.4 10*3/uL (ref 0.1–0.9)
Monocytes: 7 %
Neutrophils Absolute: 2.9 10*3/uL (ref 1.4–7.0)
Neutrophils: 54 %
Platelets: 289 10*3/uL (ref 150–450)
RBC: 4.16 x10E6/uL (ref 3.77–5.28)
RDW: 13 % (ref 11.7–15.4)
WBC: 5.4 10*3/uL (ref 3.4–10.8)

## 2020-06-17 ENCOUNTER — Encounter: Payer: Self-pay | Admitting: Family Medicine

## 2020-06-18 MED ORDER — VITAMIN D (ERGOCALCIFEROL) 1.25 MG (50000 UNIT) PO CAPS
50000.0000 [IU] | ORAL_CAPSULE | ORAL | 0 refills | Status: DC
Start: 2020-06-18 — End: 2021-04-08

## 2020-07-07 ENCOUNTER — Other Ambulatory Visit: Payer: Self-pay

## 2020-07-07 DIAGNOSIS — C674 Malignant neoplasm of posterior wall of bladder: Secondary | ICD-10-CM

## 2020-07-08 ENCOUNTER — Other Ambulatory Visit: Payer: Self-pay

## 2020-07-08 ENCOUNTER — Other Ambulatory Visit
Admission: RE | Admit: 2020-07-08 | Discharge: 2020-07-08 | Disposition: A | Discharge status: Home / Self Care | Payer: Managed Care, Other (non HMO) | Attending: Urology | Admitting: Urology

## 2020-07-08 ENCOUNTER — Ambulatory Visit (INDEPENDENT_AMBULATORY_CARE_PROVIDER_SITE_OTHER): Payer: Managed Care, Other (non HMO) | Admitting: Urology

## 2020-07-08 ENCOUNTER — Encounter: Payer: Self-pay | Admitting: Urology

## 2020-07-08 VITALS — BP 157/92 | HR 78 | Ht 68.0 in | Wt 236.0 lb

## 2020-07-08 DIAGNOSIS — C674 Malignant neoplasm of posterior wall of bladder: Secondary | ICD-10-CM

## 2020-07-08 LAB — URINALYSIS, COMPLETE (UACMP) WITH MICROSCOPIC
Bilirubin Urine: NEGATIVE
Glucose, UA: NEGATIVE mg/dL
Ketones, ur: NEGATIVE mg/dL
Leukocytes,Ua: NEGATIVE
Nitrite: NEGATIVE
Protein, ur: NEGATIVE mg/dL
Specific Gravity, Urine: 1.02 (ref 1.005–1.030)
WBC, UA: NONE SEEN WBC/hpf (ref 0–5)
pH: 6 (ref 5.0–8.0)

## 2020-07-08 MED ORDER — LIDOCAINE HCL URETHRAL/MUCOSAL 2 % EX GEL
1.0000 | Freq: Once | CUTANEOUS | Status: AC
Start: 2020-07-08 — End: 2020-07-08
  Administered 2020-07-08: 1 via URETHRAL

## 2020-07-08 NOTE — Progress Notes (Signed)
Bladder cancer surveillance note  UROLOGIC HISTORY Christina Holloway is a 59 y.o. female who originally presented in December 2020 with gross hematuria and was found of a 3 cm bladder tumor at the right posterior wall with no evidence of metastatic disease.  She underwent TURBT on 08/10/2019 with pathology showing high-grade T1 with no muscle invasion, and repeat TURBT on 09/14/2019 showed no evidence of malignancy.  She completed induction BCG in April 2021.  She is a never smoker.  Initial Diagnosis of Bladder Year: 07/2019 Pathology: High-grade T1, muscle present and not involved  Recurrent Bladder Cancer Diagnosis None  Treatments for Bladder Cancer 08/10/2019: Initial TURBT 3 cm, HG T1 09/14/2019: 2nd look TURBT, pathology negative 3-11/2019: Induction BCG x6 03/2020: mBCG  AUA Risk Category High  Cystoscopy Procedure Note: After informed consent and discussion of the procedure and its risks, Christina Holloway was positioned and prepped in the standard fashion. Cystoscopy was performed with the a flexible cystoscope. The urethra, bladder neck and entire bladder was visualized in a standard fashion, and mucosa was normal throughout.  There is a well-healed scar at the right posterior bladder wall. The ureteral orifices were visualized in their normal location and orientation.  No abnormalities on retroflexion.  Mucosa grossly normal throughout with no erythema or suspicious lesions. Cytology sent.  Continue with maintenance BCG at 3, 6, 12, 18, 24, 30, and 36 months RTC for cysto in 3-4 months Repeat CTU ~12 months  Nickolas Madrid, MD 07/08/2020

## 2020-07-10 ENCOUNTER — Telehealth: Payer: Self-pay

## 2020-07-10 LAB — CYTOLOGY - NON PAP

## 2020-07-10 NOTE — Telephone Encounter (Signed)
Called pt informed her of the information below. Pt gave verbal understanding.  

## 2020-07-10 NOTE — Telephone Encounter (Signed)
-----   Message from Billey Co, MD sent at 07/08/2020  3:04 PM EST ----- Regarding: BCG Please set her up for next mBCG cycle, should be completed before end of Dec. She will then see me in 3-4 months for another cysto, thanks  Nickolas Madrid, MD 07/08/2020

## 2020-07-10 NOTE — Telephone Encounter (Signed)
-----   Message from Billey Co, MD sent at 07/10/2020  4:37 PM EST ----- Good news, no cancer cells on urine sample, keep follow-up as scheduled

## 2020-07-14 NOTE — Telephone Encounter (Signed)
Patient notified and scheduled, reviewed instructions with her she verbalized understanding

## 2020-07-20 NOTE — Progress Notes (Signed)
BCG Bladder Instillation  BCG # 1/3  Due to Bladder Cancer patient is present today for a BCG treatment. Patient was cleaned and prepped in a sterile fashion with betadine. A 14 FR catheter was inserted, urine return was noted 30 ml, urine was yellow in color.  76ml of reconstituted BCG was instilled into the bladder. The catheter was then removed. Patient tolerated well, no complications were noted  Performed by: Zara Council, PA-C and Edwin Dada, CMA   Follow up/ Additional notes: Follow up in one week for # 2 BCG

## 2020-07-21 ENCOUNTER — Other Ambulatory Visit
Admission: RE | Admit: 2020-07-21 | Discharge: 2020-07-21 | Disposition: A | Discharge status: Home / Self Care | Payer: Managed Care, Other (non HMO) | Attending: Urology | Admitting: Urology

## 2020-07-21 ENCOUNTER — Ambulatory Visit (INDEPENDENT_AMBULATORY_CARE_PROVIDER_SITE_OTHER): Payer: Managed Care, Other (non HMO) | Admitting: Urology

## 2020-07-21 ENCOUNTER — Other Ambulatory Visit: Payer: Self-pay

## 2020-07-21 ENCOUNTER — Other Ambulatory Visit: Payer: Self-pay | Admitting: *Deleted

## 2020-07-21 ENCOUNTER — Encounter: Payer: Self-pay | Admitting: Urology

## 2020-07-21 VITALS — BP 138/83 | HR 69 | Ht 68.0 in | Wt 240.0 lb

## 2020-07-21 DIAGNOSIS — C674 Malignant neoplasm of posterior wall of bladder: Secondary | ICD-10-CM | POA: Diagnosis not present

## 2020-07-21 LAB — URINALYSIS, COMPLETE (UACMP) WITH MICROSCOPIC
Bilirubin Urine: NEGATIVE
Glucose, UA: NEGATIVE mg/dL
Ketones, ur: NEGATIVE mg/dL
Leukocytes,Ua: NEGATIVE
Nitrite: NEGATIVE
Protein, ur: NEGATIVE mg/dL
Specific Gravity, Urine: 1.025 (ref 1.005–1.030)
WBC, UA: NONE SEEN WBC/hpf (ref 0–5)
pH: 6 (ref 5.0–8.0)

## 2020-07-21 MED ORDER — BCG LIVE 50 MG IS SUSR
3.2400 mL | Freq: Once | INTRAVESICAL | Status: AC
  Administered 2020-07-21: 81 mg via INTRAVESICAL

## 2020-07-27 NOTE — Progress Notes (Signed)
BCG Bladder Instillation  BCG # 2/3  Due to Bladder Cancer patient is present today for a BCG treatment. Patient was cleaned and prepped in a sterile fashion with betadine. A 14 FR catheter was inserted, urine return was noted 10 ml, urine was yellow clear in color.  18ml of reconstituted BCG was instilled into the bladder. The catheter was then removed. Patient tolerated well, no complications were noted  Performed by: Zara Council, PA-C and [place], CMA  Follow up/ Additional notes: return next week for #3/3

## 2020-07-28 ENCOUNTER — Encounter: Payer: Self-pay | Admitting: Urology

## 2020-07-28 ENCOUNTER — Other Ambulatory Visit: Payer: Self-pay

## 2020-07-28 ENCOUNTER — Ambulatory Visit (INDEPENDENT_AMBULATORY_CARE_PROVIDER_SITE_OTHER): Payer: Managed Care, Other (non HMO) | Admitting: Urology

## 2020-07-28 ENCOUNTER — Other Ambulatory Visit: Payer: Self-pay | Admitting: *Deleted

## 2020-07-28 ENCOUNTER — Other Ambulatory Visit
Admission: RE | Admit: 2020-07-28 | Discharge: 2020-07-28 | Disposition: A | Discharge status: Home / Self Care | Payer: Managed Care, Other (non HMO) | Attending: Urology | Admitting: Urology

## 2020-07-28 VITALS — Ht 68.0 in | Wt 240.0 lb

## 2020-07-28 DIAGNOSIS — C674 Malignant neoplasm of posterior wall of bladder: Secondary | ICD-10-CM | POA: Diagnosis present

## 2020-07-28 LAB — URINALYSIS, COMPLETE (UACMP) WITH MICROSCOPIC
Bilirubin Urine: NEGATIVE
Glucose, UA: NEGATIVE mg/dL
Ketones, ur: NEGATIVE mg/dL
Leukocytes,Ua: NEGATIVE
Nitrite: NEGATIVE
Protein, ur: NEGATIVE mg/dL
Specific Gravity, Urine: 1.02 (ref 1.005–1.030)
pH: 5.5 (ref 5.0–8.0)

## 2020-07-28 MED ORDER — BCG LIVE 50 MG IS SUSR
3.2400 mL | Freq: Once | INTRAVESICAL | Status: AC
  Administered 2020-07-28: 81 mg via INTRAVESICAL

## 2020-08-03 NOTE — Progress Notes (Signed)
BCG Bladder Instillation  BCG # 3/3  Due to Bladder Cancer patient is present today for a BCG treatment. Patient was cleaned and prepped in a sterile fashion with betadine. A 14 FR catheter was inserted, urine return was noted 10 ml, urine was yellow in color.  52ml of reconstituted BCG was instilled into the bladder. The catheter was then removed. Patient tolerated well, no complications were noted  Performed by: Zara Council, PA-C and Edwin Dada, CMA  Follow up/ Additional notes: February 22nd with Dr. Diamantina Providence for cystoscopy

## 2020-08-04 ENCOUNTER — Other Ambulatory Visit
Admission: RE | Admit: 2020-08-04 | Discharge: 2020-08-04 | Disposition: A | Discharge status: Home / Self Care | Payer: Managed Care, Other (non HMO) | Attending: Urology | Admitting: Urology

## 2020-08-04 ENCOUNTER — Encounter: Payer: Self-pay | Admitting: Urology

## 2020-08-04 ENCOUNTER — Ambulatory Visit (INDEPENDENT_AMBULATORY_CARE_PROVIDER_SITE_OTHER): Payer: Managed Care, Other (non HMO) | Admitting: Urology

## 2020-08-04 ENCOUNTER — Other Ambulatory Visit: Payer: Self-pay

## 2020-08-04 ENCOUNTER — Other Ambulatory Visit: Payer: Self-pay | Admitting: *Deleted

## 2020-08-04 VITALS — BP 162/91 | HR 66 | Ht 68.0 in | Wt 240.0 lb

## 2020-08-04 DIAGNOSIS — C674 Malignant neoplasm of posterior wall of bladder: Secondary | ICD-10-CM

## 2020-08-04 LAB — URINALYSIS, COMPLETE (UACMP) WITH MICROSCOPIC
Bilirubin Urine: NEGATIVE
Glucose, UA: NEGATIVE mg/dL
Ketones, ur: NEGATIVE mg/dL
Leukocytes,Ua: NEGATIVE
Nitrite: NEGATIVE
Protein, ur: NEGATIVE mg/dL
Specific Gravity, Urine: 1.025 (ref 1.005–1.030)
pH: 5.5 (ref 5.0–8.0)

## 2020-08-04 MED ORDER — BCG LIVE 50 MG IS SUSR
3.2400 mL | Freq: Once | INTRAVESICAL | Status: AC
  Administered 2020-08-04: 81 mg via INTRAVESICAL

## 2020-09-03 ENCOUNTER — Telehealth: Payer: Self-pay | Admitting: Family Medicine

## 2020-09-03 NOTE — Telephone Encounter (Signed)
Pharmacy called to inform the doctor that the patient's script for losartan-hydrochlorothiazide (HYZAAR) 100-25 MG tablet, is unavailable as is.  It will need to be split and the pharmacy can take a verbal order for this medication to be split between the Losartan and hydrochlorothiazide.  Please call to confirm at [phone]

## 2020-09-03 NOTE — Telephone Encounter (Signed)
Please approve split dosing with the pharmacy

## 2020-09-04 NOTE — Telephone Encounter (Signed)
Called pharmacy

## 2020-09-15 ENCOUNTER — Encounter: Payer: Managed Care, Other (non HMO) | Admitting: Family Medicine

## 2020-10-14 ENCOUNTER — Other Ambulatory Visit: Payer: Self-pay | Admitting: *Deleted

## 2020-10-14 ENCOUNTER — Other Ambulatory Visit: Payer: Self-pay

## 2020-10-14 ENCOUNTER — Other Ambulatory Visit
Admission: RE | Admit: 2020-10-14 | Discharge: 2020-10-14 | Disposition: A | Discharge status: Home / Self Care | Payer: Managed Care, Other (non HMO) | Attending: Urology | Admitting: Urology

## 2020-10-14 ENCOUNTER — Encounter: Payer: Self-pay | Admitting: Urology

## 2020-10-14 ENCOUNTER — Ambulatory Visit (INDEPENDENT_AMBULATORY_CARE_PROVIDER_SITE_OTHER): Payer: Managed Care, Other (non HMO) | Admitting: Urology

## 2020-10-14 VITALS — BP 148/82 | HR 76 | Ht 68.0 in | Wt 241.0 lb

## 2020-10-14 DIAGNOSIS — C672 Malignant neoplasm of lateral wall of bladder: Secondary | ICD-10-CM

## 2020-10-14 DIAGNOSIS — C674 Malignant neoplasm of posterior wall of bladder: Secondary | ICD-10-CM | POA: Diagnosis present

## 2020-10-14 LAB — URINALYSIS, COMPLETE (UACMP) WITH MICROSCOPIC
Bilirubin Urine: NEGATIVE
Glucose, UA: NEGATIVE mg/dL
Ketones, ur: NEGATIVE mg/dL
Leukocytes,Ua: NEGATIVE
Nitrite: NEGATIVE
Protein, ur: NEGATIVE mg/dL
Specific Gravity, Urine: 1.02 (ref 1.005–1.030)
pH: 5.5 (ref 5.0–8.0)

## 2020-10-14 NOTE — Progress Notes (Signed)
Bladder cancer surveillance note  UROLOGIC HISTORY Christina Holloway a 60 y.o.femalewho originally presented in December 2020 with gross hematuria and was found to have a 3 cm bladder tumor at the right posterior wall with no evidence of metastatic disease. She underwent TURBT on 08/10/2019 with pathology showing high-grade T1 with no muscle invasion, and repeat TURBT on 09/14/2019 showed no evidence of malignancy. She completed induction BCG in April 2021.  She is a never smoker.  Initial Diagnosis of Bladder Year:07/2019 Pathology:High-grade T1,muscle present and not involved  Recurrent Bladder Cancer Diagnosis None  Treatments for Bladder Cancer 08/10/2019:Initial TURBT 3 cm, HGT1 09/14/2019: 2ndlook TURBT,pathology negative 3-11/2019:Induction BCGx6 03/2020: mBCG #1 06/2020: mBCG #2  AUA Risk Category High  Cystoscopy Procedure Note: After informed consent and discussion of the procedure and its risks,Christina A Gantwas positioned and prepped in the standard fashion. Cystoscopy was performed with the a flexible cystoscope. The urethra, bladder neck and entire bladder was visualized in a standard fashion, and mucosa was normal throughout. Subtle 1cm patch of erythema at posterior bladder wall. The ureteral orifices were visualized in their normal location and orientation.No abnormalities on retroflexion. Cytology sent.  Call with cytology results- if positive biopsy and fulguration, if negative mBCG in May 2022 and cysto to follow Repeat CTU ~12 months  Christina Madrid, MD 10/14/2020

## 2020-10-14 NOTE — Addendum Note (Signed)
Addended by: Despina Hidden on: 10/14/2020 03:07 PM   Modules accepted: Orders

## 2020-10-16 LAB — CYTOLOGY - NON PAP

## 2020-10-17 ENCOUNTER — Telehealth: Payer: Self-pay

## 2020-10-17 NOTE — Telephone Encounter (Signed)
Called pt no answer. Left detailed message for pt informing her of the information below per DPR. MyChart message also sent. Message routed to Judson Roch for BCG scheduling. Cysto scheduled for June.

## 2020-10-17 NOTE — Telephone Encounter (Signed)
-----   Message from Billey Co, MD sent at 10/17/2020 11:32 AM EST ----- Doristine Devoid news, cytology negative.  Please make sure she is set up for 3 sessions of maintenance BCG in May, and she should have a cystoscopy scheduled with me in June, thank you  Nickolas Madrid, MD 10/17/2020

## 2020-10-22 NOTE — Telephone Encounter (Signed)
Patient previously scheduled

## 2020-10-24 ENCOUNTER — Telehealth: Payer: Self-pay | Admitting: Family Medicine

## 2020-10-24 NOTE — Telephone Encounter (Signed)
Spoke to patient and she reports that she is having elbow and lower back pain that is progressively getting worse. She reports that it is better after taking Tylenol. However, the pain does not go away. Patient wanted to be seen. Appt scheduled w/Dr B to discuss treatment.

## 2020-10-24 NOTE — Telephone Encounter (Signed)
Patient would like the doctor or nurse to call her to advise on some medication for her joints.  She stated she is having pain and would like to know what the doctor recommends.  CP# [phone]

## 2020-10-24 NOTE — Telephone Encounter (Signed)
lmtcb

## 2020-10-28 ENCOUNTER — Ambulatory Visit: Payer: Self-pay | Admitting: Family Medicine

## 2020-11-21 ENCOUNTER — Telehealth: Payer: Self-pay | Admitting: Family Medicine

## 2020-11-21 ENCOUNTER — Ambulatory Visit: Payer: Self-pay | Admitting: *Deleted

## 2020-11-21 MED ORDER — COLCHICINE 0.6 MG PO TABS: ORAL_TABLET | ORAL | 1 refills | Status: DC

## 2020-11-21 NOTE — Telephone Encounter (Signed)
Medication: colchicine 0.6 MG tablet Has the pt contacted their pharmacy? no Preferred pharmacy: [facility], [address]  Please be advised refills may take up to 3 business days.  We ask that you follow up with your pharmacy.  Pt states she has gout in her heel. Pt states Dr B will prescribe gout med when this happens. Advised pt it has been since 2019 since this med was prescribed, and she may need appt. Pt declined appt, stating she cannot walk, just needs some relief.

## 2020-11-21 NOTE — Telephone Encounter (Addendum)
Patient called c/o bilateral ankle and back of foot pain. Started this am at 3 am. Left foot hurts worse than right. Severe pain causing difficulty walking. Patient reports she has to lift her left foot and set it onto floor due to pain from "gout'. Patient denies hurting feet or ankles. Denies rash, fever, numbness. Some swelling noted and painful to touch. Misplaced her colchicine and requesting a refill .   Future visit schedule 12/02/20. Care advise given to patient to go to [facility] or ED if symptoms worsen if she cannot get additional refill. Patient verbalized understanding .   Answer Assessment - Initial Assessment Questions 1. ONSET: "When did the pain start?"      *No Answer* 2. LOCATION: "Where is the pain located?"      *No Answer* 3. PAIN: "How bad is the pain?"    (Scale 1-10; or mild, moderate, severe)  - MILD (1-3): doesn't interfere with normal activities.   - MODERATE (4-7): interferes with normal activities (e.g., work or school) or awakens from sleep, limping.   - SEVERE (8-10): excruciating pain, unable to do any normal activities, unable to walk.      *No Answer* 4. WORK OR EXERCISE: "Has there been any recent work or exercise that involved this part of the body?"      *No Answer* 5. CAUSE: "What do you think is causing the foot pain?"     *No Answer* 6. OTHER SYMPTOMS: "Do you have any other symptoms?" (e.g., leg pain, rash, fever, numbness)     *No Answer* 7. PREGNANCY: "Is there any chance you are pregnant?" "When was your last menstrual period?"     *No Answer*  Protocols used: FOOT PAIN-A-AH

## 2020-11-21 NOTE — Telephone Encounter (Incomplete)
Pt is requesting a call back from the office, she has misplaced her gout medication and would like

## 2020-11-24 MED ORDER — COLCHICINE 0.6 MG PO TABS: ORAL_TABLET | ORAL | 1 refills | Status: DC

## 2020-11-24 NOTE — Telephone Encounter (Signed)
Please advise 

## 2020-11-24 NOTE — Telephone Encounter (Signed)
Ok to refill colchicine as previously filled. Of note, this should have gone to another provider while I was out of the office.

## 2020-11-24 NOTE — Telephone Encounter (Signed)
Medication sent into the pharmacy. 

## 2020-11-24 NOTE — Telephone Encounter (Signed)
Hi! Can you hit "complete" on your note so it'll let me close the encounter? Thanks!

## 2020-11-24 NOTE — Telephone Encounter (Signed)
See previous phone note. Ok to fill colchicine.

## 2020-11-27 ENCOUNTER — Telehealth: Payer: Self-pay

## 2020-11-27 DIAGNOSIS — Z1231 Encounter for screening mammogram for malignant neoplasm of breast: Secondary | ICD-10-CM

## 2020-11-27 NOTE — Telephone Encounter (Signed)
Ok to order mammogram for patient before her CPE? Please advise. Thanks!

## 2020-11-27 NOTE — Telephone Encounter (Signed)
Copied from [place] [id number]. Topic: General - Other >> Nov 27, 2020  3:53 PM Tessa Lerner A wrote: Reason for CRM: Patient has concerns related to scheduling their breast exam  Patient normally schedules their breast exam shortly after their annual physical  Patient is concerned that with their physical being rescheduled to 04/30/21 after their breast examination there may be issues coordinating information and care  Please contact to further advise when possible

## 2020-12-01 NOTE — Telephone Encounter (Signed)
Patient has been advised of order. KW

## 2020-12-01 NOTE — Telephone Encounter (Signed)
  RECOMMENDATION: Screening mammogram in one year. (Code:[id number])   BI-RADS CATEGORY  1: Negative.     Electronically Signed   By: Ammie Ferrier M.D.   On: 04/14/2020 12:54  Ok to place order as above and advise patient to keep mammogram order place under PCP since I will not be here.

## 2020-12-01 NOTE — Addendum Note (Signed)
Addended by: Minette Headland on: 12/01/2020 09:28 AM   Modules accepted: Orders

## 2020-12-02 ENCOUNTER — Encounter: Payer: Managed Care, Other (non HMO) | Admitting: Family Medicine

## 2020-12-22 ENCOUNTER — Ambulatory Visit (INDEPENDENT_AMBULATORY_CARE_PROVIDER_SITE_OTHER): Payer: Managed Care, Other (non HMO) | Admitting: Physician Assistant

## 2020-12-22 ENCOUNTER — Other Ambulatory Visit: Payer: Self-pay

## 2020-12-22 DIAGNOSIS — C674 Malignant neoplasm of posterior wall of bladder: Secondary | ICD-10-CM

## 2020-12-22 MED ORDER — BCG LIVE 50 MG IS SUSR
3.2400 mL | Freq: Once | INTRAVESICAL | Status: AC
  Administered 2020-12-22: 81 mg via INTRAVESICAL

## 2020-12-22 NOTE — Progress Notes (Signed)
BCG Bladder Instillation  BCG # 1 of 3  Due to Bladder Cancer patient is present today for a BCG treatment. Patient was cleaned and prepped in a sterile fashion with betadine. A 14FR catheter was inserted, urine return was noted 43ml, urine was yellow in color.  44ml of reconstituted BCG was instilled into the bladder. The catheter was then removed. Patient tolerated well, no complications were noted  Performed by: Debroah Loop, PA-C and Bradly Bienenstock, CMA  Additional notes: Mild pyuria and microscopic hematuria on microscopy today with stable bacteriuria. Patient denies infective sx, will send for culture to confirm.  Follow up: 1 week for BCG #2 of 3

## 2020-12-23 LAB — URINALYSIS, COMPLETE
Bilirubin, UA: NEGATIVE
Glucose, UA: NEGATIVE
Ketones, UA: NEGATIVE
Nitrite, UA: NEGATIVE
Protein,UA: NEGATIVE
Specific Gravity, UA: 1.02 (ref 1.005–1.030)
Urobilinogen, Ur: 0.2 mg/dL (ref 0.2–1.0)
pH, UA: 6 (ref 5.0–7.5)

## 2020-12-23 LAB — MICROSCOPIC EXAMINATION

## 2020-12-25 LAB — CULTURE, URINE COMPREHENSIVE

## 2020-12-26 ENCOUNTER — Ambulatory Visit: Payer: Managed Care, Other (non HMO) | Admitting: Nurse Practitioner

## 2020-12-26 VITALS — BP 164/94 | HR 78 | Temp 98.0°F | Resp 18 | Wt 239.0 lb

## 2020-12-26 DIAGNOSIS — I10 Essential (primary) hypertension: Secondary | ICD-10-CM | POA: Diagnosis not present

## 2020-12-26 DIAGNOSIS — H00015 Hordeolum externum left lower eyelid: Secondary | ICD-10-CM

## 2020-12-26 MED ORDER — KETOROLAC TROMETHAMINE 0.5 % OP SOLN
1.0000 [drp] | Freq: Three times a day (TID) | OPHTHALMIC | 0 refills | Status: DC | PRN
Start: 2020-12-26 — End: 2021-11-09

## 2020-12-26 MED ORDER — ERYTHROMYCIN 5 MG/GM OP OINT: 1.0000 "application " | TOPICAL_OINTMENT | Freq: Three times a day (TID) | OPHTHALMIC | 0 refills | Status: DC | PRN

## 2020-12-26 MED ORDER — AMLODIPINE BESYLATE 10 MG PO TABS: 10.0000 mg | ORAL_TABLET | Freq: Every day | ORAL | 2 refills | Status: DC

## 2020-12-26 NOTE — Progress Notes (Signed)
  Subjective:     Patient ID: Christina Holloway, female   DOB: 05-29-1961, 60 y.o.   MRN: [id number]  Eye Problem  The right eye is affected. This is a new problem. The current episode started in the past 7 days. The problem occurs constantly. The problem has been gradually worsening. There was no injury mechanism. The pain is at a severity of 5/10. There is no known exposure to pink eye. She does not wear contacts. Associated symptoms include blurred vision, eye redness, a foreign body sensation, itching and photophobia. Pertinent negatives include no fever, nausea, recent URI or vomiting. Eye discharge: watery. She has tried nothing for the symptoms.   Patient also reports she is out of her Norvasc for her HTN.  Review of Systems  Constitutional: Negative for fever.  Eyes: Positive for blurred vision, photophobia, redness and itching. Eye discharge: watery.  Gastrointestinal: Negative for nausea and vomiting.  All other systems reviewed and are negative.      Objective: BP (!) 164/94 (BP Location: Left Arm, Patient Position: Sitting, Cuff Size: Large)   Pulse 78   Temp 98 F (36.7 C) (Temporal)   Resp 18   Wt 239 lb (108.4 kg)   SpO2 99%   BMI 36.34 kg/m     Physical Exam Vitals and nursing note reviewed.  Constitutional:      Appearance: Normal appearance.  HENT:     Head: Normocephalic.  Eyes:     General: Lids are everted, no foreign bodies appreciated. Gaze aligned appropriately.        Right eye: Hordeolum present. No foreign body.        Left eye: No foreign body.     Extraocular Movements: Extraocular movements intact.     Right eye: Normal extraocular motion and no nystagmus.     Left eye: No nystagmus.     Conjunctiva/sclera:     Right eye: Right conjunctiva is injected. No exudate or hemorrhage.    Pupils: Pupils are equal, round, and reactive to light.     Right eye: No corneal abrasion or fluorescein uptake. Seidel exam negative.     Comments: Tetracaine eye drop to  right eye. Upper lid inverted and no FB noted. Eye stained with fluorescein and no corneal abrasion or uptake noted. Tiny raised are to the lower lid c/w sty.    Musculoskeletal:        General: Normal range of motion.  Skin:    General: Skin is warm and dry.  Neurological:     Mental Status: She is alert.  Psychiatric:        Mood and Affect: Mood normal.       Assessment:    1. Hordeolum externum of left lower eyelid - ketorolac (ACULAR) 0.5 % ophthalmic solution; Place 1 drop into the right eye 3 (three) times daily as needed.  Dispense: 5 mL; Refill: 0 - erythromycin ophthalmic ointment; Place 1 application into the right eye 3 (three) times daily as needed. Use the eye ointment for one week.  Dispense: 3.5 g; Refill: 0  2. Essential (primary) hypertension - amLODipine (NORVASC) 10 MG tablet; Take 1 tablet (10 mg total) by mouth daily.  Dispense: 90 tablet; Refill: 2    Discussed with the patient clinical findings and plan of care. Patient given the opportunity to ask questions. All questioned fully answered and patient voices understanding. Marland Kitchen She will call or return if symptoms worsen.

## 2020-12-26 NOTE — Patient Instructions (Addendum)
Apply warm wet compresses to the affected area 3 times a day. Use the drops and ointment as directed. If symptoms are not improving or are getting worse you will need to follow up with your eye doctor.  Hypertension, Adult High blood pressure (hypertension) is when the force of blood pumping through the arteries is too strong. The arteries are the blood vessels that carry blood from the heart throughout the body. Hypertension forces the heart to work harder to pump blood and may cause arteries to become narrow or stiff. Untreated or uncontrolled hypertension can cause a heart attack, heart failure, a stroke, kidney disease, and other problems. A blood pressure reading consists of a higher number over a lower number. Ideally, your blood pressure should be below 120/80. The first ("top") number is called the systolic pressure. It is a measure of the pressure in your arteries as your heart beats. The second ("bottom") number is called the diastolic pressure. It is a measure of the pressure in your arteries as the heart relaxes. What are the causes? The exact cause of this condition is not known. There are some conditions that result in or are related to high blood pressure. What increases the risk? Some risk factors for high blood pressure are under your control. The following factors may make you more likely to develop this condition:  Smoking.  Having type 2 diabetes mellitus, high cholesterol, or both.  Not getting enough exercise or physical activity.  Being overweight.  Having too much fat, sugar, calories, or salt (sodium) in your diet.  Drinking too much alcohol. Some risk factors for high blood pressure may be difficult or impossible to change. Some of these factors include:  Having chronic kidney disease.  Having a family history of high blood pressure.  Age. Risk increases with age.  Race. You may be at higher risk if you are African American.  Gender. Men are at higher risk than  women before age 96. After age 15, women are at higher risk than men.  Having obstructive sleep apnea.  Stress. What are the signs or symptoms? High blood pressure may not cause symptoms. Very high blood pressure (hypertensive crisis) may cause:  Headache.  Anxiety.  Shortness of breath.  Nosebleed.  Nausea and vomiting.  Vision changes.  Severe chest pain.  Seizures. How is this diagnosed? This condition is diagnosed by measuring your blood pressure while you are seated, with your arm resting on a flat surface, your legs uncrossed, and your feet flat on the floor. The cuff of the blood pressure monitor will be placed directly against the skin of your upper arm at the level of your heart. It should be measured at least twice using the same arm. Certain conditions can cause a difference in blood pressure between your right and left arms. Certain factors can cause blood pressure readings to be lower or higher than normal for a short period of time:  When your blood pressure is higher when you are in a health care provider's office than when you are at home, this is called white coat hypertension. Most people with this condition do not need medicines.  When your blood pressure is higher at home than when you are in a health care provider's office, this is called masked hypertension. Most people with this condition may need medicines to control blood pressure. If you have a high blood pressure reading during one visit or you have normal blood pressure with other risk factors, you may be  asked to:  Return on a different day to have your blood pressure checked again.  Monitor your blood pressure at home for 1 week or longer. If you are diagnosed with hypertension, you may have other blood or imaging tests to help your health care provider understand your overall risk for other conditions. How is this treated? This condition is treated by making healthy lifestyle changes, such as eating  healthy foods, exercising more, and reducing your alcohol intake. Your health care provider may prescribe medicine if lifestyle changes are not enough to get your blood pressure under control, and if:  Your systolic blood pressure is above 130.  Your diastolic blood pressure is above 80. Your personal target blood pressure may vary depending on your medical conditions, your age, and other factors. Follow these instructions at home: Eating and drinking  Eat a diet that is high in fiber and potassium, and low in sodium, added sugar, and fat. An example eating plan is called the DASH (Dietary Approaches to Stop Hypertension) diet. To eat this way: ? Eat plenty of fresh fruits and vegetables. Try to fill one half of your plate at each meal with fruits and vegetables. ? Eat whole grains, such as whole-wheat pasta, brown rice, or whole-grain bread. Fill about one fourth of your plate with whole grains. ? Eat or drink low-fat dairy products, such as skim milk or low-fat yogurt. ? Avoid fatty cuts of meat, processed or cured meats, and poultry with skin. Fill about one fourth of your plate with lean proteins, such as fish, chicken without skin, beans, eggs, or tofu. ? Avoid pre-made and processed foods. These tend to be higher in sodium, added sugar, and fat.  Reduce your daily sodium intake. Most people with hypertension should eat less than 1,500 mg of sodium a day.  Do not drink alcohol if: ? Your health care provider tells you not to drink. ? You are pregnant, may be pregnant, or are planning to become pregnant.  If you drink alcohol: ? Limit how much you use to:  0-1 drink a day for women.  0-2 drinks a day for men. ? Be aware of how much alcohol is in your drink. In the [place] one drink equals one 12 oz bottle of beer (355 mL), one 5 oz glass of wine (148 mL), or one 1 oz glass of hard liquor (44 mL).   Lifestyle  Work with your health care provider to maintain a healthy body weight or  to lose weight. Ask what an ideal weight is for you.  Get at least 30 minutes of exercise most days of the week. Activities may include walking, swimming, or biking.  Include exercise to strengthen your muscles (resistance exercise), such as Pilates or lifting weights, as part of your weekly exercise routine. Try to do these types of exercises for 30 minutes at least 3 days a week.  Do not use any products that contain nicotine or tobacco, such as cigarettes, e-cigarettes, and chewing tobacco. If you need help quitting, ask your health care provider.  Monitor your blood pressure at home as told by your health care provider.  Keep all follow-up visits as told by your health care provider. This is important.   Medicines  Take over-the-counter and prescription medicines only as told by your health care provider. Follow directions carefully. Blood pressure medicines must be taken as prescribed.  Do not skip doses of blood pressure medicine. Doing this puts you at risk for problems and can  make the medicine less effective.  Ask your health care provider about side effects or reactions to medicines that you should watch for. Contact a health care provider if you:  Think you are having a reaction to a medicine you are taking.  Have headaches that keep coming back (recurring).  Feel dizzy.  Have swelling in your ankles.  Have trouble with your vision. Get help right away if you:  Develop a severe headache or confusion.  Have unusual weakness or numbness.  Feel faint.  Have severe pain in your chest or abdomen.  Vomit repeatedly.  Have trouble breathing. Summary  Hypertension is when the force of blood pumping through your arteries is too strong. If this condition is not controlled, it may put you at risk for serious complications.  Your personal target blood pressure may vary depending on your medical conditions, your age, and other factors. For most people, a normal blood  pressure is less than 120/80.  Hypertension is treated with lifestyle changes, medicines, or a combination of both. Lifestyle changes include losing weight, eating a healthy, low-sodium diet, exercising more, and limiting alcohol. This information is not intended to replace advice given to you by your health care provider. Make sure you discuss any questions you have with your health care provider. Document Revised: 04/19/2018 Document Reviewed: 04/19/2018 Elsevier Patient Education  2021 [place], also known as a hordeolum, is a bump that forms on an eyelid. It may look like a pimple next to the eyelash. A stye can form inside the eyelid (internal stye) or outside the eyelid (external stye). A stye can cause redness, swelling, and pain on the eyelid. Styes are very common. Anyone can get them at any age. They usually occur in just one eye, but you may have more than one in either eye. What are the causes? A stye is caused by an infection. The infection is almost always caused by bacteria called Staphylococcus aureus. This is a common type of bacteria that lives on the skin. An internal stye may result from an infected oil-producing gland inside the eyelid. An external stye may be caused by an infection at the base of the eyelash (hair follicle). What increases the risk? You are more likely to develop a stye if:  You have had a stye before.  You have any of these conditions: ? Diabetes. ? Red, itchy, inflamed eyelids (blepharitis). ? A skin condition such as seborrheic dermatitis or rosacea. ? High fat levels in your blood (lipids). What are the signs or symptoms? The most common symptom of a stye is eyelid pain. Internal styes are more painful than external styes. Other symptoms may include:  Painful swelling of your eyelid.  A scratchy feeling in your eye.  Tearing and redness of your eye.  Pus draining from the stye.   How is this diagnosed? Your health care  provider may be able to diagnose a stye just by examining your eye. The health care provider may also check to make sure:  You do not have a fever or other signs of a more serious infection.  The infection has not spread to other parts of your eye or areas around your eye. How is this treated? Most styes will clear up in a few days without treatment or with warm compresses applied to the area. You may need to use antibiotic drops or ointment to treat an infection. In some cases, if your stye does not heal with routine treatment,  your health care provider may drain pus from the stye using a thin blade or needle. This may be done if the stye is large, causing a lot of pain, or affecting your vision. Follow these instructions at home:  Take over-the-counter and prescription medicines only as told by your health care provider. This includes eye drops or ointments.  If you were prescribed an antibiotic medicine, apply or use it as told by your health care provider. Do not stop using the antibiotic even if your condition improves.  Apply a warm, wet cloth (warm compress) to your eye for 5-10 minutes, 4 times a day.  Clean the affected eyelid as directed by your health care provider.  Do not wear contact lenses or eye makeup until your stye has healed.  Do not try to pop or drain the stye.  Do not rub your eye. Contact a health care provider if:  You have chills or a fever.  Your stye does not go away after several days.  Your stye affects your vision.  Your eyeball becomes swollen, red, or painful. Get help right away if:  You have pain when moving your eye around. Summary  A stye is a bump that forms on an eyelid. It may look like a pimple next to the eyelash.  A stye can form inside the eyelid (internal stye) or outside the eyelid (external stye). A stye can cause redness, swelling, and pain on the eyelid.  Your health care provider may be able to diagnose a stye just by  examining your eye.  Apply a warm, wet cloth (warm compress) to your eye for 5-10 minutes, 4 times a day. This information is not intended to replace advice given to you by your health care provider. Make sure you discuss any questions you have with your health care provider. Document Revised: 04/17/2020 Document Reviewed: 04/17/2020 Elsevier Patient Education  [place].

## 2020-12-30 ENCOUNTER — Other Ambulatory Visit: Payer: Self-pay

## 2020-12-30 ENCOUNTER — Ambulatory Visit: Payer: Managed Care, Other (non HMO) | Admitting: Physician Assistant

## 2020-12-30 ENCOUNTER — Encounter: Payer: Self-pay | Admitting: Physician Assistant

## 2020-12-30 ENCOUNTER — Ambulatory Visit (INDEPENDENT_AMBULATORY_CARE_PROVIDER_SITE_OTHER): Payer: Managed Care, Other (non HMO) | Admitting: Physician Assistant

## 2020-12-30 VITALS — BP 130/74 | HR 64 | Temp 98.0°F | Resp 18 | Ht 67.0 in | Wt 238.0 lb

## 2020-12-30 DIAGNOSIS — D494 Neoplasm of unspecified behavior of bladder: Secondary | ICD-10-CM

## 2020-12-30 DIAGNOSIS — Z008 Encounter for other general examination: Secondary | ICD-10-CM

## 2020-12-30 DIAGNOSIS — Z0000 Encounter for general adult medical examination without abnormal findings: Secondary | ICD-10-CM

## 2020-12-30 DIAGNOSIS — C679 Malignant neoplasm of bladder, unspecified: Secondary | ICD-10-CM | POA: Diagnosis not present

## 2020-12-30 MED ORDER — BCG LIVE 50 MG IS SUSR
3.2400 mL | Freq: Once | INTRAVESICAL | Status: AC
  Administered 2020-12-30: 81 mg via INTRAVESICAL

## 2020-12-30 NOTE — Progress Notes (Signed)
BCG Bladder Instillation  BCG # 2 of 3  Due to Bladder Cancer patient is present today for a BCG treatment. Patient was cleaned and prepped in a sterile fashion with betadine. A 14FR catheter was inserted, urine return was noted 47ml, urine was yellow in color.  59ml of reconstituted BCG was instilled into the bladder. The catheter was then removed. Patient tolerated well, no complications were noted  Performed by: Debroah Loop, PA-C and Bradly Bienenstock, CMA  Follow up/ Additional notes: 1 week for BCG #3 of 3

## 2020-12-30 NOTE — Progress Notes (Signed)
Subjective:    Patient ID: Christina Holloway, female    DOB: 10/02/1960, 60 y.o.   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 was seen last week with a right eye hordeolum and was  treated with  erythromycin eye ointment and Ketorolac drops. Feels much improved. Now comfortable, lesion resolved per patient.  Generally feels well, doing well.  Being treated for bladder cancer by [facility] and is pleased to have  Completed all but the last 2 doses of  her therapy.  Has ongoing issues with being overweight and recognizes that she Loves to eat and loves salt.  Reviewed fresh and frozen; avoid bottles and cans, No added salt- DASH diet for help with serving sizes and food choices  Review of Systems  eye comfortable - hordeolum has resolved  having some low back midline/bilateral discomfort- longstanding Denies bladder pain Notes SOB with rapid ambulation- needs to discuss with Urology at visit following this one.  Discussed deconditioning and Christina Holloway felt it sounded c/w     Objective:   Physical Exam Vitals and nursing note reviewed.  Constitutional:      General: She is not in acute distress.    Appearance: She is obese.  HENT:     Head: Normocephalic and atraumatic.     Right Ear: Tympanic membrane, ear canal and external ear normal.     Left Ear: Tympanic membrane, ear canal and external ear normal.     Ears:     Comments: Q-tips discussed    Nose: Nose normal.     Mouth/Throat:     Mouth: Mucous membranes are moist.     Comments: DDS q 6 months-- currently under care for partials Father had "bad teeth" and she feels she inherited his. Color changes have always been there Good care encouraged and f/u with DDS or specialists as recommended Eyes:     Extraocular Movements: Extraocular movements intact.     Pupils: Pupils are equal, round, and reactive to light.     Comments: Mildly injected  conjunctiva bilaterally, generalized Right hordeolum resolved  Cardiovascular:     Rate and Rhythm: Normal rate and regular rhythm.     Pulses: Normal pulses.  Pulmonary:     Effort: Pulmonary effort is normal.     Breath sounds: Normal breath sounds.     Comments: Shallow breathing unless coached-- reviewed Encourage deep inhale exchanges regualrly Abdominal:     Tenderness: There is no abdominal tenderness. There is no right CVA tenderness or left CVA tenderness.     Comments: Rotund,soft , nontender  Genitourinary:    Comments: defer Musculoskeletal:        General: Normal range of motion.     Cervical back: Normal range of motion and neck supple.  Skin:    General: Skin is warm and dry.     Capillary Refill: Capillary refill takes less than 2 seconds.  Neurological:     General: No focal deficit present.     Mental Status: She is alert.     Cranial Nerves: No cranial nerve deficit.     Deep Tendon Reflexes: Reflexes normal.  Psychiatric:        Mood and Affect: Mood normal.        Behavior: Behavior normal.       Assessment & Plan:   Labs to be reported as available- MyChart reported as active Reviewed  dietary guidelines and references for review Push back - no seconds- healthy food choices and  portion control DASH diet as reference for choices and serving sizes Walk as given clearance by Urology and PCP

## 2020-12-30 NOTE — Patient Instructions (Signed)

## 2020-12-30 NOTE — Patient Instructions (Signed)

## 2020-12-31 LAB — LIPID PANEL
Chol/HDL Ratio: 6.2 ratio — ABNORMAL HIGH (ref 0.0–4.4)
Cholesterol, Total: 243 mg/dL — ABNORMAL HIGH (ref 100–199)
HDL: 39 mg/dL — ABNORMAL LOW (ref >39)
LDL Chol Calc (NIH): 175 mg/dL — ABNORMAL HIGH (ref 0–99)
Triglycerides: 156 mg/dL — ABNORMAL HIGH (ref 0–149)
VLDL Cholesterol Cal: 29 mg/dL (ref 5–40)

## 2020-12-31 LAB — URINALYSIS, COMPLETE
Bilirubin, UA: NEGATIVE
Glucose, UA: NEGATIVE
Ketones, UA: NEGATIVE
Leukocytes,UA: NEGATIVE
Nitrite, UA: NEGATIVE
Protein,UA: NEGATIVE
Specific Gravity, UA: >1.03 — ABNORMAL HIGH (ref 1.005–1.030)
Urobilinogen, Ur: 0.2 mg/dL (ref 0.2–1.0)
pH, UA: 5.5 (ref 5.0–7.5)

## 2020-12-31 LAB — MICROSCOPIC EXAMINATION

## 2020-12-31 LAB — GLUCOSE, RANDOM: Glucose: 77 mg/dL (ref 65–99)

## 2021-01-02 ENCOUNTER — Telehealth: Payer: Self-pay

## 2021-01-02 NOTE — Telephone Encounter (Signed)
Copied from [place] [id number]. Topic: General - Other >> Jan 02, 2021 12:48 PM [name] wrote: Reason for CRM: Pt called stating that she believes she may need some vitamin D as well as her cholesterol was high at 243 Please advise.

## 2021-01-05 ENCOUNTER — Other Ambulatory Visit: Payer: Self-pay

## 2021-01-05 ENCOUNTER — Ambulatory Visit (INDEPENDENT_AMBULATORY_CARE_PROVIDER_SITE_OTHER): Payer: Managed Care, Other (non HMO) | Admitting: Physician Assistant

## 2021-01-05 DIAGNOSIS — C674 Malignant neoplasm of posterior wall of bladder: Secondary | ICD-10-CM | POA: Diagnosis not present

## 2021-01-05 MED ORDER — BCG LIVE 50 MG IS SUSR
3.2400 mL | Freq: Once | INTRAVESICAL | Status: AC
  Administered 2021-01-05: 81 mg via INTRAVESICAL

## 2021-01-05 MED ORDER — ROSUVASTATIN CALCIUM 5 MG PO TABS: 5.0000 mg | ORAL_TABLET | Freq: Every day | ORAL | 3 refills | Status: DC

## 2021-01-05 NOTE — Telephone Encounter (Signed)
Pt. Given results and instructions. Requests Crestor be sent to her pharmacy.

## 2021-01-05 NOTE — Progress Notes (Signed)
BCG Bladder Instillation  BCG # 3 of 3  Due to Bladder Cancer patient is present today for a BCG treatment. Patient was cleaned and prepped in a sterile fashion with betadine. A 14FR catheter was inserted, urine return was noted 4ml, urine was yellow in color.  23ml of reconstituted BCG was instilled into the bladder. The catheter was then removed. Patient tolerated well, no complications were noted  Performed by: Debroah Loop, PA-C and Bradly Bienenstock, CMA  Follow up/ Additional notes: cysto with Dr. Diamantina Providence

## 2021-01-05 NOTE — Telephone Encounter (Signed)
The 10-year ASCVD risk score Mikey Bussing DC Brooke Bonito., et al., 2013) is: 9.6% -her 10-year risk of heart disease and stroke based on her most recent cholesterol reading is high at 9.6%.  I would recommend a statin to lower this risk.  If patient is agreeable, can start Crestor 5 mg daily #90 with 3 refills I would recommend follow-up in about 3 months to recheck her levels and see how she is doing on Crestor.  She can take co-Q10 100 mg daily OTC to avoid myalgias with the Crestor.  Her cholesterol was checked in her biometric screening and I can see in the chart, but I do not see a vitamin D level.  That being said, it would be unlikely to hurt her if she started taking OTC vitamin D3 1000 mg daily.

## 2021-01-05 NOTE — Telephone Encounter (Signed)
lmtcb okay for PEC triage to advise .KW 

## 2021-01-06 ENCOUNTER — Telehealth: Payer: Self-pay | Admitting: Physician Assistant

## 2021-01-06 NOTE — Telephone Encounter (Signed)
-----   Message from Debroah Loop, [place] sent at 01/05/2021  5:37 PM EDT ----- Regarding: FW: Cysto scheduling Please cancel both her scheduled cystos next month, schedule a new one for 3 months out from now, and notify the patient of these changes. ----- Message ----- From: Billey Co, MD Sent: 01/05/2021   3:42 PM EDT To: Debroah Loop, PA-C Subject: RE: Cysto scheduling                           Standard 33mo is fine ----- Message ----- From: Debroah Loop, PA-C Sent: 01/05/2021   3:42 PM EDT To: Billey Co, MD Subject: Cysto scheduling                               Hey, I just completed mBCG x3 on this patient. Somehow she got scheduled for 2 separate cystos with you, 2 weeks apart, next month. Did you want to scope her so soon, or should I push her out to the standard 3 month timeframe?

## 2021-01-06 NOTE — Telephone Encounter (Signed)
APPTS CX AND RESCHD LM FOR PT TO CB TO CONFIRM  Christina Holloway

## 2021-01-08 LAB — URINALYSIS, COMPLETE
Bilirubin, UA: NEGATIVE
Glucose, UA: NEGATIVE
Leukocytes,UA: NEGATIVE
Nitrite, UA: NEGATIVE
Protein,UA: NEGATIVE
Specific Gravity, UA: 1.015 (ref 1.005–1.030)
Urobilinogen, Ur: 1 mg/dL (ref 0.2–1.0)
pH, UA: 5.5 (ref 5.0–7.5)

## 2021-01-08 LAB — MICROSCOPIC EXAMINATION

## 2021-01-12 ENCOUNTER — Ambulatory Visit: Payer: Self-pay

## 2021-01-12 NOTE — Telephone Encounter (Signed)
Pt. Reports she started taking Crestor and calcium last Thursday. Saturday noticed both feet and ankles were swollen. Is able to wear her shoes. No pain or any other symptom. Please advise pt.   Answer Assessment - Initial Assessment Questions 1. LOCATION: "Which ankle is swollen?" "Where is the swelling?"     Both 2. ONSET: "When did the swelling start?"     Saturday 3. SIZE: "How large is the swelling?"     Can wear shoes 4. PAIN: "Is there any pain?" If Yes, ask: "How bad is it?" (Scale 1-10; or mild, moderate, severe)   - NONE (0): no pain.   - MILD (1-3): doesn't interfere with normal activities.    - MODERATE (4-7): interferes with normal activities (e.g., work or school) or awakens from sleep, limping.    - SEVERE (8-10): excruciating pain, unable to do any normal activities, unable to walk.      No 5. CAUSE: "What do you think caused the ankle swelling?"     Swelling 6. OTHER SYMPTOMS: "Do you have any other symptoms?" (e.g., fever, chest pain, difficulty breathing, calf pain)     No 7. PREGNANCY: "Is there any chance you are pregnant?" "When was your last menstrual period?"     No  Protocols used: ANKLE SWELLING-A-AH

## 2021-01-13 NOTE — Telephone Encounter (Signed)
Patient advised. Patient reports swelling is better today. Patient reports she did fall a few days ago at work, and hit her left leg. Patient denies any pain, shortness of breath or redness. Patient reports she will call back if swelling or pain is worsening.

## 2021-01-13 NOTE — Telephone Encounter (Signed)
Neither Crestor nor calcium should cause leg swelling.  If 1 leg is significantly more swollen than the other, recommend evaluation for possible DVT.  If it is bilateral and symmetric, recommend elevation, compression socks, and decrease sodium in her diet.

## 2021-01-13 NOTE — Telephone Encounter (Signed)
Noted  

## 2021-01-15 ENCOUNTER — Other Ambulatory Visit: Payer: Self-pay | Admitting: Family Medicine

## 2021-01-15 NOTE — Telephone Encounter (Signed)
Requested medication (s) are due for refill today: yes  Requested medication (s) are on the active medication list: Losartan & HCTZ  Last refill:  09/05/20  Future visit scheduled: yes  Notes to clinic: historical provider   Requested Prescriptions  Pending Prescriptions Disp Refills   losartan (COZAAR) 100 MG tablet [Pharmacy Med Name: LOSARTAN POTASSIUM 100 MG TAB] 90 tablet     Sig: TAKE 1 TABLET BY MOUTH DAILY      Cardiovascular:  Angiotensin Receptor Blockers Failed - 01/15/2021 11:50 AM      Failed - Cr in normal range and within 180 days    Creatinine  Date Value Ref Range Status  11/27/2014 0.75 mg/dL Final    Comment:    0.44-1.00 NOTE: New Reference Range  10/29/14    Creatinine, Ser  Date Value Ref Range Status  02/15/2020 0.94 0.57 - 1.00 mg/dL Final          Failed - K in normal range and within 180 days    Potassium  Date Value Ref Range Status  02/15/2020 4.1 3.5 - 5.2 mmol/L Final  11/27/2014 3.0 (L) mmol/L Final    Comment:    3.5-5.1 NOTE: New Reference Range  10/29/14           Failed - Valid encounter within last 6 months    Recent Outpatient Visits           7 months ago Essential (primary) hypertension   [facility], Dionne Bucy, MD   7 months ago [place] [facility], Dionne Bucy, MD   11 months ago Essential (primary) hypertension   [facility], Dionne Bucy, MD   1 year ago Essential (primary) hypertension   [facility], Dionne Bucy, MD   1 year ago Essential hypertension   [place], Clearnce Sorrel, [place]       Future Appointments             In 3 months [facility], Dionne Bucy, MD [facility], [place] - Patient is not pregnant      Passed - Last BP in normal range    BP Readings from Last 1 Encounters:  12/30/20 130/74            hydrochlorothiazide (HYDRODIURIL) 25 MG  tablet [Pharmacy Med Name: HYDROCHLOROTHIAZIDE 25 MG TAB] 90 tablet     Sig: TAKE 1 TABLET BY MOUTH DAILY      Cardiovascular: Diuretics - Thiazide Failed - 01/15/2021 11:50 AM      Failed - Ca in normal range and within 360 days    Calcium  Date Value Ref Range Status  02/15/2020 10.4 (H) 8.7 - 10.2 mg/dL Final   Calcium, Total  Date Value Ref Range Status  11/27/2014 9.5 mg/dL Final    Comment:    8.9-10.3 NOTE: New Reference Range  10/29/14    Calcium, Ion  Date Value Ref Range Status  08/10/2019 1.24 1.15 - 1.40 mmol/L Final          Failed - Valid encounter within last 6 months    Recent Outpatient Visits           7 months ago Essential (primary) hypertension   [facility], Dionne Bucy, MD   7 months ago [place], Dionne Bucy, MD   11 months ago Essential (primary)  hypertension   [facility], Dionne Bucy, MD   1 year ago Essential (primary) hypertension   [facility], Dionne Bucy, MD   1 year ago Essential hypertension   [place], Clearnce Sorrel, [place]       Future Appointments             In 3 months Bacigal[facility], Dionne Bucy, MD [facility]             Passed - Cr in normal range and within 360 days    Creatinine  Date Value Ref Range Status  11/27/2014 0.75 mg/dL Final    Comment:    0.44-1.00 NOTE: New Reference Range  10/29/14    Creatinine, Ser  Date Value Ref Range Status  02/15/2020 0.94 0.57 - 1.00 mg/dL Final          Passed - K in normal range and within 360 days    Potassium  Date Value Ref Range Status  02/15/2020 4.1 3.5 - 5.2 mmol/L Final  11/27/2014 3.0 (L) mmol/L Final    Comment:    3.5-5.1 NOTE: New Reference Range  10/29/14           Passed - Na in normal range and within 360 days    Sodium  Date Value Ref Range Status  02/15/2020 140 134 - 144 mmol/L Final   11/27/2014 142 mmol/L Final    Comment:    135-145 NOTE: New Reference Range  10/29/14           Passed - Last BP in normal range    BP Readings from Last 1 Encounters:  12/30/20 130/74

## 2021-02-03 ENCOUNTER — Other Ambulatory Visit: Payer: Self-pay | Admitting: Urology

## 2021-02-04 ENCOUNTER — Other Ambulatory Visit: Payer: Self-pay | Admitting: Urology

## 2021-02-17 ENCOUNTER — Other Ambulatory Visit: Payer: Self-pay | Admitting: Urology

## 2021-03-06 ENCOUNTER — Telehealth: Payer: Self-pay

## 2021-03-06 NOTE — Telephone Encounter (Signed)
Copied from [place] [id number]. Topic: General - Other >> Mar 06, 2021 10:02 AM Yvette Rack wrote: Reason for CRM: Pt stated the gout medication is not working this time and she would like to request that another medication be sent to [place], [place]

## 2021-03-09 NOTE — Telephone Encounter (Signed)
How is she doing now?  Still having gout symptoms?  Colchicine not working? If so, ok to Rx prednisone 6d taper (60,50,40,30,20,10)

## 2021-03-09 NOTE — Telephone Encounter (Signed)
Patient is doing much better 

## 2021-03-09 NOTE — Telephone Encounter (Signed)
Pt has called back office at lunch, pt is requesting cb [phone]

## 2021-03-09 NOTE — Telephone Encounter (Signed)
lmtcb

## 2021-04-08 ENCOUNTER — Other Ambulatory Visit: Payer: Self-pay

## 2021-04-08 ENCOUNTER — Ambulatory Visit (INDEPENDENT_AMBULATORY_CARE_PROVIDER_SITE_OTHER): Payer: Managed Care, Other (non HMO) | Admitting: Urology

## 2021-04-08 VITALS — Ht 67.0 in | Wt 238.0 lb

## 2021-04-08 DIAGNOSIS — Z8551 Personal history of malignant neoplasm of bladder: Secondary | ICD-10-CM

## 2021-04-08 DIAGNOSIS — Z08 Encounter for follow-up examination after completed treatment for malignant neoplasm: Secondary | ICD-10-CM

## 2021-04-08 DIAGNOSIS — C674 Malignant neoplasm of posterior wall of bladder: Secondary | ICD-10-CM

## 2021-04-08 NOTE — Progress Notes (Signed)
Bladder cancer surveillance note   UROLOGIC HISTORY Christina Holloway is a 60 y.o. 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  She underwent TURBT on 08/10/2019 with pathology showing high-grade T1 with no muscle invasion, and repeat TURBT on 09/14/2019 showed no evidence of malignancy.  She completed induction BCG in April 2021.   She is a never smoker.   Initial Diagnosis of Bladder  Year: 07/2019 Pathology: High-grade T1, muscle present and not involved   Recurrent Bladder Cancer Diagnosis None   Treatments for Bladder Cancer 08/10/2019: Initial TURBT 3 cm, HG T1 09/14/2019: 2nd look TURBT, pathology negative 3-11/2019: Induction BCG x6 03/2020: mBCG #1 06/2020: mBCG #2 12/2020: mBCG #3    AUA Risk Category High   Cystoscopy Procedure Note: After informed consent and discussion of the procedure and its risks, [name] was positioned and prepped in the standard fashion. Cystoscopy was performed with the a flexible cystoscope. The urethra, bladder neck and entire bladder was visualized in a standard fashion, and mucosa was normal throughout.  Scar right lateral wall with no other abnormalities.  The ureteral orifices were visualized in their normal location and orientation.  No abnormalities on retroflexion. Cytology sent.   If cytology negative, next mBCG due 06/2021, cystoscopy to follow, CT urogram ~07/2021  Nickolas Madrid, MD 04/08/2021

## 2021-04-09 LAB — URINALYSIS, COMPLETE
Bilirubin, UA: NEGATIVE
Glucose, UA: NEGATIVE
Ketones, UA: NEGATIVE
Leukocytes,UA: NEGATIVE
Nitrite, UA: NEGATIVE
Protein,UA: NEGATIVE
Specific Gravity, UA: 1.025 (ref 1.005–1.030)
Urobilinogen, Ur: 1 mg/dL (ref 0.2–1.0)
pH, UA: 6 (ref 5.0–7.5)

## 2021-04-09 LAB — MICROSCOPIC EXAMINATION

## 2021-04-10 LAB — CYTOLOGY - NON PAP

## 2021-04-10 NOTE — Telephone Encounter (Signed)
Some patients will always have a small amount of microscopic blood in the urine that leaks through the kidneys, and this is not uncommon.  Also patient to get BCG treatments because of some immune response within the bladder that can cause that low amount of microscopic blood.   We will follow-up cytology and call with plan moving forward, but everything looks good on cystoscopy, history  Christina Madrid, MD 04/10/2021

## 2021-04-15 ENCOUNTER — Ambulatory Visit
Admission: RE | Admit: 2021-04-15 | Discharge: 2021-04-15 | Disposition: A | Discharge status: Home / Self Care | Payer: Managed Care, Other (non HMO) | Source: Ambulatory Visit | Attending: Family Medicine | Admitting: Family Medicine

## 2021-04-15 ENCOUNTER — Other Ambulatory Visit: Payer: Self-pay

## 2021-04-15 DIAGNOSIS — Z1231 Encounter for screening mammogram for malignant neoplasm of breast: Secondary | ICD-10-CM | POA: Diagnosis not present

## 2021-04-17 ENCOUNTER — Telehealth: Payer: Self-pay

## 2021-04-17 NOTE — Telephone Encounter (Signed)
Detailed message notifying message left on voice mail per DPR. Appointments made, my chart notification also sent

## 2021-04-17 NOTE — Telephone Encounter (Signed)
-----   Message from Billey Co, MD sent at 04/14/2021  8:21 AM EDT ----- Good news, no worrisome cells on urine cytology.  Please schedule 3 sessions of maintenance BCG in November 2022, with cystoscopy end of December  Nickolas Madrid, MD 04/14/2021

## 2021-04-30 ENCOUNTER — Encounter: Payer: Managed Care, Other (non HMO) | Admitting: Family Medicine

## 2021-05-01 NOTE — Progress Notes (Signed)
Complete physical exam   Patient: Christina Holloway   DOB: 03-31-1961   60 y.o. Female  MRN: [id number] Visit Date: 05/04/2021  Today's healthcare provider: Wilhemena Durie, MD   Chief Complaint  Patient presents with   Annual Exam   Subjective    Christina Holloway is a 60 y.o. female who presents today for a complete physical exam.  She is followed by Dr. Brita Romp. She reports consuming a general diet. The patient does not participate in regular exercise at present. She generally feels well. She reports sleeping well. She does not have additional problems to discuss today.  She has had 2 COVID vaccines and had COVID a year ago.  She had hysterectomy for fibroids in 2009. She does complain of chronic dyspnea on exertion over the past few years.  No acute changes but just a slow decline.  No chest pain PND orthopnea.  Past Medical History:  Diagnosis Date   Abnormal LFTs 05/02/2015   Anemia, iron deficiency 08/24/2003   in past   Arthritis    lower back, legs   Calcium blood increased 05/02/2015   Cancer ([place])    Decreased potassium in the blood 05/02/2015   Essential (primary) hypertension 08/24/2003   Gout 05/02/2015   Hypercholesterolemia without hypertriglyceridemia 04/29/2009   Malaise and fatigue 05/02/2015   Shortness of breath dyspnea    1 flight stairs   Wears dentures    full upper, partial lower   Past Surgical History:  Procedure Laterality Date   COLONOSCOPY WITH PROPOFOL N/A 05/08/2015   Procedure: COLONOSCOPY WITH PROPOFOL;  Surgeon: Lucilla Lame, MD;  Location: [facility];  Service: Endoscopy;  Laterality: N/A;   COLONOSCOPY WITH PROPOFOL N/A 05/30/2018   Procedure: COLONOSCOPY WITH PROPOFOL;  Surgeon: Lin Landsman, MD;  [facility];  Service: Gastroenterology;  Laterality: N/A;   CYSTOSCOPY WITH FULGERATION  09/14/2019   Procedure: CYSTOSCOPY WITH FULGERATION;  Surgeon: Billey Co, MD;  Location: [facility] ORS;  Service: Urology;;   ENDOMETRIAL  ABLATION     POLYPECTOMY  05/08/2015   Procedure: POLYPECTOMY;  Surgeon: Lucilla Lame, MD;  Location: [facility];  Service: Endoscopy;;   TRANSURETHRAL RESECTION OF BLADDER TUMOR WITH MITOMYCIN-C N/A 08/10/2019   Procedure: TRANSURETHRAL RESECTION OF BLADDER TUMOR WITH Gemcitabine;  Surgeon: Billey Co, MD;  Location: [facility] OR[facility];  Service: Urology;  Laterality: N/A;   VAGINAL HYSTERECTOMY     Social History   Socioeconomic History   Marital status: Single    Spouse name: Not on file   Number of children: Not on file   Years of education: Not on file   Highest education level: Not on file  Occupational History   Not on file  Tobacco Use   Smoking status: Never   Smokeless tobacco: Never  Substance and Sexual Activity   Alcohol use: Yes    Alcohol/week: 36.0 standard drinks    Types: 36 Cans of beer per week   Drug use: No   Sexual activity: Yes  Other Topics Concern   Not on file  Social History Narrative   Not on file   Social Determinants of Health   Financial Resource Strain: Not on file  Food Insecurity: Not on file  Transportation Needs: Not on file  Physical Activity: Not on file  Stress: Not on file  Social Connections: Not on file  Intimate Partner Violence: Not on file   Family Status  Relation Name Status   Mother  Alive   Father  Deceased   Sister  Alive   Sister  Deceased   PGM  (Not Specified)   Cousin  (Not Specified)   Family History  Problem Relation Age of Onset   Hypertension Mother    Thyroid disease Mother    Prostate cancer Father    AAA (abdominal aortic aneurysm) Sister    Breast cancer Paternal Grandmother 93   Breast cancer Cousin 52   Allergies  Allergen Reactions   Penicillins Rash    Did it involve swelling of the face/tongue/throat, SOB, or low BP? No Did it involve sudden or severe rash/hives, skin peeling, or any reaction on the inside of your mouth or nose? No Did you need to seek medical attention at a hospital  or doctor's office? No When did it last happen?     childhood allergy  If all above answers are "NO", may proceed with cephalosporin use.     Patient Care Team: Virginia Crews, MD as PCP - General (Family Medicine)   Medications: Outpatient Medications Prior to Visit  Medication Sig   amLODipine (NORVASC) 10 MG tablet Take 1 tablet (10 mg total) by mouth daily.   colchicine 0.6 MG tablet Take 1.'2mg'$  PO x1 at first sign of gout flare, then 0.'6mg'$  1 hour later. Continue 0.'6mg'$  BID until 48 hours after resolution of flare.   docusate sodium (COLACE) 100 MG capsule Take 1 capsule (100 mg total) by mouth daily.   erythromycin ophthalmic ointment Place 1 application into the right eye 3 (three) times daily as needed. Use the eye ointment for one week.   hydrochlorothiazide (HYDRODIURIL) 25 MG tablet TAKE 1 TABLET BY MOUTH DAILY   ketorolac (ACULAR) 0.5 % ophthalmic solution Place 1 drop into the right eye 3 (three) times daily as needed.   losartan (COZAAR) 100 MG tablet TAKE 1 TABLET BY MOUTH DAILY   rosuvastatin (CRESTOR) 5 MG tablet Take 1 tablet (5 mg total) by mouth daily.   No facility-administered medications prior to visit.    Review of Systems  All other systems reviewed and are negative. ECG reveals sinus rhythm at 60 with very low voltage precordial leads.    Objective    BP 132/72   Pulse 69   Temp 98.2 F (36.8 C)   Resp 16   Ht 5' 7.5" (1.715 m)   Wt 242 lb (109.8 kg)   BMI 37.34 kg/m  BP Readings from Last 3 Encounters:  05/04/21 132/72  12/30/20 130/74  12/26/20 (!) 164/94   Wt Readings from Last 3 Encounters:  05/04/21 242 lb (109.8 kg)  04/08/21 238 lb (108 kg)  12/30/20 238 lb (108 kg)      Physical Exam Vitals reviewed.  Constitutional:      General: She is not in acute distress.    Appearance: Normal appearance. She is well-developed. She is not diaphoretic.  HENT:     Head: Normocephalic and atraumatic.  Eyes:     General: No scleral  icterus.    Conjunctiva/sclera: Conjunctivae normal.  Neck:     Thyroid: No thyromegaly.  Cardiovascular:     Rate and Rhythm: Normal rate and regular rhythm.     Pulses: Normal pulses.     Heart sounds: Normal heart sounds. No murmur heard. Pulmonary:     Effort: Pulmonary effort is normal. No respiratory distress.     Breath sounds: Normal breath sounds. No wheezing, rhonchi or rales.  Musculoskeletal:     Cervical back:  Neck supple.     Right lower leg: No edema.     Left lower leg: No edema.  Lymphadenopathy:     Cervical: No cervical adenopathy.  Skin:    General: Skin is warm and dry.     Findings: No rash.  Neurological:     Mental Status: She is alert and oriented to person, place, and time. Mental status is at baseline.  Psychiatric:        Mood and Affect: Mood normal.        Behavior: Behavior normal.        Thought Content: Thought content normal.        Judgment: Judgment normal.      Last depression screening scores PHQ 2/9 Scores 05/04/2021 06/12/2020 02/15/2020  PHQ - 2 Score 0 0 0  PHQ- 9 Score - 3 -   Last fall risk screening Fall Risk  05/04/2021  Falls in the past year? 0  Number falls in past yr: 0  Injury with Fall? 0  Risk for fall due to : No Fall Risks  Follow up Falls evaluation completed   Last Audit-C alcohol use screening Alcohol Use Disorder Test (AUDIT) 05/04/2021  1. How often do you have a drink containing alcohol? 3  2. How many drinks containing alcohol do you have on a typical day when you are drinking? 1  3. How often do you have six or more drinks on one occasion? 1  AUDIT-C Score 5  4. How often during the last year have you found that you were not able to stop drinking once you had started? -  5. How often during the last year have you failed to do what was normally expected from you because of drinking? -  6. How often during the last year have you needed a first drink in the morning to get yourself going after a heavy drinking  session? -  7. How often during the last year have you had a feeling of guilt of remorse after drinking? -  8. How often during the last year have you been unable to remember what happened the night before because you had been drinking? -  9. Have you or someone else been injured as a result of your drinking? -  10. Has a relative or friend or a doctor or another health worker been concerned about your drinking or suggested you cut down? -  Alcohol Use Disorder Identification Test Final Score (AUDIT) -  Alcohol Brief Interventions/Follow-up -   A score of 3 or more in women, and 4 or more in men indicates increased risk for alcohol abuse, EXCEPT if all of the points are from question 1   No results found for any visits on 05/04/21.  Assessment & Plan    Routine Health Maintenance and Physical Exam  Exercise Activities and Dietary recommendations  Goals   None     Immunization History  Administered Date(s) Administered   Influenza Split 05/15/2010   PFIZER(Purple Top)SARS-COV-2 Vaccination 10/12/2019, 11/05/2019   PPD Test 01/05/2017   Tdap 01/28/2020    Health Maintenance  Topic Date Due   Pneumococcal Vaccine 32-59 Years old (1 - PCV) Never done   Zoster Vaccines- Shingrix (1 of 2) Never done   COVID-19 Vaccine (3 - Pfizer risk series) 12/03/2019   INFLUENZA VACCINE  03/23/2021   MAMMOGRAM  04/16/2023   COLONOSCOPY (Pts 45-69yr Insurance coverage will need to be confirmed)  05/31/2023   TETANUS/TDAP  01/27/2030  Hepatitis C Screening  Completed   HIV Screening  Completed   HPV VACCINES  Aged Out    Discussed health benefits of physical activity, and encouraged her to engage in regular exercise appropriate for her age and condition.  1. Annual physical exam Patient up-to-date  2. Shortness of breath Dyspnea on exertion the patient do not know at all. 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 - EKG 12-Lead - Ambulatory referral to  Cardiology  3. EKG abnormalities  - Ambulatory referral to Cardiology  4. Hypercholesterolemia without hypertriglyceridemia On rosuvastatin - Lipid panel - TSH  5. Essential (primary) hypertension Good  blood pressure today - CBC with Differential/Platelet - Comprehensive metabolic panel   No follow-ups on file.     I, Wilhemena Durie, MD, have reviewed all documentation for this visit. The documentation on 05/07/21 for the exam, diagnosis, procedures, and orders are all accurate and complete.    Arelie Kuzel Cranford Mon, MD  [facility] [phone] (phone) [phone] (fax)  [place]

## 2021-05-04 ENCOUNTER — Ambulatory Visit (INDEPENDENT_AMBULATORY_CARE_PROVIDER_SITE_OTHER): Payer: Managed Care, Other (non HMO) | Admitting: Family Medicine

## 2021-05-04 ENCOUNTER — Encounter: Payer: Self-pay | Admitting: Family Medicine

## 2021-05-04 ENCOUNTER — Other Ambulatory Visit: Payer: Self-pay

## 2021-05-04 VITALS — BP 132/72 | HR 69 | Temp 98.2°F | Resp 16 | Ht 67.5 in | Wt 242.0 lb

## 2021-05-04 DIAGNOSIS — R0602 Shortness of breath: Secondary | ICD-10-CM

## 2021-05-04 DIAGNOSIS — R9431 Abnormal electrocardiogram [ECG] [EKG]: Secondary | ICD-10-CM

## 2021-05-04 DIAGNOSIS — E7800 Pure hypercholesterolemia, unspecified: Secondary | ICD-10-CM | POA: Diagnosis not present

## 2021-05-04 DIAGNOSIS — Z0000 Encounter for general adult medical examination without abnormal findings: Secondary | ICD-10-CM | POA: Diagnosis not present

## 2021-05-04 DIAGNOSIS — I10 Essential (primary) hypertension: Secondary | ICD-10-CM

## 2021-05-05 LAB — CBC WITH DIFFERENTIAL/PLATELET
Basophils Absolute: 0.1 10*3/uL (ref 0.0–0.2)
Basos: 1 %
EOS (ABSOLUTE): 0.2 10*3/uL (ref 0.0–0.4)
Eos: 3 %
Hematocrit: 37.7 % (ref 34.0–46.6)
Hemoglobin: 12.8 g/dL (ref 11.1–15.9)
Immature Grans (Abs): 0 10*3/uL (ref 0.0–0.1)
Immature Granulocytes: 0 %
Lymphocytes Absolute: 2 10*3/uL (ref 0.7–3.1)
Lymphs: 33 %
MCH: 30.2 pg (ref 26.6–33.0)
MCHC: 34 g/dL (ref 31.5–35.7)
MCV: 89 fL (ref 79–97)
Monocytes Absolute: 0.5 10*3/uL (ref 0.1–0.9)
Monocytes: 8 %
Neutrophils Absolute: 3.2 10*3/uL (ref 1.4–7.0)
Neutrophils: 55 %
Platelets: 330 10*3/uL (ref 150–450)
RBC: 4.24 x10E6/uL (ref 3.77–5.28)
RDW: 12.6 % (ref 11.7–15.4)
WBC: 5.9 10*3/uL (ref 3.4–10.8)

## 2021-05-05 LAB — COMPREHENSIVE METABOLIC PANEL
ALT: 37 IU/L — ABNORMAL HIGH (ref 0–32)
AST: 21 IU/L (ref 0–40)
Albumin/Globulin Ratio: 2.1 (ref 1.2–2.2)
Albumin: 4.7 g/dL (ref 3.8–4.9)
Alkaline Phosphatase: 68 IU/L (ref 44–121)
BUN/Creatinine Ratio: 15 (ref 9–23)
BUN: 14 mg/dL (ref 6–24)
Bilirubin Total: 0.3 mg/dL (ref 0.0–1.2)
CO2: 23 mmol/L (ref 20–29)
Calcium: 10 mg/dL (ref 8.7–10.2)
Chloride: 101 mmol/L (ref 96–106)
Creatinine, Ser: 0.92 mg/dL (ref 0.57–1.00)
Globulin, Total: 2.2 g/dL (ref 1.5–4.5)
Glucose: 95 mg/dL (ref 65–99)
Potassium: 4.5 mmol/L (ref 3.5–5.2)
Sodium: 142 mmol/L (ref 134–144)
Total Protein: 6.9 g/dL (ref 6.0–8.5)
eGFR: 72 mL/min/{1.73_m2} (ref >59)

## 2021-05-05 LAB — LIPID PANEL
Chol/HDL Ratio: 4.5 ratio — ABNORMAL HIGH (ref 0.0–4.4)
Cholesterol, Total: 211 mg/dL — ABNORMAL HIGH (ref 100–199)
HDL: 47 mg/dL (ref >39)
LDL Chol Calc (NIH): 133 mg/dL — ABNORMAL HIGH (ref 0–99)
Triglycerides: 175 mg/dL — ABNORMAL HIGH (ref 0–149)
VLDL Cholesterol Cal: 31 mg/dL (ref 5–40)

## 2021-05-05 LAB — TSH: TSH: 0.664 u[IU]/mL (ref 0.450–4.500)

## 2021-06-28 NOTE — Progress Notes (Signed)
BCG Bladder Instillation  BCG # 1/3  Due to Bladder Cancer patient is present today for a BCG treatment. Patient was cleaned and prepped in a sterile fashion with betadine. A 14 FR catheter was inserted, urine return was noted 10 ml, urine was yellow clear in color.  33ml of reconstituted BCG was instilled into the bladder. The catheter was then removed. Patient tolerated well, no complications were noted  Performed by: Zara Council, PA-C and Deshannon L. Tamala Julian, CMA  Follow up/ Additional notes: One week for #2/3 BCG and 07/12/2021 for cysto

## 2021-06-29 ENCOUNTER — Other Ambulatory Visit
Admission: RE | Admit: 2021-06-29 | Discharge: 2021-06-29 | Disposition: A | Discharge status: Home / Self Care | Payer: Managed Care, Other (non HMO) | Attending: Urology | Admitting: Urology

## 2021-06-29 ENCOUNTER — Ambulatory Visit (INDEPENDENT_AMBULATORY_CARE_PROVIDER_SITE_OTHER): Payer: Managed Care, Other (non HMO) | Admitting: Urology

## 2021-06-29 ENCOUNTER — Other Ambulatory Visit: Payer: Self-pay

## 2021-06-29 ENCOUNTER — Other Ambulatory Visit: Payer: Self-pay | Admitting: *Deleted

## 2021-06-29 DIAGNOSIS — C674 Malignant neoplasm of posterior wall of bladder: Secondary | ICD-10-CM

## 2021-06-29 LAB — URINALYSIS, COMPLETE (UACMP) WITH MICROSCOPIC
Bilirubin Urine: NEGATIVE
Glucose, UA: NEGATIVE mg/dL
Leukocytes,Ua: NEGATIVE
Nitrite: NEGATIVE
Protein, ur: NEGATIVE mg/dL
Specific Gravity, Urine: 1.025 (ref 1.005–1.030)
pH: 6 (ref 5.0–8.0)

## 2021-06-29 MED ORDER — BCG LIVE 50 MG IS SUSR
3.2400 mL | Freq: Once | INTRAVESICAL | Status: AC
  Administered 2021-06-29: 81 mg via INTRAVESICAL

## 2021-07-05 NOTE — Progress Notes (Signed)
BCG Bladder Instillation  BCG # 2/3  Due to Bladder Cancer patient is present today for a BCG treatment. Patient was cleaned and prepped in a sterile fashion with betadine. A 14 FR catheter was inserted, urine return was noted 15 ml, urine was yellow in color.  25ml of reconstituted BCG was instilled into the bladder. The catheter was then removed. Patient tolerated well, no complications were noted  Performed by: Zara Council, PA-C and Edwin Dada, CMA  Follow up/ Additional notes: One week for #3/3 BCG and 07/12/2021 for cysto

## 2021-07-06 ENCOUNTER — Other Ambulatory Visit: Payer: Self-pay | Admitting: *Deleted

## 2021-07-06 ENCOUNTER — Other Ambulatory Visit
Admission: RE | Admit: 2021-07-06 | Discharge: 2021-07-06 | Disposition: A | Discharge status: Home / Self Care | Payer: Managed Care, Other (non HMO) | Attending: Urology | Admitting: Urology

## 2021-07-06 ENCOUNTER — Other Ambulatory Visit: Payer: Self-pay

## 2021-07-06 ENCOUNTER — Ambulatory Visit (INDEPENDENT_AMBULATORY_CARE_PROVIDER_SITE_OTHER): Payer: Managed Care, Other (non HMO) | Admitting: Urology

## 2021-07-06 ENCOUNTER — Encounter: Payer: Self-pay | Admitting: Urology

## 2021-07-06 DIAGNOSIS — C674 Malignant neoplasm of posterior wall of bladder: Secondary | ICD-10-CM

## 2021-07-06 LAB — URINALYSIS, COMPLETE (UACMP) WITH MICROSCOPIC
Bilirubin Urine: NEGATIVE
Glucose, UA: NEGATIVE mg/dL
Ketones, ur: NEGATIVE mg/dL
Nitrite: NEGATIVE
Protein, ur: NEGATIVE mg/dL
Specific Gravity, Urine: 1.025 (ref 1.005–1.030)
pH: 6 (ref 5.0–8.0)

## 2021-07-06 MED ORDER — BCG LIVE 50 MG IS SUSR
3.2400 mL | Freq: Once | INTRAVESICAL | Status: AC
  Administered 2021-07-06: 81 mg via INTRAVESICAL

## 2021-07-12 NOTE — Progress Notes (Signed)
BCG Bladder Instillation  BCG # 3/3  Due to Bladder Cancer patient is present today for a BCG treatment. Patient was cleaned and prepped in a sterile fashion with betadine. A 14 FR catheter was inserted, urine return was noted 5 ml, urine was yellow in color.  22ml of reconstituted BCG was instilled into the bladder. The catheter was then removed. Patient tolerated well, no complications were noted  Performed by: Nori Riis, PA-C and Tommy Rainwater, CMA   Follow up/ Additional notes: 08/18/2021 for cysto

## 2021-07-13 ENCOUNTER — Other Ambulatory Visit: Payer: Self-pay

## 2021-07-13 ENCOUNTER — Ambulatory Visit (INDEPENDENT_AMBULATORY_CARE_PROVIDER_SITE_OTHER): Payer: Managed Care, Other (non HMO) | Admitting: Urology

## 2021-07-13 ENCOUNTER — Other Ambulatory Visit
Admission: RE | Admit: 2021-07-13 | Discharge: 2021-07-13 | Disposition: A | Discharge status: Home / Self Care | Payer: Managed Care, Other (non HMO) | Attending: Urology | Admitting: Urology

## 2021-07-13 DIAGNOSIS — C674 Malignant neoplasm of posterior wall of bladder: Secondary | ICD-10-CM

## 2021-07-13 LAB — URINALYSIS, COMPLETE (UACMP) WITH MICROSCOPIC
Bilirubin Urine: NEGATIVE
Glucose, UA: NEGATIVE mg/dL
Ketones, ur: NEGATIVE mg/dL
Leukocytes,Ua: NEGATIVE
Nitrite: NEGATIVE
Protein, ur: NEGATIVE mg/dL
Specific Gravity, Urine: 1.025 (ref 1.005–1.030)
pH: 5.5 (ref 5.0–8.0)

## 2021-07-13 MED ORDER — BCG LIVE 50 MG IS SUSR
3.2400 mL | Freq: Once | INTRAVESICAL | Status: AC
  Administered 2021-07-13: 81 mg via INTRAVESICAL

## 2021-07-21 ENCOUNTER — Other Ambulatory Visit: Payer: Managed Care, Other (non HMO) | Admitting: Urology

## 2021-08-18 ENCOUNTER — Other Ambulatory Visit: Payer: Managed Care, Other (non HMO) | Admitting: Urology

## 2021-08-21 ENCOUNTER — Other Ambulatory Visit: Payer: Self-pay | Admitting: Family Medicine

## 2021-08-25 ENCOUNTER — Encounter: Payer: Self-pay | Admitting: Urology

## 2021-08-25 ENCOUNTER — Other Ambulatory Visit
Admission: RE | Admit: 2021-08-25 | Discharge: 2021-08-25 | Disposition: A | Discharge status: Home / Self Care | Payer: Managed Care, Other (non HMO) | Source: Ambulatory Visit | Attending: Urology | Admitting: Urology

## 2021-08-25 ENCOUNTER — Ambulatory Visit: Payer: Managed Care, Other (non HMO) | Admitting: Urology

## 2021-08-25 ENCOUNTER — Other Ambulatory Visit: Payer: Self-pay

## 2021-08-25 VITALS — Ht 67.5 in | Wt 242.0 lb

## 2021-08-25 DIAGNOSIS — C679 Malignant neoplasm of bladder, unspecified: Secondary | ICD-10-CM

## 2021-08-25 NOTE — Patient Instructions (Signed)
Will call with cytology results

## 2021-08-25 NOTE — Progress Notes (Signed)
Bladder cancer surveillance note   UROLOGIC HISTORY Christina Holloway is a 61 y.o. 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  She underwent TURBT on 08/10/2019 with pathology showing high-grade T1 with no muscle invasion, and repeat TURBT on 09/14/2019 showed no evidence of malignancy.  She completed induction BCG in April 2021.   She is a never smoker.   Initial Diagnosis of Bladder  Year: 07/2019 Pathology: High-grade T1, muscle present and not involved   Recurrent Bladder Cancer Diagnosis None   Treatments for Bladder Cancer 08/10/2019: Initial TURBT 3 cm, HG T1 09/14/2019: 2nd look TURBT, pathology negative 3-11/2019: Induction BCG x6 03/2020: mBCG #1 06/2020: mBCG #2 12/2020: mBCG #3 06/2021: mBCG #4     AUA Risk Category High   Cystoscopy Procedure Note: After informed consent and discussion of the procedure and its risks, Christina Holloway was positioned and prepped in the standard fashion. Cystoscopy was performed with the a flexible cystoscope. The urethra, bladder neck and entire bladder was visualized in a standard fashion, and mucosa was normal throughout.  Scar right lateral wall with no other abnormalities.  The ureteral orifices were visualized in their normal location and orientation.  No abnormalities on retroflexion. Cytology sent.   If cytology negative, cystoscopy in 4-6 months followed by BCG immediately after CT urogram for routine 2-year surveillance, call with results  Nickolas Madrid, MD 08/25/2021

## 2021-08-27 ENCOUNTER — Telehealth: Payer: Self-pay

## 2021-08-27 LAB — CYTOLOGY - NON PAP

## 2021-08-27 NOTE — Telephone Encounter (Signed)
-----   Message from Billey Co, MD sent at 08/27/2021  4:47 PM EST ----- Great news, no cancer cells on cytology.  Please set up cystoscopy in May 2023, likely BCG shortly after if cystoscopy negative  Nickolas Madrid, MD 08/27/2021

## 2021-10-20 ENCOUNTER — Other Ambulatory Visit: Payer: Self-pay

## 2021-10-20 ENCOUNTER — Ambulatory Visit
Admission: RE | Admit: 2021-10-20 | Discharge: 2021-10-20 | Disposition: A | Discharge status: Home / Self Care | Payer: Managed Care, Other (non HMO) | Source: Ambulatory Visit | Attending: Urology | Admitting: Urology

## 2021-10-20 DIAGNOSIS — C679 Malignant neoplasm of bladder, unspecified: Secondary | ICD-10-CM | POA: Insufficient documentation

## 2021-10-20 LAB — POCT I-STAT CREATININE: Creatinine, Ser: 1 mg/dL (ref 0.44–1.00)

## 2021-10-20 MED ORDER — IOHEXOL 300 MG/ML  SOLN
100.0000 mL | Freq: Once | INTRAMUSCULAR | Status: AC | PRN
  Administered 2021-10-20: 100 mL via INTRAVENOUS

## 2021-10-21 ENCOUNTER — Telehealth: Payer: Self-pay

## 2021-10-21 NOTE — Telephone Encounter (Signed)
-----   Message from Billey Co, MD sent at 10/21/2021  3:05 PM EST ----- ?No urology abnormalities on her CT, however it looks like she may have diverticulitis which is an infection of the bowel that can cause lower abdominal pain.  Recommend she reach out to her PCP for likely antibiotic treatment ? ?Nickolas Madrid, MD ?10/21/2021 ? ? ?

## 2021-10-21 NOTE — Telephone Encounter (Signed)
Called pt no answer. Left detailed message for pt per DPR.  ?

## 2021-11-06 ENCOUNTER — Other Ambulatory Visit: Payer: Self-pay | Admitting: Nurse Practitioner

## 2021-11-06 DIAGNOSIS — I10 Essential (primary) hypertension: Secondary | ICD-10-CM

## 2021-11-06 NOTE — Progress Notes (Signed)
?  ? ?I,Joseline E Rosas,acting as a scribe for Lavon Paganini, MD.,have documented all relevant documentation on the behalf of Lavon Paganini, MD,as directed by  Lavon Paganini, MD while in the presence of Lavon Paganini, MD.  ? ?Established patient visit ? ? ?Patient: Christina Holloway   DOB: [birth year]   61 y.o. Female  MRN: [id number] ?Visit Date: 11/09/2021 ? ?Today's healthcare provider: Lavon Paganini, MD  ? ?Chief Complaint  ?Patient presents with  ? follow up chronic disease  ? ?Subjective  ?  ?HPI  ?Hypertension, follow-up ? ?BP Readings from Last 3 Encounters:  ?11/09/21 136/86  ?05/04/21 132/72  ?12/30/20 130/74  ? Wt Readings from Last 3 Encounters:  ?11/09/21 243 lb 1.6 oz (110.3 kg)  ?08/25/21 242 lb (109.8 kg)  ?05/04/21 242 lb (109.8 kg)  ?  ? ?She was last seen for hypertension 5 months ago.  ?BP at that visit was 132/72. ?Management since that visit includes stop Atenolol and increase HCTZ dose to 9m daily. ?She reports excellent compliance with treatment. ?She is not having side effects.  ?She is not exercising. ?She is not adherent to low salt diet.   ?Outside blood pressures are not being checked. ? ?She does not smoke. ? ? ?--------------------------------------------------------------------------------------------------- ?Lipid/Cholesterol, follow-up ? ?Last Lipid Panel: ?Lab Results  ?Component Value Date  ? CHOL 211 (H) 05/04/2021  ? LDLCALC 133 (H) 05/04/2021  ? HDL 47 05/04/2021  ? TRIG 175 (H) 05/04/2021  ? ? ?She was last seen for this 6 months ago.  ?Management since that visit includes continue Rosuvastatin. ? ?She reports excellent compliance with treatment. ?She is not having side effects.  ? ?Symptoms: ?No appetite changes No foot ulcerations  ?No chest pain No chest pressure/discomfort  ?No dyspnea No orthopnea  ?No fatigue No lower extremity edema  ?No palpitations No paroxysmal nocturnal dyspnea  ?No nausea No numbness or tingling of extremity  ?No polydipsia No  polyuria  ?No speech difficulty No syncope  ? ? ?Last metabolic panel ?Lab Results  ?Component Value Date  ? GLUCOSE 95 05/04/2021  ? NA 142 05/04/2021  ? K 4.5 05/04/2021  ? BUN 14 05/04/2021  ? CREATININE 1.00 10/20/2021  ? EGFR 72 05/04/2021  ? GFRNONAA 68 02/15/2020  ? CALCIUM 10.0 05/04/2021  ? AST 21 05/04/2021  ? ALT 37 (H) 05/04/2021  ? ?The 10-year ASCVD risk score (Arnett DK, et al., 2019) is: 8.9% ? ?--------------------------------------------------------------------------------------------------- ? ? ?Medications: ?Outpatient Medications Prior to Visit  ?Medication Sig  ? colchicine 0.6 MG tablet Take 1.235mPO x1 at first sign of gout flare, then 0.108m68m hour later. Continue 0.108mg13mD until 48 hours after resolution of flare.  ? docusate sodium (COLACE) 100 MG capsule Take 1 capsule (100 mg total) by mouth daily.  ? erythromycin ophthalmic ointment Place 1 application into the right eye 3 (three) times daily as needed. Use the eye ointment for one week.  ? [DISCONTINUED] amLODipine (NORVASC) 10 MG tablet Take 1 tablet (10 mg total) by mouth daily.  ? [DISCONTINUED] hydrochlorothiazide (HYDRODIURIL) 25 MG tablet TAKE 1 TABLET BY MOUTH DAILY  ? [DISCONTINUED] losartan (COZAAR) 100 MG tablet TAKE 1 TABLET BY MOUTH DAILY  ? [DISCONTINUED] rosuvastatin (CRESTOR) 5 MG tablet Take 1 tablet (5 mg total) by mouth daily.  ? [DISCONTINUED] ketorolac (ACULAR) 0.5 % ophthalmic solution Place 1 drop into the right eye 3 (three) times daily as needed.  ? ?No facility-administered medications prior to visit.  ? ? ?Review of Systems  per HPI ? ? ?  Objective  ?  ?BP 136/86 (BP Location: Left Arm, Patient Position: Sitting, Cuff Size: Large)   Pulse 72   Temp 97.7 ?F (36.5 ?C) (Temporal)   Resp 16   Wt 243 lb 1.6 oz (110.3 kg)   BMI 37.51 kg/m?  ? ? ?Physical Exam ?Vitals reviewed.  ?Constitutional:   ?   General: She is not in acute distress. ?   Appearance: Normal appearance. She is well-developed. She is not  diaphoretic.  ?HENT:  ?   Head: Normocephalic and atraumatic.  ?Eyes:  ?   General: No scleral icterus. ?   Conjunctiva/sclera: Conjunctivae normal.  ?Neck:  ?   Thyroid: No thyromegaly.  ?Cardiovascular:  ?   Rate and Rhythm: Normal rate and regular rhythm.  ?   Pulses: Normal pulses.  ?   Heart sounds: Normal heart sounds. No murmur heard. ?Pulmonary:  ?   Effort: Pulmonary effort is normal. No respiratory distress.  ?   Breath sounds: Normal breath sounds. No wheezing, rhonchi or rales.  ?Musculoskeletal:  ?   Cervical back: Neck supple.  ?   Right lower leg: No edema.  ?   Left lower leg: No edema.  ?Lymphadenopathy:  ?   Cervical: No cervical adenopathy.  ?Skin: ?   General: Skin is warm and dry.  ?   Findings: No rash.  ?Neurological:  ?   Mental Status: She is alert and oriented to person, place, and time. Mental status is at baseline.  ?Psychiatric:     ?   Mood and Affect: Mood normal.     ?   Behavior: Behavior normal.  ?  ? ? ?No results found for any visits on 11/09/21. ? Assessment & Plan  ?  ? ?Problem List Items Addressed This Visit   ? ?  ? Cardiovascular and Mediastinum  ? Essential (primary) hypertension - Primary  ?  Chronic and elevated today but Well controlled on manual recheck ?Encourage low Na diet ?Continue current medications ?Recheck metabolic panel ?F/u in 6 months  ?  ?  ? Relevant Medications  ? amLODipine (NORVASC) 10 MG tablet  ? hydrochlorothiazide (HYDRODIURIL) 25 MG tablet  ? losartan (COZAAR) 100 MG tablet  ? rosuvastatin (CRESTOR) 5 MG tablet  ? Other Relevant Orders  ? Basic Metabolic Panel (BMET)  ?  ? Other  ? Anemia, iron deficiency  ?  Normal on last check ?Recheck CBC ?  ?  ? Relevant Orders  ? CBC  ? Hypercholesterolemia without hypertriglyceridemia  ?  Previously well controlled ?Continue statin ?Repeat FLP and CMP annually ?  ?  ? Relevant Medications  ? amLODipine (NORVASC) 10 MG tablet  ? hydrochlorothiazide (HYDRODIURIL) 25 MG tablet  ? losartan (COZAAR) 100 MG tablet   ? rosuvastatin (CRESTOR) 5 MG tablet  ? Morbid obesity ([place])  ?  BMI 37 and assoc with HTN, HLD ?Discussed importance of healthy weight management ?Discussed diet and exercise  ?  ?  ? Alcohol abuse  ?  Has cut back on intake and only drinking beer now ?  ?  ?  ? ?Return in about 6 months (around 05/12/2022) for CPE.  ?   ? ?I, Lavon Paganini, MD, have reviewed all documentation for this visit. The documentation on 11/09/21 for the exam, diagnosis, procedures, and orders are all accurate and complete. ? ? ?Virginia Crews, MD, MPH ?[place] ?[facility]   ?

## 2021-11-09 ENCOUNTER — Ambulatory Visit: Payer: Managed Care, Other (non HMO) | Admitting: Family Medicine

## 2021-11-09 ENCOUNTER — Encounter: Payer: Self-pay | Admitting: Family Medicine

## 2021-11-09 ENCOUNTER — Other Ambulatory Visit: Payer: Self-pay

## 2021-11-09 VITALS — BP 136/86 | HR 72 | Temp 97.7°F | Resp 16 | Wt 243.1 lb

## 2021-11-09 DIAGNOSIS — E7800 Pure hypercholesterolemia, unspecified: Secondary | ICD-10-CM | POA: Diagnosis not present

## 2021-11-09 DIAGNOSIS — I10 Essential (primary) hypertension: Secondary | ICD-10-CM | POA: Diagnosis not present

## 2021-11-09 DIAGNOSIS — F1010 Alcohol abuse, uncomplicated: Secondary | ICD-10-CM

## 2021-11-09 DIAGNOSIS — D509 Iron deficiency anemia, unspecified: Secondary | ICD-10-CM | POA: Diagnosis not present

## 2021-11-09 MED ORDER — AMLODIPINE BESYLATE 10 MG PO TABS: 10.0000 mg | ORAL_TABLET | Freq: Every day | ORAL | 2 refills | Status: DC

## 2021-11-09 MED ORDER — LOSARTAN POTASSIUM 100 MG PO TABS: 100.0000 mg | ORAL_TABLET | Freq: Every day | ORAL | 1 refills | Status: DC

## 2021-11-09 MED ORDER — FLUTICASONE PROPIONATE 50 MCG/ACT NA SUSP: 2.0000 | Freq: Every day | NASAL | 6 refills | Status: AC

## 2021-11-09 MED ORDER — ROSUVASTATIN CALCIUM 5 MG PO TABS: 5.0000 mg | ORAL_TABLET | Freq: Every day | ORAL | 3 refills | Status: DC

## 2021-11-09 MED ORDER — CETIRIZINE HCL 10 MG PO TABS: 10.0000 mg | ORAL_TABLET | Freq: Every day | ORAL | 3 refills | Status: AC

## 2021-11-09 MED ORDER — HYDROCHLOROTHIAZIDE 25 MG PO TABS: 25.0000 mg | ORAL_TABLET | Freq: Every day | ORAL | 1 refills | Status: DC

## 2021-11-09 NOTE — Assessment & Plan Note (Signed)
Normal on last check ?Recheck CBC ?

## 2021-11-09 NOTE — Assessment & Plan Note (Signed)
BMI 37 and assoc with HTN, HLD ?Discussed importance of healthy weight management ?Discussed diet and exercise  ?

## 2021-11-09 NOTE — Assessment & Plan Note (Signed)
Previously well controlled Continue statin Repeat FLP and CMP annually 

## 2021-11-09 NOTE — Assessment & Plan Note (Addendum)
Chronic and elevated today but Well controlled on manual recheck ?Encourage low Na diet ?Continue current medications ?Recheck metabolic panel ?F/u in 6 months  ?

## 2021-11-09 NOTE — Assessment & Plan Note (Signed)
Has cut back on intake and only drinking beer now ?

## 2021-11-10 LAB — CBC
Hematocrit: 37.3 % (ref 34.0–46.6)
Hemoglobin: 12.5 g/dL (ref 11.1–15.9)
MCH: 29.4 pg (ref 26.6–33.0)
MCHC: 33.5 g/dL (ref 31.5–35.7)
MCV: 88 fL (ref 79–97)
Platelets: 319 10*3/uL (ref 150–450)
RBC: 4.25 x10E6/uL (ref 3.77–5.28)
RDW: 12.5 % (ref 11.7–15.4)
WBC: 5.8 10*3/uL (ref 3.4–10.8)

## 2021-11-10 LAB — BASIC METABOLIC PANEL
BUN/Creatinine Ratio: 15 (ref 9–23)
BUN: 13 mg/dL (ref 6–24)
CO2: 24 mmol/L (ref 20–29)
Calcium: 9.9 mg/dL (ref 8.7–10.2)
Chloride: 102 mmol/L (ref 96–106)
Creatinine, Ser: 0.87 mg/dL (ref 0.57–1.00)
Glucose: 87 mg/dL (ref 70–99)
Potassium: 4.1 mmol/L (ref 3.5–5.2)
Sodium: 139 mmol/L (ref 134–144)
eGFR: 77 mL/min/{1.73_m2} (ref >59)

## 2021-12-14 ENCOUNTER — Telehealth: Payer: Self-pay

## 2021-12-14 NOTE — Telephone Encounter (Signed)
Patient advised that Dr. Jacinto Reap is out of the office and that she can drop off form and we would let her know if appt is needed when Dr. B reviews form.  ?

## 2021-12-14 NOTE — Telephone Encounter (Signed)
Copied from [place] [id number]. Topic: General - Other ?>> Dec 14, 2021 11:40 AM McGill, Nelva Bush wrote: ?Reason for CRM: Pt stated she has a form for her job that needs to be filled out by PCP. Pt stated it was a wellness screening. Pt mentioned she recently saw PCP on 03/20 and mentioned to her. Pt asking if she can drop off form. ? ?Pt requesting a call back. ?

## 2021-12-16 IMAGING — MG MM DIGITAL SCREENING BILAT W/ TOMO AND CAD
8 series · 8 of 24 positions shown · non-contrast
Comparison: Previous exam(s).

CLINICAL DATA: Screening.

EXAM:
DIGITAL SCREENING BILATERAL MAMMOGRAM WITH TOMOSYNTHESIS AND CAD
TECHNIQUE: Bilateral screening digital craniocaudal and mediolateral oblique
mammograms were obtained. Bilateral screening digital breast
tomosynthesis was performed. The images were evaluated with
computer-aided detection.

[R MLO synth-2D]
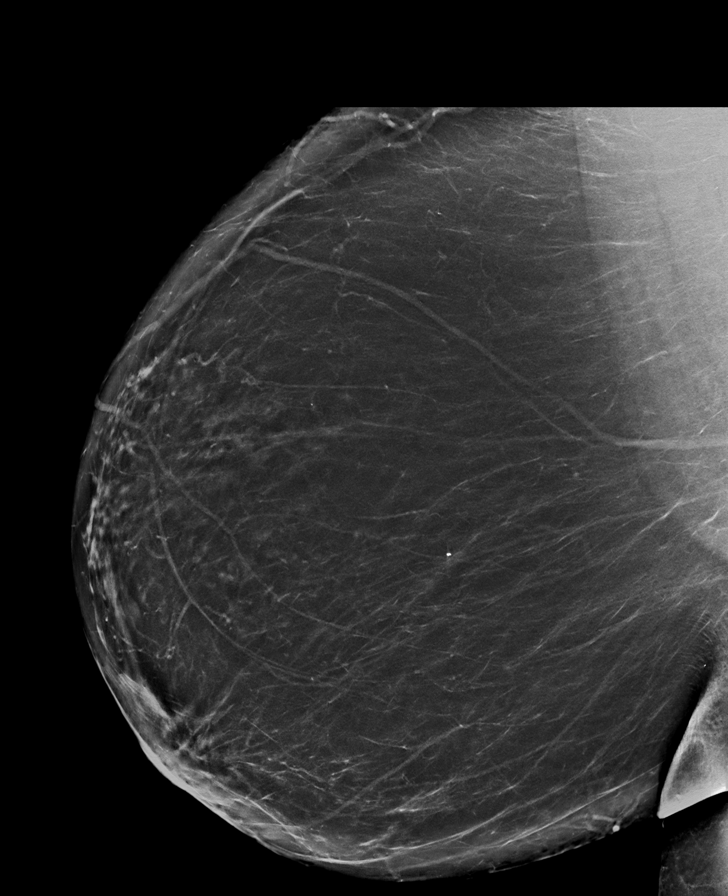

[L CC synth-2D]
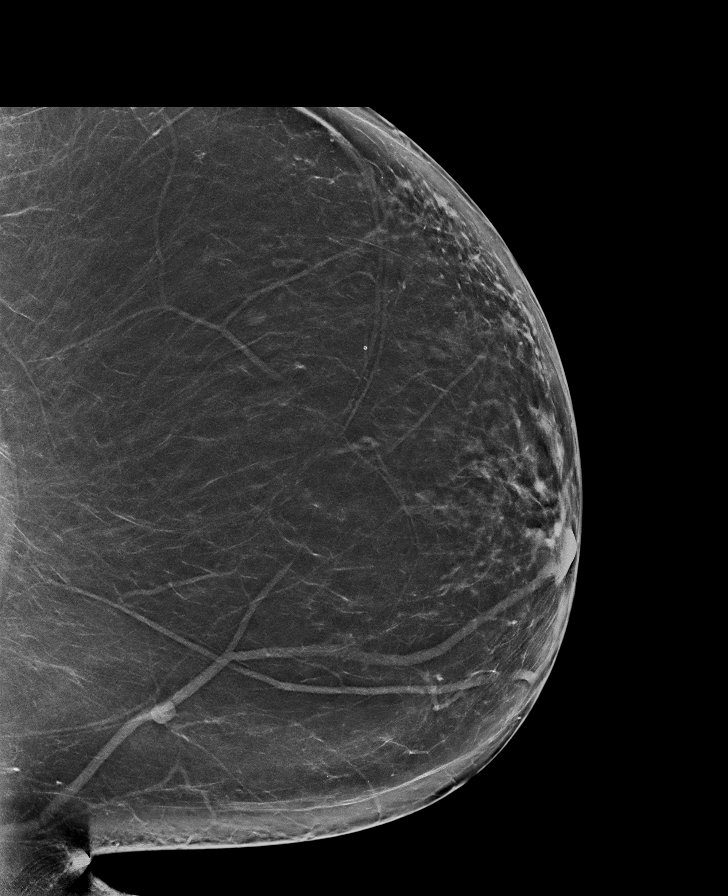

[L MLO synth-2D]
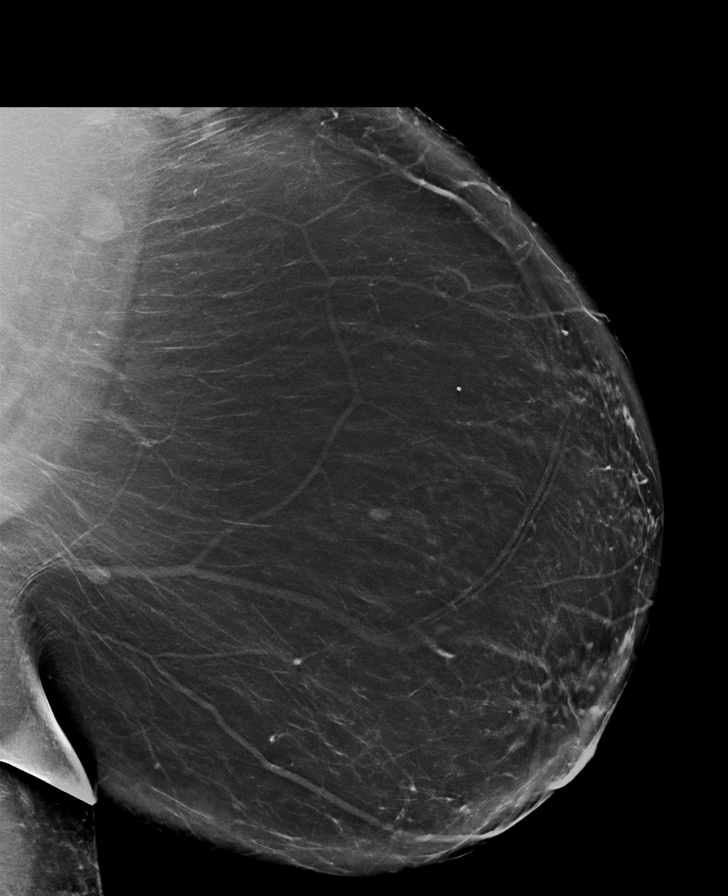

[R CC synth-2D]
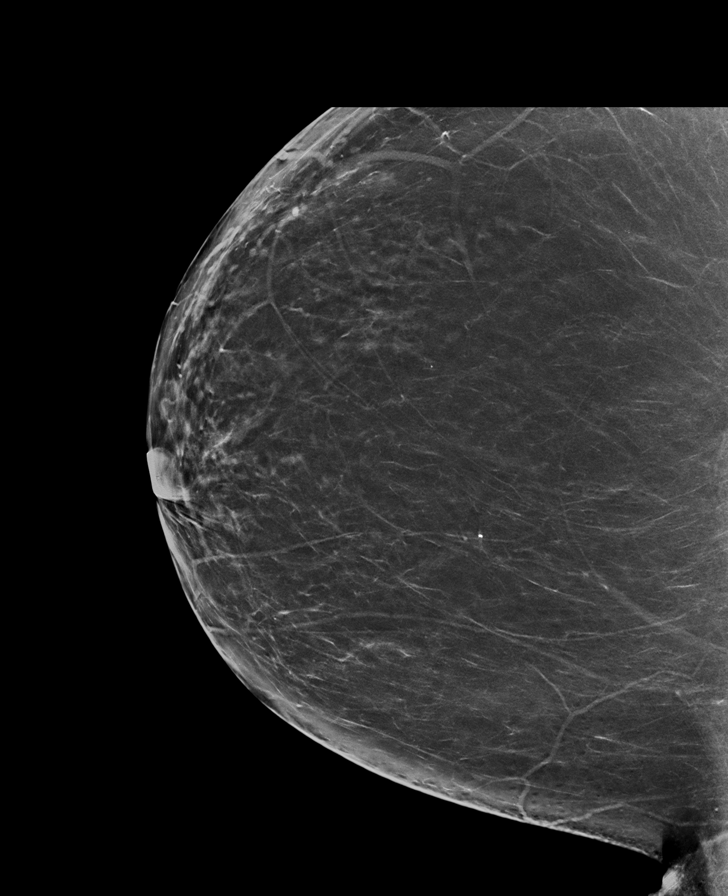

[L CC tomo · tomo slice 45/88.0]
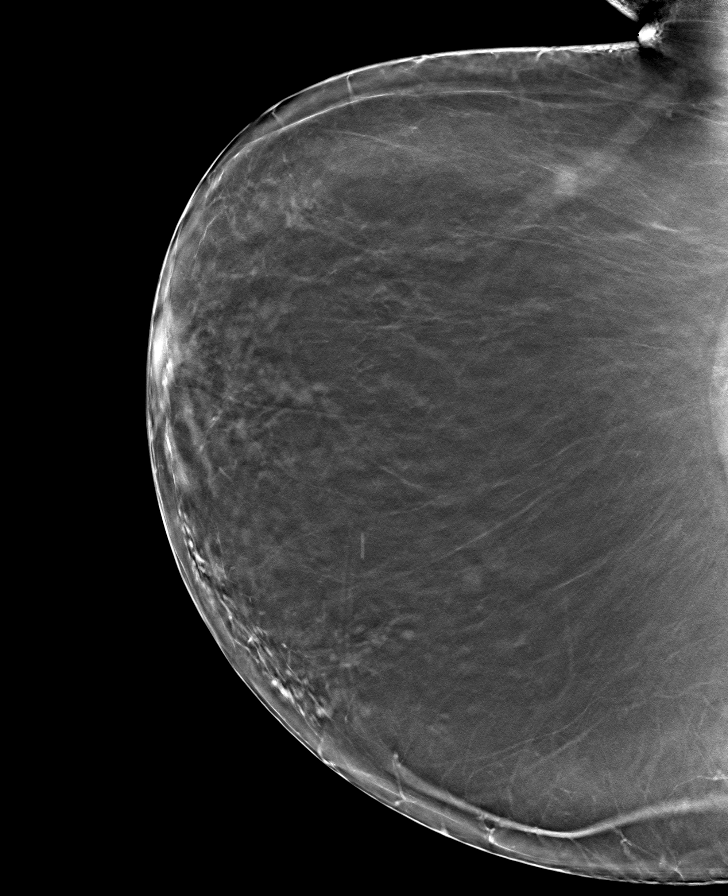

[R CC tomo · tomo slice 45/89.0]
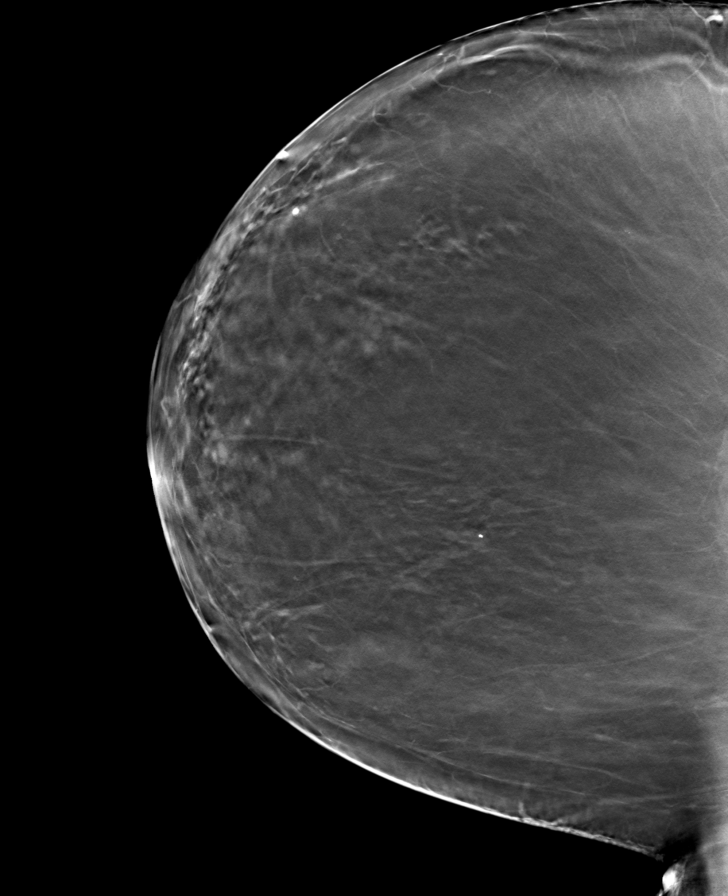

[L MLO tomo · tomo slice 52/103.0]
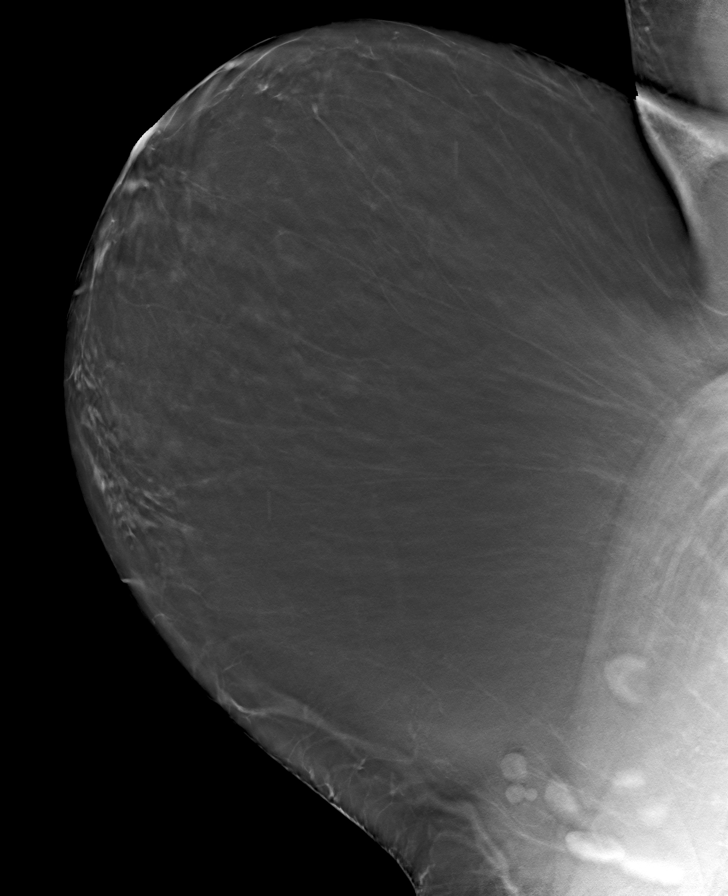

[R MLO tomo · tomo slice 49/98.0]
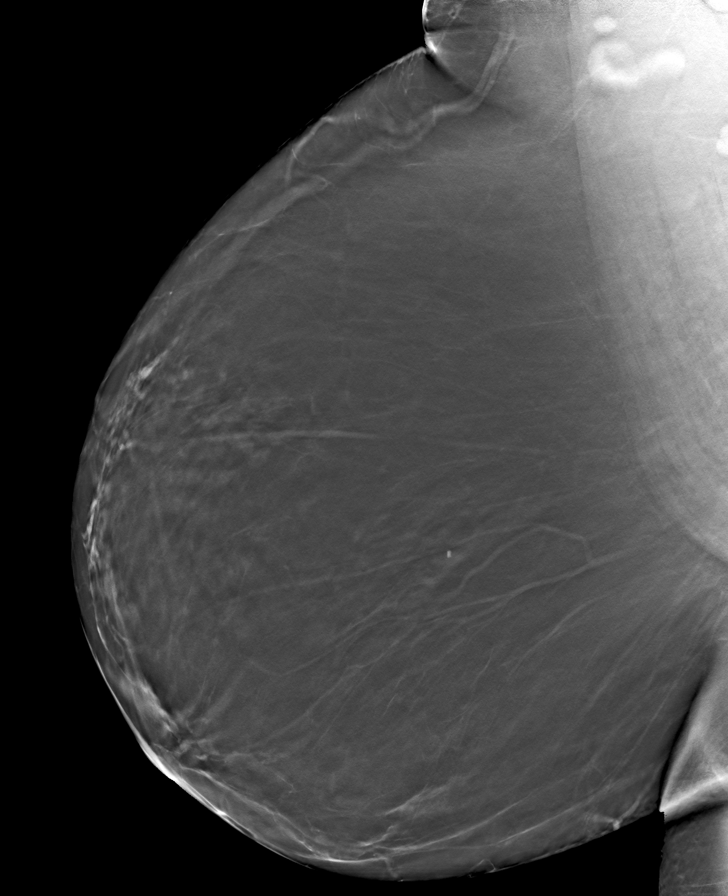

[8 of 24 positions shown; findings below may reference images not displayed]

ACR Breast Density Category b: There are scattered areas of
fibroglandular density.
FINDINGS: There are no findings suspicious for malignancy.
IMPRESSION: No mammographic evidence of malignancy. A result letter of this
screening mammogram will be mailed directly to the patient.

RECOMMENDATION:
Screening mammogram in one year. (Code:51-O-LD2)

BI-RADS CATEGORY  1: Negative.

## 2021-12-29 ENCOUNTER — Other Ambulatory Visit: Payer: Self-pay | Admitting: *Deleted

## 2021-12-29 ENCOUNTER — Ambulatory Visit (INDEPENDENT_AMBULATORY_CARE_PROVIDER_SITE_OTHER): Payer: Managed Care, Other (non HMO) | Admitting: Urology

## 2021-12-29 ENCOUNTER — Encounter: Payer: Self-pay | Admitting: Urology

## 2021-12-29 ENCOUNTER — Other Ambulatory Visit
Admission: RE | Admit: 2021-12-29 | Discharge: 2021-12-29 | Disposition: A | Discharge status: Home / Self Care | Payer: Managed Care, Other (non HMO) | Attending: Urology | Admitting: Urology

## 2021-12-29 VITALS — Ht 67.0 in | Wt 243.0 lb

## 2021-12-29 DIAGNOSIS — Z08 Encounter for follow-up examination after completed treatment for malignant neoplasm: Secondary | ICD-10-CM | POA: Diagnosis not present

## 2021-12-29 DIAGNOSIS — Z8551 Personal history of malignant neoplasm of bladder: Secondary | ICD-10-CM | POA: Diagnosis not present

## 2021-12-29 DIAGNOSIS — C672 Malignant neoplasm of lateral wall of bladder: Secondary | ICD-10-CM

## 2021-12-29 DIAGNOSIS — C679 Malignant neoplasm of bladder, unspecified: Secondary | ICD-10-CM | POA: Diagnosis present

## 2021-12-29 LAB — URINALYSIS, COMPLETE (UACMP) WITH MICROSCOPIC
Bilirubin Urine: NEGATIVE
Glucose, UA: NEGATIVE mg/dL
Ketones, ur: NEGATIVE mg/dL
Leukocytes,Ua: NEGATIVE
Nitrite: NEGATIVE
Protein, ur: NEGATIVE mg/dL
Specific Gravity, Urine: 1.015 (ref 1.005–1.030)
pH: 6.5 (ref 5.0–8.0)

## 2021-12-29 NOTE — Progress Notes (Signed)
Bladder cancer surveillance note ?  ?UROLOGIC HISTORY ?Christina Holloway is a 61 y.o. 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  She underwent TURBT on 08/10/2019 with pathology showing high-grade T1 with no muscle invasion, and repeat TURBT on 09/14/2019 showed no evidence of malignancy.  She completed induction BCG in April 2021. ?  ?She is a never smoker. ?  ?Initial Diagnosis of Bladder  ?Year: 07/2019 ?Pathology: High-grade T1, muscle present and not involved ?  ?Recurrent Bladder Cancer Diagnosis ?None ?  ?Treatments for Bladder Cancer ?08/10/2019: Initial TURBT 3 cm, HG T1 ?09/14/2019: 2nd look TURBT, pathology negative ?3-11/2019: Induction BCG x6 ?03/2020: mBCG #1 ?06/2020: mBCG #2 ?12/2020: mBCG #3 ?06/2021: mBCG #4 ?  ?  ?AUA Risk Category ?High ?  ?Cystoscopy Procedure Note: ?After informed consent and discussion of the procedure and its risks, TAHJ LINDSETH was positioned and prepped in the standard fashion. Cystoscopy was performed with the a flexible cystoscope. The urethra, bladder neck and entire bladder was visualized in a standard fashion, and mucosa was normal throughout.  Scar right lateral wall with no other abnormalities.  The ureteral orifices were visualized in their normal location and orientation.  No abnormalities on retroflexion.  ? ?CT urogram March 2023 with no urologic abnormalities ?  ?Schedule maintenance BCG now, RTC next cystoscopy 6 months ? ?Nickolas Madrid, MD ?12/29/2021 ? ?

## 2022-01-01 ENCOUNTER — Telehealth: Payer: Self-pay

## 2022-01-01 NOTE — Telephone Encounter (Signed)
Pt scheduled for 3 mBCG appts. Pt confirmed.  ?

## 2022-01-01 NOTE — Telephone Encounter (Signed)
-----   Message from Billey Co, MD sent at 12/29/2021  2:02 PM EDT ----- ?Regarding: BCG ?Please schedule maintenance BCG x3 ASAP, she will then follow-up for next cystoscopy with me in November 2023 ? ?Thanks, ?Nickolas Madrid, MD ?12/29/2021 ? ? ? ?

## 2022-01-07 ENCOUNTER — Ambulatory Visit (INDEPENDENT_AMBULATORY_CARE_PROVIDER_SITE_OTHER): Payer: Managed Care, Other (non HMO) | Admitting: Physician Assistant

## 2022-01-07 DIAGNOSIS — C679 Malignant neoplasm of bladder, unspecified: Secondary | ICD-10-CM | POA: Diagnosis not present

## 2022-01-07 MED ORDER — BCG LIVE 50 MG IS SUSR
3.2400 mL | Freq: Once | INTRAVESICAL | Status: AC
  Administered 2022-01-07: 81 mg via INTRAVESICAL

## 2022-01-07 NOTE — Progress Notes (Signed)
BCG Bladder Instillation  BCG # 1 of 3  Due to Bladder Cancer patient is present today for a BCG treatment. Patient was cleaned and prepped in a sterile fashion with betadine. A 14FR catheter was inserted, urine return was noted 70m, urine was yellow in color.  587mof reconstituted BCG was instilled into the bladder. The catheter was then removed. Patient tolerated well, no complications were noted  Performed by: SaDebroah LoopPA-C   Follow up/ Additional notes: 1 week for BCG #2 of 3

## 2022-01-08 LAB — URINALYSIS, COMPLETE
Bilirubin, UA: NEGATIVE
Glucose, UA: NEGATIVE
Ketones, UA: NEGATIVE
Nitrite, UA: NEGATIVE
Protein,UA: NEGATIVE
Specific Gravity, UA: 1.025 (ref 1.005–1.030)
Urobilinogen, Ur: 0.2 mg/dL (ref 0.2–1.0)
pH, UA: 6 (ref 5.0–7.5)

## 2022-01-08 LAB — MICROSCOPIC EXAMINATION

## 2022-01-12 LAB — CULTURE, URINE COMPREHENSIVE

## 2022-01-14 ENCOUNTER — Ambulatory Visit (INDEPENDENT_AMBULATORY_CARE_PROVIDER_SITE_OTHER): Payer: Managed Care, Other (non HMO) | Admitting: Physician Assistant

## 2022-01-14 DIAGNOSIS — C679 Malignant neoplasm of bladder, unspecified: Secondary | ICD-10-CM

## 2022-01-14 LAB — URINALYSIS, COMPLETE
Bilirubin, UA: NEGATIVE
Glucose, UA: NEGATIVE
Ketones, UA: NEGATIVE
Leukocytes,UA: NEGATIVE
Nitrite, UA: NEGATIVE
Protein,UA: NEGATIVE
Specific Gravity, UA: 1.02 (ref 1.005–1.030)
Urobilinogen, Ur: 0.2 mg/dL (ref 0.2–1.0)
pH, UA: 6.5 (ref 5.0–7.5)

## 2022-01-14 LAB — MICROSCOPIC EXAMINATION: Bacteria, UA: NONE SEEN

## 2022-01-14 MED ORDER — BCG LIVE 50 MG IS SUSR
3.2400 mL | Freq: Once | INTRAVESICAL | Status: AC
  Administered 2022-01-14: 81 mg via INTRAVESICAL

## 2022-01-14 NOTE — Progress Notes (Signed)
BCG Bladder Instillation   BCG # 2 of 3   Due to Bladder Cancer patient is present today for a BCG treatment. Patient was cleaned and prepped in a sterile fashion with betadine. A 14FR catheter was inserted, urine return was noted 52m, urine was yellow in color.  534mof reconstituted BCG was instilled into the bladder. The catheter was then removed. Patient tolerated well, no complications were noted   Performed by: JeGaspar ColaMA and CaElberta LeatherwoodMA   Follow up/ Additional notes: 1 week for BCG #3- 3

## 2022-01-21 ENCOUNTER — Ambulatory Visit (INDEPENDENT_AMBULATORY_CARE_PROVIDER_SITE_OTHER): Payer: Managed Care, Other (non HMO) | Admitting: Physician Assistant

## 2022-01-21 DIAGNOSIS — D494 Neoplasm of unspecified behavior of bladder: Secondary | ICD-10-CM

## 2022-01-21 DIAGNOSIS — C679 Malignant neoplasm of bladder, unspecified: Secondary | ICD-10-CM | POA: Diagnosis not present

## 2022-01-21 MED ORDER — BCG LIVE 50 MG IS SUSR
3.2400 mL | Freq: Once | INTRAVESICAL | Status: AC
  Administered 2022-01-21: 81 mg via INTRAVESICAL

## 2022-01-21 NOTE — Progress Notes (Signed)
BCG Bladder Instillation  BCG # 3 of 3  Due to Bladder Cancer patient is present today for a BCG treatment. Patient was cleaned and prepped in a sterile fashion with betadine. A 14FR catheter was inserted, urine return was noted 80m, urine was yellow in color.  538mof reconstituted BCG was instilled into the bladder. The catheter was then removed. Patient tolerated well, no complications were noted  Performed by: SaDebroah LoopPA-C   Follow up/ Additional notes: Cysto with Dr. SnDiamantina Providencen November

## 2022-01-22 LAB — URINALYSIS, COMPLETE
Bilirubin, UA: NEGATIVE
Glucose, UA: NEGATIVE
Ketones, UA: NEGATIVE
Nitrite, UA: NEGATIVE
Protein,UA: NEGATIVE
Specific Gravity, UA: 1.02 (ref 1.005–1.030)
Urobilinogen, Ur: 0.2 mg/dL (ref 0.2–1.0)
pH, UA: 6.5 (ref 5.0–7.5)

## 2022-01-22 LAB — MICROSCOPIC EXAMINATION

## 2022-04-16 NOTE — Telephone Encounter (Signed)
Pt stated she was advised her wellness form was ready for pick up. Pt is asking if her wellness form can be faxed to [facility] directly.  Fax- [phone]    Please advise.

## 2022-04-16 NOTE — Telephone Encounter (Signed)
Form faxed to number provided by patient.

## 2022-05-17 ENCOUNTER — Encounter: Payer: Managed Care, Other (non HMO) | Admitting: Family Medicine

## 2022-05-19 ENCOUNTER — Other Ambulatory Visit: Payer: Self-pay | Admitting: Family Medicine

## 2022-05-19 DIAGNOSIS — Z1231 Encounter for screening mammogram for malignant neoplasm of breast: Secondary | ICD-10-CM

## 2022-05-31 ENCOUNTER — Telehealth: Payer: Self-pay

## 2022-05-31 NOTE — Telephone Encounter (Signed)
Patient says [employer] hasn't received form and she hasn't got her points for doing so with her insurance. I did let her know it was faxed but she says it hasnt been received.

## 2022-05-31 NOTE — Telephone Encounter (Signed)
Copied from [place] [phone]. Topic: General - Other >> May 31, 2022 11:59 AM Everette C wrote: Reason for CRM: The patient has called to follow up on a previously request for completion of forms related to their physical and the claim related with it   The patient shares that the forms were dropped off roughly a week ago   The visit needs to be noted as preventative in order for [employer] to cover the visit   The form can be submitted to [facility] [phone]  The patient would like to be notified when the forms are ready to be picked up

## 2022-05-31 NOTE — Telephone Encounter (Signed)
I haven't seen any forms.  Did they maybe get given to someone else ([place]?) while I was out of the office?  I have completed everything in my box since returning from leave.

## 2022-06-01 NOTE — Telephone Encounter (Signed)
Patients last CPE was done 04/2021. Patient scheduled for CPE 06/03/22.

## 2022-06-03 ENCOUNTER — Encounter: Payer: Self-pay | Admitting: Physician Assistant

## 2022-06-03 ENCOUNTER — Ambulatory Visit (INDEPENDENT_AMBULATORY_CARE_PROVIDER_SITE_OTHER): Payer: Managed Care, Other (non HMO) | Admitting: Physician Assistant

## 2022-06-03 ENCOUNTER — Ambulatory Visit
Admission: RE | Admit: 2022-06-03 | Discharge: 2022-06-03 | Disposition: A | Discharge status: Home / Self Care | Payer: Managed Care, Other (non HMO) | Source: Ambulatory Visit | Attending: Family Medicine | Admitting: Family Medicine

## 2022-06-03 VITALS — BP 103/72 | HR 76 | Temp 98.3°F | Wt 230.0 lb

## 2022-06-03 DIAGNOSIS — Z23 Encounter for immunization: Secondary | ICD-10-CM

## 2022-06-03 DIAGNOSIS — Z0000 Encounter for general adult medical examination without abnormal findings: Secondary | ICD-10-CM

## 2022-06-03 DIAGNOSIS — I10 Essential (primary) hypertension: Secondary | ICD-10-CM

## 2022-06-03 DIAGNOSIS — Z1231 Encounter for screening mammogram for malignant neoplasm of breast: Secondary | ICD-10-CM | POA: Diagnosis present

## 2022-06-03 DIAGNOSIS — M10071 Idiopathic gout, right ankle and foot: Secondary | ICD-10-CM

## 2022-06-03 DIAGNOSIS — E7800 Pure hypercholesterolemia, unspecified: Secondary | ICD-10-CM

## 2022-06-03 NOTE — Assessment & Plan Note (Signed)
Chronic and well controlled continue current medications Ordered cmp F/u 6 mo

## 2022-06-03 NOTE — Assessment & Plan Note (Signed)
Repeat fasting lipids for stability Managed w/ crestor 5 mg The 10-year ASCVD risk score (Arnett DK, et al., 2019) is: 3.9%

## 2022-06-03 NOTE — Assessment & Plan Note (Addendum)
Last flare 6 mo ago takes colchicine PRN

## 2022-06-03 NOTE — Progress Notes (Signed)
Argentina Ponder [place],acting as a scribe for Mikey Kirschner, PA-C.,have documented all relevant documentation on the behalf of Mikey Kirschner, PA-C,as directed by  Mikey Kirschner, PA-C while in the presence of Mikey Kirschner, PA-C.    Complete physical exam   Patient: Christina Holloway   DOB: [birth year]   61 y.o. Female  MRN: [id number] Visit Date: 06/03/2022  Today's healthcare provider: Mikey Kirschner, PA-C   Cc. cpe  Subjective    Christina Holloway is a 61 y.o. female who presents today for a complete physical exam.  No concerns.  Past Medical History:  Diagnosis Date   Abnormal LFTs 05/02/2015   Anemia, iron deficiency 08/24/2003   in past   Arthritis    lower back, legs   Calcium blood increased 05/02/2015   Cancer ([place])    Decreased potassium in the blood 05/02/2015   Essential (primary) hypertension 08/24/2003   Gout 05/02/2015   Hypercholesterolemia without hypertriglyceridemia 04/29/2009   Malaise and fatigue 05/02/2015   Shortness of breath dyspnea    1 flight stairs   Wears dentures    full upper, partial lower   Past Surgical History:  Procedure Laterality Date   COLONOSCOPY WITH PROPOFOL N/A 05/08/2015   Procedure: COLONOSCOPY WITH PROPOFOL;  Surgeon: Lucilla Lame, MD;  Location: [place];  Service: Endoscopy;  Laterality: N/A;   COLONOSCOPY WITH PROPOFOL N/A 05/30/2018   Procedure: COLONOSCOPY WITH PROPOFOL;  Surgeon: Lin Landsman, MD;  Location: [facility] ENDOSCOPY;  Service: Gastroenterology;  Laterality: N/A;   CYSTOSCOPY WITH FULGERATION  09/14/2019   Procedure: CYSTOSCOPY WITH FULGERATION;  Surgeon: Billey Co, MD;  Location: [facility]S;  Service: Urology;;   ENDOMETRIAL ABLATION     POLYPECTOMY  05/08/2015   Procedure: POLYPECTOMY;  Surgeon: Lucilla Lame, MD;  Location: [place];  Service: Endoscopy;;   TRANSURETHRAL RESECTION OF BLADDER TUMOR WITH MITOMYCIN-C N/A 08/10/2019   Procedure: TRANSURETHRAL RESECTION OF BLADDER TUMOR WITH Gemcitabine;   Surgeon: Billey Co, MD;  Location: [facility];  Service: Urology;  Laterality: N/A;   VAGINAL HYSTERECTOMY     Social History   Socioeconomic History   Marital status: Single    Spouse name: Not on file   Number of children: Not on file   Years of education: Not on file   Highest education level: Not on file  Occupational History   Not on file  Tobacco Use   Smoking status: Never    Passive exposure: Never   Smokeless tobacco: Never  Substance and Sexual Activity   Alcohol use: Yes    Alcohol/week: 36.0 standard drinks of alcohol    Types: 36 Cans of beer per week   Drug use: No   Sexual activity: Yes  Other Topics Concern   Not on file  Social History Narrative   Not on file   Social Determinants of Health   Financial Resource Strain: Not on file  Food Insecurity: Not on file  Transportation Needs: Not on file  Physical Activity: Not on file  Stress: Stress Concern Present (05/30/2018)   [place]    Feeling of Stress : To some extent  Social Connections: Unknown (05/30/2018)   Social Connection and Isolation Panel [NHANES]    Frequency of Communication with Friends and Family: Patient refused    Frequency of Social Gatherings with Friends and Family: Patient refused    Attends Religious Services: Patient refused    Active Member of Clubs or  Organizations: Patient refused    Attends Archivist Meetings: Patient refused    Marital Status: Patient refused  Intimate Partner Violence: Unknown (05/30/2018)   Humiliation, Afraid, Rape, and Kick questionnaire    Fear of Current or Ex-Partner: Patient refused    Emotionally Abused: Patient refused    Physically Abused: Patient refused    Sexually Abused: Patient refused   Family Status  Relation Name Status   Mother  Alive   Father  Deceased   Sister  Alive   Sister  Deceased   PGM  (Not Specified)   Cousin  (Not Specified)   Family  History  Problem Relation Age of Onset   Hypertension Mother    Thyroid disease Mother    Prostate cancer Father    AAA (abdominal aortic aneurysm) Sister    Breast cancer Paternal Grandmother 22   Breast cancer Cousin 52   Allergies  Allergen Reactions   Penicillins Rash    Did it involve swelling of the face/tongue/throat, SOB, or low BP? No Did it involve sudden or severe rash/hives, skin peeling, or any reaction on the inside of your mouth or nose? No Did you need to seek medical attention at a hospital or doctor's office? No When did it last happen?     childhood allergy  If all above answers are "NO", may proceed with cephalosporin use.     Patient Care Team: Virginia Crews, MD as PCP - General (Family Medicine)   Medications: Outpatient Medications Prior to Visit  Medication Sig   amLODipine (NORVASC) 10 MG tablet Take 1 tablet (10 mg total) by mouth daily.   cetirizine (ZYRTEC) 10 MG tablet Take 1 tablet (10 mg total) by mouth daily.   colchicine 0.6 MG tablet Take 1.'2mg'$  PO x1 at first sign of gout flare, then 0.'6mg'$  1 hour later. Continue 0.'6mg'$  BID until 48 hours after resolution of flare.   fluticasone (FLONASE) 50 MCG/ACT nasal spray Place 2 sprays into both nostrils daily.   hydrochlorothiazide (HYDRODIURIL) 25 MG tablet Take 1 tablet (25 mg total) by mouth daily.   losartan (COZAAR) 100 MG tablet Take 1 tablet (100 mg total) by mouth daily.   rosuvastatin (CRESTOR) 5 MG tablet Take 1 tablet (5 mg total) by mouth daily.   No facility-administered medications prior to visit.    Review of Systems  Constitutional:  Negative for fatigue and fever.  Respiratory:  Negative for cough and shortness of breath.   Cardiovascular:  Negative for chest pain and leg swelling.  Gastrointestinal:  Negative for abdominal pain.  Neurological:  Negative for dizziness and headaches.     Objective    BP 103/72 (BP Location: Left Arm, Patient Position: Sitting, Cuff Size:  Normal)   Pulse 76   Temp 98.3 F (36.8 C) (Oral)   Wt 230 lb (104.3 kg)   SpO2 98%   BMI 36.02 kg/m    Physical Exam Constitutional:      General: She is awake.     Appearance: She is well-developed. She is not ill-appearing.  HENT:     Head: Normocephalic.     Right Ear: Tympanic membrane normal.     Left Ear: Tympanic membrane normal.     Nose: Nose normal. No congestion or rhinorrhea.     Mouth/Throat:     Pharynx: No oropharyngeal exudate or posterior oropharyngeal erythema.  Eyes:     Conjunctiva/sclera: Conjunctivae normal.     Pupils: Pupils are equal, round, and reactive to  light.  Neck:     Thyroid: No thyroid mass or thyromegaly.  Cardiovascular:     Rate and Rhythm: Normal rate and regular rhythm.     Heart sounds: Normal heart sounds.  Pulmonary:     Effort: Pulmonary effort is normal.     Breath sounds: Normal breath sounds.  Abdominal:     Palpations: Abdomen is soft.     Tenderness: There is no abdominal tenderness.  Musculoskeletal:     Right lower leg: No swelling. No edema.     Left lower leg: No swelling. No edema.  Lymphadenopathy:     Cervical: No cervical adenopathy.  Skin:    General: Skin is warm.  Neurological:     Mental Status: She is alert and oriented to person, place, and time.  Psychiatric:        Attention and Perception: Attention normal.        Mood and Affect: Mood normal.        Speech: Speech normal.        Behavior: Behavior normal. Behavior is cooperative.     Last depression screening scores    06/03/2022    3:48 PM 11/09/2021    3:40 PM 05/04/2021   10:24 AM  PHQ 2/9 Scores  PHQ - 2 Score 0 0 0  PHQ- 9 Score 0     Last fall risk screening    06/03/2022    3:48 PM  Fall Risk   Falls in the past year? 0  Number falls in past yr: 0  Injury with Fall? 0   Last Audit-C alcohol use screening    06/03/2022    3:48 PM  Alcohol Use Disorder Test (AUDIT)  1. How often do you have a drink containing alcohol? 3   2. How many drinks containing alcohol do you have on a typical day when you are drinking? 0  3. How often do you have six or more drinks on one occasion? 2  AUDIT-C Score 5   A score of 3 or more in women, and 4 or more in men indicates increased risk for alcohol abuse, EXCEPT if all of the points are from question 1   No results found for any visits on 06/03/22.  Assessment & Plan    Routine Health Maintenance and Physical Exam  Exercise Activities and Dietary recommendations --balanced diet high in fiber and protein, low in sugars, carbs, fats. --physical activity/exercise 30 minutes 3-5 times a week     Immunization History  Administered Date(s) Administered   Influenza Split 05/15/2010   Influenza,inj,Quad PF,6+ Mos 06/03/2022   PFIZER(Purple Top)SARS-COV-2 Vaccination 10/12/2019, 11/05/2019   PPD Test 01/05/2017   Tdap 01/28/2020   Zoster Recombinat (Shingrix) 06/03/2022    Health Maintenance  Topic Date Due   COVID-19 Vaccine (3 - Pfizer risk series) 12/03/2019   MAMMOGRAM  04/15/2022   Zoster Vaccines- Shingrix (2 of 2) 07/29/2022   COLONOSCOPY (Pts 45-64yr Insurance coverage will need to be confirmed)  05/31/2023   TETANUS/TDAP  01/27/2030   INFLUENZA VACCINE  Completed   Hepatitis C Screening  Completed   HIV Screening  Completed   HPV VACCINES  Aged Out    Discussed health benefits of physical activity, and encouraged her to engage in regular exercise appropriate for her age and condition.  Problem List Items Addressed This Visit       Cardiovascular and Mediastinum   Essential (primary) hypertension    Chronic and well controlled continue current medications  Ordered cmp F/u 6 mo      Relevant Orders   Comprehensive metabolic panel     Other   Gout    Last flare 6 mo ago takes colchicine PRN      Relevant Orders   CBC with Differential/Platelet   Hypercholesterolemia without hypertriglyceridemia    Repeat fasting lipids for stability Managed  w/ crestor 5 mg The 10-year ASCVD risk score (Arnett DK, et al., 2019) is: 3.9%       Relevant Orders   Comprehensive metabolic panel   Lipid Panel With LDL/HDL Ratio   Other Visit Diagnoses     Annual physical exam    -  Primary   Relevant Orders   TSH   Need for influenza vaccination       Relevant Orders   Flu Vaccine QUAD 6+ mos PF IM (Fluarix Quad PF) (Completed)   Need for zoster vaccination       Relevant Orders   Zoster Recombinant (Shingrix ) (Completed)      Mammogram completed today. Pt amenable to shingles and influenza same day. Will schedule f/u for second shingles vaccine.  Return in about 6 months (around 12/03/2022) for hypertension.     I, Mikey Kirschner, PA-C have reviewed all documentation for this visit. The documentation on  06/03/2022 for the exam, diagnosis, procedures, and orders are all accurate and complete.  Mikey Kirschner, PA-C [facility] [address] [place], [place], [address] Office: [phone] Fax: [place]

## 2022-06-05 LAB — COMPREHENSIVE METABOLIC PANEL
ALT: 23 IU/L (ref 0–32)
AST: 19 IU/L (ref 0–40)
Albumin/Globulin Ratio: 2.1 (ref 1.2–2.2)
Albumin: 4.8 g/dL (ref 3.8–4.9)
Alkaline Phosphatase: 69 IU/L (ref 44–121)
BUN/Creatinine Ratio: 16 (ref 9–23)
BUN: 30 mg/dL — ABNORMAL HIGH (ref 6–24)
Bilirubin Total: 0.3 mg/dL (ref 0.0–1.2)
CO2: 22 mmol/L (ref 20–29)
Calcium: 10.3 mg/dL — ABNORMAL HIGH (ref 8.7–10.2)
Chloride: 101 mmol/L (ref 96–106)
Creatinine, Ser: 1.85 mg/dL — ABNORMAL HIGH (ref 0.57–1.00)
Globulin, Total: 2.3 g/dL (ref 1.5–4.5)
Glucose: 104 mg/dL — ABNORMAL HIGH (ref 70–99)
Potassium: 4 mmol/L (ref 3.5–5.2)
Sodium: 143 mmol/L (ref 134–144)
Total Protein: 7.1 g/dL (ref 6.0–8.5)
eGFR: 31 mL/min/{1.73_m2} — ABNORMAL LOW (ref >59)

## 2022-06-05 LAB — LIPID PANEL WITH LDL/HDL RATIO
Cholesterol, Total: 175 mg/dL (ref 100–199)
HDL: 46 mg/dL (ref >39)
LDL Chol Calc (NIH): 110 mg/dL — ABNORMAL HIGH (ref 0–99)
LDL/HDL Ratio: 2.4 ratio (ref 0.0–3.2)
Triglycerides: 102 mg/dL (ref 0–149)
VLDL Cholesterol Cal: 19 mg/dL (ref 5–40)

## 2022-06-05 LAB — CBC WITH DIFFERENTIAL/PLATELET
Basophils Absolute: 0.1 10*3/uL (ref 0.0–0.2)
Basos: 1 %
EOS (ABSOLUTE): 0.2 10*3/uL (ref 0.0–0.4)
Eos: 2 %
Hematocrit: 38.4 % (ref 34.0–46.6)
Hemoglobin: 12.8 g/dL (ref 11.1–15.9)
Immature Grans (Abs): 0 10*3/uL (ref 0.0–0.1)
Immature Granulocytes: 0 %
Lymphocytes Absolute: 1 10*3/uL (ref 0.7–3.1)
Lymphs: 13 %
MCH: 30.2 pg (ref 26.6–33.0)
MCHC: 33.3 g/dL (ref 31.5–35.7)
MCV: 91 fL (ref 79–97)
Monocytes Absolute: 0.6 10*3/uL (ref 0.1–0.9)
Monocytes: 7 %
Neutrophils Absolute: 6 10*3/uL (ref 1.4–7.0)
Neutrophils: 77 %
Platelets: 303 10*3/uL (ref 150–450)
RBC: 4.24 x10E6/uL (ref 3.77–5.28)
RDW: 13.1 % (ref 11.7–15.4)
WBC: 7.9 10*3/uL (ref 3.4–10.8)

## 2022-06-05 LAB — TSH: TSH: 0.5 u[IU]/mL (ref 0.450–4.500)

## 2022-06-07 ENCOUNTER — Other Ambulatory Visit: Payer: Self-pay | Admitting: Physician Assistant

## 2022-06-07 ENCOUNTER — Other Ambulatory Visit: Payer: Self-pay

## 2022-06-07 DIAGNOSIS — N179 Acute kidney failure, unspecified: Secondary | ICD-10-CM

## 2022-06-07 DIAGNOSIS — N289 Disorder of kidney and ureter, unspecified: Secondary | ICD-10-CM

## 2022-06-12 LAB — COMPREHENSIVE METABOLIC PANEL
ALT: 22 IU/L (ref 0–32)
AST: 16 IU/L (ref 0–40)
Albumin/Globulin Ratio: 1.7 (ref 1.2–2.2)
Albumin: 4.5 g/dL (ref 3.8–4.9)
Alkaline Phosphatase: 70 IU/L (ref 44–121)
BUN/Creatinine Ratio: 15 (ref 9–23)
BUN: 14 mg/dL (ref 6–24)
Bilirubin Total: 0.3 mg/dL (ref 0.0–1.2)
CO2: 25 mmol/L (ref 20–29)
Calcium: 9.9 mg/dL (ref 8.7–10.2)
Chloride: 100 mmol/L (ref 96–106)
Creatinine, Ser: 0.91 mg/dL (ref 0.57–1.00)
Globulin, Total: 2.7 g/dL (ref 1.5–4.5)
Glucose: 110 mg/dL — ABNORMAL HIGH (ref 70–99)
Potassium: 3.7 mmol/L (ref 3.5–5.2)
Sodium: 142 mmol/L (ref 134–144)
Total Protein: 7.2 g/dL (ref 6.0–8.5)
eGFR: 73 mL/min/{1.73_m2} (ref >59)

## 2022-06-14 ENCOUNTER — Other Ambulatory Visit: Payer: Self-pay

## 2022-06-14 DIAGNOSIS — I10 Essential (primary) hypertension: Secondary | ICD-10-CM

## 2022-06-14 DIAGNOSIS — N289 Disorder of kidney and ureter, unspecified: Secondary | ICD-10-CM

## 2022-06-21 ENCOUNTER — Ambulatory Visit: Payer: Managed Care, Other (non HMO) | Admitting: Family Medicine

## 2022-07-06 ENCOUNTER — Ambulatory Visit: Payer: Managed Care, Other (non HMO) | Admitting: Urology

## 2022-07-06 ENCOUNTER — Encounter: Payer: Self-pay | Admitting: Urology

## 2022-07-06 ENCOUNTER — Other Ambulatory Visit
Admission: RE | Admit: 2022-07-06 | Discharge: 2022-07-06 | Disposition: A | Discharge status: Home / Self Care | Payer: Managed Care, Other (non HMO) | Attending: Urology | Admitting: Urology

## 2022-07-06 ENCOUNTER — Other Ambulatory Visit: Payer: Self-pay | Admitting: *Deleted

## 2022-07-06 VITALS — BP 120/80 | HR 76 | Ht 67.5 in | Wt 230.0 lb

## 2022-07-06 DIAGNOSIS — D494 Neoplasm of unspecified behavior of bladder: Secondary | ICD-10-CM | POA: Diagnosis present

## 2022-07-06 DIAGNOSIS — Z8551 Personal history of malignant neoplasm of bladder: Secondary | ICD-10-CM | POA: Diagnosis not present

## 2022-07-06 LAB — URINALYSIS, COMPLETE (UACMP) WITH MICROSCOPIC
Bilirubin Urine: NEGATIVE
Glucose, UA: NEGATIVE mg/dL
Ketones, ur: NEGATIVE mg/dL
Leukocytes,Ua: NEGATIVE
Nitrite: NEGATIVE
Protein, ur: NEGATIVE mg/dL
Specific Gravity, Urine: 1.015 (ref 1.005–1.030)
pH: 6 (ref 5.0–8.0)

## 2022-07-06 MED ORDER — LIDOCAINE HCL URETHRAL/MUCOSAL 2 % EX GEL
1.0000 | Freq: Once | CUTANEOUS | Status: AC
  Administered 2022-07-06: 1 via URETHRAL

## 2022-07-06 NOTE — Progress Notes (Signed)
Bladder cancer surveillance note   UROLOGIC HISTORY Christina Holloway is a 61 y.o. 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  She underwent TURBT on 08/10/2019 with pathology showing high-grade T1 with no muscle invasion, and repeat TURBT on 09/14/2019 showed no evidence of malignancy.  She completed induction BCG in April 2021.   She is a never smoker.   Initial Diagnosis of Bladder  Year: 07/2019 Pathology: High-grade T1, muscle present and not involved   Recurrent Bladder Cancer Diagnosis None   Treatments for Bladder Cancer 08/10/2019: Initial TURBT 3 cm, HG T1 09/14/2019: 2nd look TURBT, pathology negative 3-11/2019: Induction BCG x6 03/2020: mBCG #1 06/2020: mBCG #2 12/2020: mBCG #3 06/2021: mBCG #4 01/2022: mBCG #5     AUA Risk Category High   Cystoscopy Procedure Note: After informed consent and discussion of the procedure and its risks, Armentha A Ozdemir was positioned and prepped in the standard fashion. Cystoscopy was performed with the a flexible cystoscope. The urethra, bladder neck and entire bladder was visualized in a standard fashion, and mucosa was normal throughout.  Scar right lateral wall with some very subtle erythema adjacent.  The ureteral orifices were visualized in their normal location and orientation.  No abnormalities on retroflexion.  Cytology sent.  CT urogram March 2023 with no urologic abnormalities   Follow-up cytology-> if negative/atypical mBCG in December and cystoscopy in May If cytology is suspicious or positive, will schedule bladder biopsy/fulguration  Nickolas Madrid, MD 07/06/2022

## 2022-07-06 NOTE — Addendum Note (Signed)
Addended by: Donalee Citrin on: 07/06/2022 03:20 PM   Modules accepted: Orders

## 2022-07-08 LAB — CYTOLOGY - NON PAP

## 2022-07-21 ENCOUNTER — Telehealth: Payer: Self-pay

## 2022-07-21 NOTE — Telephone Encounter (Signed)
Pt scheduled for 3 mBCG visits, pt confirmed.

## 2022-07-21 NOTE — Telephone Encounter (Signed)
-----   Message from Evelina Bucy, [place] sent at 07/08/2022  3:52 PM EST -----  Please schedule maintenance BCG x3 in December, followed by next cystoscopy with me in May 2024

## 2022-07-27 NOTE — Progress Notes (Unsigned)
BCG Bladder Instillation  BCG # 1/3  Due to Bladder Cancer patient is present today for a BCG treatment. Patient was cleaned and prepped in a sterile fashion with betadine. A 14 FR catheter was inserted, urine return was noted 20 ml, urine was yellow clear in color.  50ml of reconstituted BCG was instilled into the bladder. The catheter was then removed. Patient tolerated well, no complications were noted  Performed by: Michiel Cowboy, PA-C and Lizbeth Bark, CMA   Follow up/ Additional notes: one week for #2/2 mBCG

## 2022-07-28 ENCOUNTER — Ambulatory Visit (INDEPENDENT_AMBULATORY_CARE_PROVIDER_SITE_OTHER): Payer: Managed Care, Other (non HMO) | Admitting: Urology

## 2022-07-28 DIAGNOSIS — D494 Neoplasm of unspecified behavior of bladder: Secondary | ICD-10-CM

## 2022-07-28 DIAGNOSIS — C672 Malignant neoplasm of lateral wall of bladder: Secondary | ICD-10-CM | POA: Diagnosis not present

## 2022-07-28 MED ORDER — BCG LIVE 50 MG IS SUSR
3.2400 mL | Freq: Once | INTRAVESICAL | Status: AC
  Administered 2022-07-28: 81 mg via INTRAVESICAL

## 2022-07-28 NOTE — Addendum Note (Signed)
Addended by: Evelina Bucy on: 07/28/2022 04:00 PM   Modules accepted: Orders

## 2022-07-29 LAB — URINALYSIS, COMPLETE
Bilirubin, UA: NEGATIVE
Glucose, UA: NEGATIVE
Ketones, UA: NEGATIVE
Nitrite, UA: NEGATIVE
Specific Gravity, UA: 1.025 (ref 1.005–1.030)
Urobilinogen, Ur: 0.2 mg/dL (ref 0.2–1.0)
pH, UA: 5.5 (ref 5.0–7.5)

## 2022-07-29 LAB — MICROSCOPIC EXAMINATION

## 2022-08-03 NOTE — Progress Notes (Unsigned)
BCG Bladder Instillation  BCG # 2/3  Due to Bladder Cancer patient is present today for a BCG treatment. Patient was cleaned and prepped in a sterile fashion with betadine. A 14 FR catheter was inserted, urine return was noted 10 ml, urine was yellow  in color.  34m of reconstituted BCG was instilled into the bladder. The catheter was then removed. Patient tolerated well, no complications were noted  Performed by: SZara Council PA-C and CEvelina Bucy CMA   Follow up/ Additional notes: One week for #3/3

## 2022-08-04 ENCOUNTER — Ambulatory Visit (INDEPENDENT_AMBULATORY_CARE_PROVIDER_SITE_OTHER): Payer: Managed Care, Other (non HMO) | Admitting: Urology

## 2022-08-04 DIAGNOSIS — C672 Malignant neoplasm of lateral wall of bladder: Secondary | ICD-10-CM

## 2022-08-04 LAB — URINALYSIS, COMPLETE
Bilirubin, UA: NEGATIVE
Glucose, UA: NEGATIVE
Ketones, UA: NEGATIVE
Leukocytes,UA: NEGATIVE
Nitrite, UA: NEGATIVE
Specific Gravity, UA: 1.03 (ref 1.005–1.030)
Urobilinogen, Ur: 0.2 mg/dL (ref 0.2–1.0)
pH, UA: 6 (ref 5.0–7.5)

## 2022-08-04 LAB — MICROSCOPIC EXAMINATION

## 2022-08-04 MED ORDER — BCG LIVE 50 MG IS SUSR
3.2400 mL | Freq: Once | INTRAVESICAL | Status: AC
  Administered 2022-08-04: 81 mg via INTRAVESICAL

## 2022-08-05 ENCOUNTER — Ambulatory Visit: Payer: Managed Care, Other (non HMO) | Admitting: Physician Assistant

## 2022-08-05 DIAGNOSIS — Z23 Encounter for immunization: Secondary | ICD-10-CM | POA: Diagnosis not present

## 2022-08-10 NOTE — Progress Notes (Unsigned)
BCG Bladder Instillation  BCG # 3/3  Due to Bladder Cancer patient is present today for a BCG treatment. Patient was cleaned and prepped in a sterile fashion with betadine. A 14 FR catheter was inserted, urine return was noted 20 ml, urine was yellow in color.  63m of reconstituted BCG was instilled into the bladder. The catheter was then removed. Patient tolerated well, no complications were noted  Performed by: SZara Council PA-C and JGaspar Cola CMA  Follow up/ Additional notes: She will need a cysto in May w/ Dr. SDiamantina Providence

## 2022-08-11 ENCOUNTER — Ambulatory Visit (INDEPENDENT_AMBULATORY_CARE_PROVIDER_SITE_OTHER): Payer: Managed Care, Other (non HMO) | Admitting: Urology

## 2022-08-11 DIAGNOSIS — C672 Malignant neoplasm of lateral wall of bladder: Secondary | ICD-10-CM | POA: Diagnosis not present

## 2022-08-11 LAB — URINALYSIS, COMPLETE
Bilirubin, UA: NEGATIVE
Glucose, UA: NEGATIVE
Ketones, UA: NEGATIVE
Leukocytes,UA: NEGATIVE
Nitrite, UA: NEGATIVE
Specific Gravity, UA: 1.02 (ref 1.005–1.030)
Urobilinogen, Ur: 1 mg/dL (ref 0.2–1.0)
pH, UA: 7 (ref 5.0–7.5)

## 2022-08-11 LAB — MICROSCOPIC EXAMINATION

## 2022-08-11 MED ORDER — BCG LIVE 50 MG IS SUSR
3.2400 mL | Freq: Once | INTRAVESICAL | Status: AC
  Administered 2022-08-11: 81 mg via INTRAVESICAL

## 2022-08-26 ENCOUNTER — Telehealth: Payer: Managed Care, Other (non HMO) | Admitting: Family Medicine

## 2022-11-01 ENCOUNTER — Other Ambulatory Visit: Payer: Self-pay | Admitting: Physician Assistant

## 2022-11-01 DIAGNOSIS — Z1231 Encounter for screening mammogram for malignant neoplasm of breast: Secondary | ICD-10-CM

## 2022-11-30 ENCOUNTER — Other Ambulatory Visit: Payer: Self-pay | Admitting: Family Medicine

## 2022-11-30 DIAGNOSIS — I10 Essential (primary) hypertension: Secondary | ICD-10-CM

## 2022-12-01 ENCOUNTER — Other Ambulatory Visit: Payer: Self-pay

## 2022-12-01 MED ORDER — HYDROCHLOROTHIAZIDE 25 MG PO TABS: 25.0000 mg | ORAL_TABLET | Freq: Every day | ORAL | Status: DC

## 2023-01-14 ENCOUNTER — Other Ambulatory Visit: Payer: Self-pay

## 2023-01-14 DIAGNOSIS — Z8551 Personal history of malignant neoplasm of bladder: Secondary | ICD-10-CM

## 2023-01-18 ENCOUNTER — Encounter: Payer: Self-pay | Admitting: Urology

## 2023-01-18 ENCOUNTER — Ambulatory Visit (INDEPENDENT_AMBULATORY_CARE_PROVIDER_SITE_OTHER): Payer: Managed Care, Other (non HMO) | Admitting: Urology

## 2023-01-18 ENCOUNTER — Other Ambulatory Visit
Admission: RE | Admit: 2023-01-18 | Discharge: 2023-01-18 | Disposition: A | Discharge status: Home / Self Care | Payer: Managed Care, Other (non HMO) | Attending: Urology | Admitting: Urology

## 2023-01-18 VITALS — BP 142/86 | HR 74 | Ht 67.5 in | Wt 227.8 lb

## 2023-01-18 DIAGNOSIS — Z8551 Personal history of malignant neoplasm of bladder: Secondary | ICD-10-CM

## 2023-01-18 LAB — URINALYSIS, COMPLETE (UACMP) WITH MICROSCOPIC
Bilirubin Urine: NEGATIVE
Glucose, UA: NEGATIVE mg/dL
Ketones, ur: NEGATIVE mg/dL
Leukocytes,Ua: NEGATIVE
Nitrite: NEGATIVE
Protein, ur: NEGATIVE mg/dL
Specific Gravity, Urine: 1.02 (ref 1.005–1.030)
pH: 5.5 (ref 5.0–8.0)

## 2023-01-18 NOTE — Progress Notes (Signed)
Bladder cancer surveillance note   UROLOGIC HISTORY Christina Holloway is a 62 y.o. 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  She underwent TURBT on 08/10/2019 with pathology showing high-grade T1 with no muscle invasion, and repeat TURBT on 09/14/2019 showed no evidence of malignancy.  She completed induction BCG in April 2021, and maintenance BCG in December 2023   She is a never smoker.   Initial Diagnosis of Bladder  Year: 07/2019 Pathology: High-grade T1, muscle present and not involved   Recurrent Bladder Cancer Diagnosis None   Treatments for Bladder Cancer 08/10/2019: Initial TURBT 3 cm, HG T1 09/14/2019: 2nd look TURBT, pathology negative 3-11/2019: Induction BCG x6 03/2020: mBCG #1 06/2020: mBCG #2 12/2020: mBCG #3 06/2021: mBCG #4 01/2022: mBCG #5 06/2023:mBCG #6     AUA Risk Category High   Cystoscopy Procedure Note: After informed consent and discussion of the procedure and its risks, Christina Holloway was positioned and prepped in the standard fashion. Cystoscopy was performed with the a flexible cystoscope. The urethra, bladder neck and entire bladder was visualized in a standard fashion, and mucosa was normal throughout.  Scar right lateral wall.  The ureteral orifices were visualized in their normal location and orientation.  No abnormalities on retroflexion.  Cytology sent.  CT urogram March 2023 with no urologic abnormalities   Follow-up cytology-> if negative/atypical, RTC for cystoscopy in November 2024, then space to yearly cystoscopy.  Due for CT urogram March 2025  Legrand Rams, MD 01/18/2023

## 2023-03-15 ENCOUNTER — Other Ambulatory Visit: Payer: Self-pay | Admitting: Family Medicine

## 2023-03-15 DIAGNOSIS — I10 Essential (primary) hypertension: Secondary | ICD-10-CM

## 2023-03-16 NOTE — Telephone Encounter (Signed)
Called pt and made appt/30 day courtesy refill given  Requested Prescriptions  Pending Prescriptions Disp Refills   amLODipine (NORVASC) 10 MG tablet [Pharmacy Med Name: AMLODIPINE BESYLATE 10 MG TAB] 30 tablet 0    Sig: TAKE 1 TABLET BY MOUTH DAILY     Cardiovascular: Calcium Channel Blockers 2 Failed - 03/15/2023 12:42 PM      Failed - Last BP in normal range    BP Readings from Last 1 Encounters:  01/18/23 (!) 142/86         Failed - Valid encounter within last 6 months    Recent Outpatient Visits           9 months ago Annual physical exam   [facility] Alfredia Ferguson, PA-C   1 year ago Essential (primary) hypertension   [place] [facility] [place], Marzella Schlein, MD   1 year ago Annual physical exam   [place] [facility] Bosie Clos, MD   2 years ago Essential (primary) hypertension   [place] [facility] [place], Marzella Schlein, MD   2 years ago COVID-19   [place] [facility] [place], Marzella Schlein, MD       Future Appointments             In 5 days Bacigalupo, Marzella Schlein, MD [facility]            Passed - Last Heart Rate in normal range    Pulse Readings from Last 1 Encounters:  01/18/23 74          rosuvastatin (CRESTOR) 5 MG tablet [Pharmacy Med Name: ROSUVASTATIN CALCIUM 5 MG TAB] 30 tablet 0    Sig: TAKE 1 TABLET BY MOUTH DAILY     Cardiovascular:  Antilipid - Statins 2 Failed - 03/15/2023 12:42 PM      Failed - Lipid Panel in normal range within the last 12 months    Cholesterol, Total  Date Value Ref Range Status  06/04/2022 175 100 - 199 mg/dL Final   LDL Chol Calc (NIH)  Date Value Ref Range Status  06/04/2022 110 (H) 0 - 99 mg/dL Final   HDL  Date Value Ref Range Status  06/04/2022 46 >39 mg/dL Final   Triglycerides  Date Value Ref Range Status  06/04/2022 102 0 - 149 mg/dL Final         Passed - Cr in  normal range and within 360 days    Creatinine  Date Value Ref Range Status  11/27/2014 0.75 mg/dL Final    Comment:    9.60-4.54 NOTE: New Reference Range  10/29/14    Creatinine, Ser  Date Value Ref Range Status  06/11/2022 0.91 0.57 - 1.00 mg/dL Final         Passed - Patient is not pregnant      Passed - Valid encounter within last 12 months    Recent Outpatient Visits           9 months ago Annual physical exam   [place] [facility] Alfredia Ferguson, PA-C   1 year ago Essential (primary) hypertension   [place] [facility] [place], Marzella Schlein, MD   1 year ago Annual physical exam   [place] [facility] Bosie Clos, MD   2 years ago Essential (primary) hypertension   [place] [facility] [place], Marzella Schlein, MD   2 years ago COVID-19  [facility] [place], Marzella Schlein, MD       Future Appointments             In 5 days Bacigalupo, Marzella Schlein, MD [facility]             losartan (COZAAR) 100 MG tablet [Pharmacy Med Name: LOSARTAN POTASSIUM 100 MG TAB] 30 tablet 0    Sig: TAKE 1 TABLET BY MOUTH DAILY     Cardiovascular:  Angiotensin Receptor Blockers Failed - 03/15/2023 12:42 PM      Failed - Cr in normal range and within 180 days    Creatinine  Date Value Ref Range Status  11/27/2014 0.75 mg/dL Final    Comment:    3.24-4.01 NOTE: New Reference Range  10/29/14    Creatinine, Ser  Date Value Ref Range Status  06/11/2022 0.91 0.57 - 1.00 mg/dL Final         Failed - K in normal range and within 180 days    Potassium  Date Value Ref Range Status  06/11/2022 3.7 3.5 - 5.2 mmol/L Final  11/27/2014 3.0 (L) mmol/L Final    Comment:    3.5-5.1 NOTE: New Reference Range  10/29/14          Failed - Last BP in normal range    BP Readings from Last 1 Encounters:  01/18/23 (!) 142/86         Failed - Valid  encounter within last 6 months    Recent Outpatient Visits           9 months ago Annual physical exam   [place] [facility] Alfredia Ferguson, PA-C   1 year ago Essential (primary) hypertension   [place] [facility] [place], Marzella Schlein, MD   1 year ago Annual physical exam   [place] [facility] Bosie Clos, MD   2 years ago Essential (primary) hypertension    [place] [facility] [place], Marzella Schlein, MD   2 years ago COVID-19   [facility] [place], Marzella Schlein, MD       Future Appointments             In 5 days Bacigalupo, Marzella Schlein, MD [facility]            Passed - Patient is not pregnant       hydrochlorothiazide (HYDRODIURIL) 25 MG tablet [Pharmacy Med Name: HYDROCHLOROTHIAZIDE 25 MG TAB] 30 tablet O    Sig: TAKE 1 TABLET BY MOUTH DAILY     Cardiovascular: Diuretics - Thiazide Failed - 03/15/2023 12:42 PM      Failed - Cr in normal range and within 180 days    Creatinine  Date Value Ref Range Status  11/27/2014 0.75 mg/dL Final    Comment:    0.27-2.53 NOTE: New Reference Range  10/29/14    Creatinine, Ser  Date Value Ref Range Status  06/11/2022 0.91 0.57 - 1.00 mg/dL Final         Failed - K in normal range and within 180 days    Potassium  Date Value Ref Range Status  06/11/2022 3.7 3.5 - 5.2 mmol/L Final  11/27/2014 3.0 (L) mmol/L Final    Comment:    3.5-5.1 NOTE: New Reference Range  10/29/14          Failed - Na in normal range and within 180 days  Sodium  Date Value Ref Range Status  06/11/2022 142 134 - 144 mmol/L Final  11/27/2014 142 mmol/L Final    Comment:    135-145 NOTE: New Reference Range  10/29/14          Failed - Last BP in normal range    BP Readings from Last 1 Encounters:  01/18/23 (!) 142/86         Failed - Valid encounter within last 6 months    Recent Outpatient Visits            9 months ago Annual physical exam    [facility] Alfredia Ferguson, PA-C   1 year ago Essential (primary) hypertension   [place] [facility] [place], Marzella Schlein, MD   1 year ago Annual physical exam   [place] [facility] Bosie Clos, MD   2 years ago Essential (primary) hypertension   [facility] [place], Marzella Schlein, MD   2 years ago COVID-19   [facility] [place], Marzella Schlein, MD       Future Appointments             In 5 days Bacigalupo, Marzella Schlein, MD [facility]

## 2023-03-17 ENCOUNTER — Other Ambulatory Visit: Payer: Self-pay | Admitting: Family Medicine

## 2023-03-17 NOTE — Telephone Encounter (Signed)
Medication was refilled 03/16/23, but no confirmation receipt from pharmacy, will resend.  Requested Prescriptions  Pending Prescriptions Disp Refills   hydrochlorothiazide (HYDRODIURIL) 25 MG tablet [Pharmacy Med Name: HYDROCHLOROTHIAZIDE 25 MG TAB] 30 tablet 0    Sig: TAKE 1 TABLET BY MOUTH DAILY     Cardiovascular: Diuretics - Thiazide Failed - 03/17/2023 11:18 AM      Failed - Cr in normal range and within 180 days    Creatinine  Date Value Ref Range Status  11/27/2014 0.75 mg/dL Final    Comment:    1.61-0.96 NOTE: New Reference Range  10/29/14    Creatinine, Ser  Date Value Ref Range Status  06/11/2022 0.91 0.57 - 1.00 mg/dL Final         Failed - K in normal range and within 180 days    Potassium  Date Value Ref Range Status  06/11/2022 3.7 3.5 - 5.2 mmol/L Final  11/27/2014 3.0 (L) mmol/L Final    Comment:    3.5-5.1 NOTE: New Reference Range  10/29/14          Failed - Na in normal range and within 180 days    Sodium  Date Value Ref Range Status  06/11/2022 142 134 - 144 mmol/L Final  11/27/2014 142 mmol/L Final    Comment:    135-145 NOTE: New Reference Range  10/29/14          Failed - Last BP in normal range    BP Readings from Last 1 Encounters:  01/18/23 (!) 142/86         Failed - Valid encounter within last 6 months    Recent Outpatient Visits           9 months ago Annual physical exam   [facility] Ferguson, PA-C   1 year ago Essential (primary) hypertension   [facility] [place], Marzella Schlein, MD   1 year ago Annual physical exam   [facility] Bosie Clos, MD   2 years ago Essential (primary) hypertension   [place] [facility] [place], Marzella Schlein, MD   2 years ago COVID-19   [facility] [place], Marzella Schlein, MD       Future Appointments             In 4 days Bacigalupo, Marzella Schlein, MD [facility]

## 2023-03-21 ENCOUNTER — Encounter: Payer: Self-pay | Admitting: Family Medicine

## 2023-03-21 ENCOUNTER — Ambulatory Visit: Payer: Managed Care, Other (non HMO) | Admitting: Family Medicine

## 2023-03-21 VITALS — BP 116/77 | HR 66 | Temp 98.2°F | Resp 12 | Ht 67.5 in | Wt 234.9 lb

## 2023-03-21 DIAGNOSIS — I10 Essential (primary) hypertension: Secondary | ICD-10-CM | POA: Diagnosis not present

## 2023-03-21 DIAGNOSIS — E7800 Pure hypercholesterolemia, unspecified: Secondary | ICD-10-CM

## 2023-03-21 DIAGNOSIS — R739 Hyperglycemia, unspecified: Secondary | ICD-10-CM

## 2023-03-21 DIAGNOSIS — D509 Iron deficiency anemia, unspecified: Secondary | ICD-10-CM

## 2023-03-21 NOTE — Assessment & Plan Note (Addendum)
Well controlled. Office BP was at goal at 116 / 77.  Continue Amlodipine 10  Hydrochlorothiazide 25 mg  Losartan 5 mg  CMP

## 2023-03-21 NOTE — Assessment & Plan Note (Signed)
Well controlled. Last CBC and iron level was wnl.  CTM CBC  Iron panel

## 2023-03-21 NOTE — Assessment & Plan Note (Signed)
New. Recent CMP showed elevated glucose levels. Will assess for stability of blood sugar over time.  HbgA1c

## 2023-03-21 NOTE — Assessment & Plan Note (Addendum)
Relatively well controlled. Last lipid panel LDL was elevated to 110.  CTM Encourage lifestyle changes Continue Rosuvastatin Lipid panel

## 2023-03-21 NOTE — Progress Notes (Signed)
Established Patient Office Visit  Subjective   Patient ID: Christina Holloway, female    DOB: [birth year]  Age: 62 y.o. MRN: [id number]  Chief Complaint  Patient presents with   Hypertension    Patient is here today for medical management of chronic conditions.  Her BP is well controlled. In office BP was at goal. She rarely takes at home BP measurements but when she does they are at goal.  She uses her colchicine prn for gout flare ups.  She is complaining of occasional joint pain and asked if she could take OTC vitamin D. Additionally, wanted to know if tylenol was safe for joint pain.    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  Musculoskeletal:  Positive for joint pain.  Neurological:  Negative for dizziness and headaches.      Objective:     BP 116/77 (BP Location: Left Arm, Patient Position: Sitting, Cuff Size: Large)   Pulse 66   Temp 98.2 F (36.8 C) (Temporal)   Resp 12   Ht 5' 7.5" (1.715 m)   Wt 234 lb 14.4 oz (106.5 kg)   SpO2 97%   BMI 36.25 kg/m  BP Readings from Last 3 Encounters:  03/21/23 116/77  01/18/23 (!) 142/86  07/06/22 120/80    Physical Exam Constitutional:      Appearance: Normal appearance.  Cardiovascular:     Rate and Rhythm: Normal rate and regular rhythm.     Heart sounds: Normal heart sounds.  Pulmonary:     Effort: Pulmonary effort is normal.     Breath sounds: Normal breath sounds.  Neurological:     General:  No focal deficit present.     Mental Status: She is alert and oriented to person, place, and time.     No results found for any visits on 03/21/23.  Last CBC Lab Results  Component Value Date   WBC 7.9 06/04/2022   HGB 12.8 06/04/2022   HCT 38.4 06/04/2022   MCV 91 06/04/2022   MCH 30.2 06/04/2022   RDW 13.1 06/04/2022   PLT 303 06/04/2022   Last metabolic panel Lab Results  Component Value Date   GLUCOSE 110 (H) 06/11/2022   NA 142 06/11/2022   K 3.7 06/11/2022   CL 100 06/11/2022   CO2 25 06/11/2022   BUN 14 06/11/2022   CREATININE 0.91 06/11/2022   EGFR 73 06/11/2022   CALCIUM 9.9 06/11/2022   PHOS 4.0 02/07/2017   PROT 7.2 06/11/2022   ALBUMIN 4.5 06/11/2022   LABGLOB 2.7 06/11/2022   AGRATIO 1.7 06/11/2022   BILITOT 0.3 06/11/2022   ALKPHOS 70 06/11/2022   AST 16 06/11/2022   ALT 22 06/11/2022   ANIONGAP 11 08/07/2019   Last lipids Lab Results  Component Value Date   CHOL 175 06/04/2022   HDL 46 06/04/2022   LDLCALC 110 (H) 06/04/2022   TRIG 102 06/04/2022   CHOLHDL 4.5 (H) 05/04/2021  The 10-year ASCVD risk score (Arnett DK, et al., 2019) is: 9%    Assessment & Plan:   Problem List Items Addressed This Visit       Cardiovascular and Mediastinum   Essential (primary) hypertension - Primary    Well controlled. Office BP was at goal at 116 / 77.  Continue Amlodipine 10  Hydrochlorothiazide 25 mg  Losartan 5 mg  CMP       Relevant Orders   Comprehensive metabolic panel     Other   Anemia, iron deficiency    Well controlled. Last CBC and iron level was wnl.  CTM CBC  Iron panel       Relevant Orders   CBC   Iron, TIBC and Ferritin Panel   Hypercholesterolemia without hypertriglyceridemia    Relatively well controlled. Last lipid panel LDL was elevated to 110.  CTM Encourage lifestyle changes Continue Rosuvastatin Lipid panel       Relevant Orders   Comprehensive metabolic panel   Lipid panel   Morbid obesity (HCC)    Hyperglycemia    New. Recent CMP showed elevated glucose levels. Will assess for stability of blood sugar over time.  HbgA1c      Relevant Orders   Hemoglobin A1c    Return in about 3 months (around 06/21/2023) for CPE.    Rometta Emery, Medical Student  Patient seen along with MS3 student Jodi Marble. I personally evaluated this patient along with the student, and verified all aspects of the history, physical exam, and medical decision making as documented by the student. I agree with the student's documentation and have made all necessary edits.  Katena Petitjean, Marzella Schlein, MD, MPH [facility]

## 2023-04-08 ENCOUNTER — Other Ambulatory Visit: Payer: Self-pay | Admitting: Family Medicine

## 2023-04-14 ENCOUNTER — Telehealth: Payer: Self-pay | Admitting: Family Medicine

## 2023-04-14 MED ORDER — CYCLOBENZAPRINE HCL 5 MG PO TABS: 5.0000 mg | ORAL_TABLET | Freq: Three times a day (TID) | ORAL | 0 refills | Status: AC | PRN

## 2023-04-14 NOTE — Telephone Encounter (Signed)
Medication was prescribed on 12/27/22 for muscle spasm by historical provider. Please advise.

## 2023-04-14 NOTE — Telephone Encounter (Signed)
Medication Refill - Medication: cyclobenzaprine (FLEXERIL) 5 MG tablet   Has the patient contacted their pharmacy? No. (Agent: If no, request that the patient contact the pharmacy for the refill. If patient does not wish to contact the pharmacy document the reason why and proceed with request.) (Agent: If yes, when and what did the pharmacy advise?)  Preferred Pharmacy (with phone number or street name):  [facility] - [place], [place] - [address] Phone: [phone]  Fax: [phone]     Has the patient been seen for an appointment in the last year OR does the patient have an upcoming appointment? Yes.    Agent: Please be advised that RX refills may take up to 3 business days. We ask that you follow-up with your pharmacy.

## 2023-04-14 NOTE — Telephone Encounter (Signed)
Patient request cyclobenzaprine (FLEXERIL) 5 MG tablet, medication is not on current medication list. Called patient to inquire why medication was needed, no answer. Left VM to call back. Routing for review.

## 2023-05-02 ENCOUNTER — Other Ambulatory Visit: Payer: Self-pay | Admitting: Family Medicine

## 2023-05-02 DIAGNOSIS — I10 Essential (primary) hypertension: Secondary | ICD-10-CM

## 2023-05-16 ENCOUNTER — Encounter: Payer: Managed Care, Other (non HMO) | Admitting: Family Medicine

## 2023-06-06 ENCOUNTER — Ambulatory Visit: Payer: Managed Care, Other (non HMO) | Admitting: Family Medicine

## 2023-06-06 ENCOUNTER — Other Ambulatory Visit: Payer: Self-pay | Admitting: Family Medicine

## 2023-06-06 ENCOUNTER — Encounter: Payer: Self-pay | Admitting: Family Medicine

## 2023-06-06 VITALS — BP 123/75 | HR 66 | Ht 67.0 in | Wt 233.0 lb

## 2023-06-06 DIAGNOSIS — E7800 Pure hypercholesterolemia, unspecified: Secondary | ICD-10-CM | POA: Diagnosis not present

## 2023-06-06 DIAGNOSIS — R600 Localized edema: Secondary | ICD-10-CM

## 2023-06-06 DIAGNOSIS — D509 Iron deficiency anemia, unspecified: Secondary | ICD-10-CM | POA: Diagnosis not present

## 2023-06-06 DIAGNOSIS — Z23 Encounter for immunization: Secondary | ICD-10-CM | POA: Diagnosis not present

## 2023-06-06 DIAGNOSIS — Z0001 Encounter for general adult medical examination with abnormal findings: Secondary | ICD-10-CM | POA: Diagnosis not present

## 2023-06-06 DIAGNOSIS — I10 Essential (primary) hypertension: Secondary | ICD-10-CM

## 2023-06-06 DIAGNOSIS — R0609 Other forms of dyspnea: Secondary | ICD-10-CM

## 2023-06-06 DIAGNOSIS — Z1211 Encounter for screening for malignant neoplasm of colon: Secondary | ICD-10-CM

## 2023-06-06 DIAGNOSIS — R7303 Prediabetes: Secondary | ICD-10-CM | POA: Diagnosis not present

## 2023-06-06 DIAGNOSIS — R739 Hyperglycemia, unspecified: Secondary | ICD-10-CM

## 2023-06-06 DIAGNOSIS — Z0000 Encounter for general adult medical examination without abnormal findings: Secondary | ICD-10-CM

## 2023-06-06 NOTE — Progress Notes (Signed)
Complete physical exam  Patient: Christina Holloway   DOB: March 13, 1961   61 y.o. Female  MRN: [id number]  Subjective:    Chief Complaint  Patient presents with   Annual Exam    Diet - General well balanced Exercise - Walking 2 to 3 times a week for 30 minutes Feeling - Well Sleeping - Fairly well due to tingling in legs bilaterally some nights Concerns - swelling in ankles at times and SOB when walking   Flu Vaccine    Would like administered in left arm     Christina Holloway is a 62 y.o. female who presents today for a complete physical exam.   Discussed the use of AI scribe software for clinical note transcription with the patient, who gave verbal consent to proceed.  History of Present Illness   The patient, with a history of hypertension and prediabetes, presents for a routine physical examination. She reports experiencing leg swelling, particularly in the ankles, which tends to worsen throughout the day. The patient spends most of the day seated, driving for work, and is not on her feet much. She is currently on amlodipine, hydrochlorothiazide, and losartan for blood pressure control.  In addition to the leg swelling, the patient also reports occasional shortness of breath, particularly when active or walking uphill. She acknowledges that this could be due to being overweight. She denies any associated chest pain.  The patient also mentions discomfort in her legs, which she attributes to potential sciatica. She reports that her legs tend to be more sore at the end of the day.  The patient's blood sugar levels have been noted to be in the prediabetic range, and she expresses a desire to lose weight. She acknowledges the need for dietary modifications, particularly reducing carbohydrate intake.        Most recent fall risk assessment:    06/06/2023    9:24 AM  Fall Risk   Falls in the past year? 0  Injury with Fall? 0  Risk for fall due to : No Fall Risks  Follow up Falls evaluation  completed     Most recent depression screenings:    06/06/2023    9:24 AM 03/21/2023    8:13 AM  PHQ 2/9 Scores  PHQ - 2 Score 0 0        Patient Care Team: Erasmo Downer, MD as PCP - General (Family Medicine)   Outpatient Medications Prior to Visit  Medication Sig   amLODipine (NORVASC) 10 MG tablet TAKE 1 TABLET BY MOUTH DAILY   cetirizine (ZYRTEC) 10 MG tablet Take 1 tablet (10 mg total) by mouth daily.   colchicine 0.6 MG tablet TAKE 2 TABLETS BY MOUTH AT FIRST SIGN OF GOUT FLARE. THEN TAKE 1 TABLET 1 HOURLATER. CONTINUE 1 TABLET TWICE A DAY UNTIL 48 HOURS AFTER RESOLUTION OF FLARE   cyclobenzaprine (FLEXERIL) 5 MG tablet Take 1 tablet (5 mg total) by mouth 3 (three) times daily as needed for muscle spasms.   fluticasone (FLONASE) 50 MCG/ACT nasal spray Place 2 sprays into both nostrils daily.   hydrochlorothiazide (HYDRODIURIL) 25 MG tablet TAKE 1 TABLET BY MOUTH DAILY   losartan (COZAAR) 100 MG tablet TAKE 1 TABLET BY MOUTH DAILY   rosuvastatin (CRESTOR) 5 MG tablet TAKE 1 TABLET BY MOUTH DAILY   No facility-administered medications prior to visit.    ROS        Objective:     BP 123/75   Pulse 66  Ht 5\' 7"  (1.702 m)   Wt 233 lb (105.7 kg)   SpO2 99%   BMI 36.49 kg/m    Physical Exam Vitals reviewed.  Constitutional:      General: She is not in acute distress.    Appearance: Normal appearance. She is well-developed. She is not diaphoretic.  HENT:     Head: Normocephalic and atraumatic.     Right Ear: Tympanic membrane, ear canal and external ear normal.     Left Ear: Tympanic membrane, ear canal and external ear normal.     Nose: Nose normal.     Mouth/Throat:     Mouth: Mucous membranes are moist.     Pharynx: Oropharynx is clear. No oropharyngeal exudate.  Eyes:     General: No scleral icterus.    Conjunctiva/sclera: Conjunctivae normal.     Pupils: Pupils are equal, round, and reactive to light.  Neck:     Thyroid: No thyromegaly.   Cardiovascular:     Rate and Rhythm: Normal rate and regular rhythm.     Heart sounds: Normal heart sounds. No murmur heard. Pulmonary:     Effort: Pulmonary effort is normal. No respiratory distress.     Breath sounds: Normal breath sounds. No wheezing or rales.  Abdominal:     General: There is no distension.     Palpations: Abdomen is soft.     Tenderness: There is no abdominal tenderness.  Musculoskeletal:        General: No deformity.     Cervical back: Neck supple.     Right lower leg: Edema (trace) present.     Left lower leg: Edema (trace) present.  Lymphadenopathy:     Cervical: No cervical adenopathy.  Skin:    General: Skin is warm and dry.     Findings: No rash.  Neurological:     Mental Status: She is alert and oriented to person, place, and time. Mental status is at baseline.     Gait: Gait normal.  Psychiatric:        Mood and Affect: Mood normal.        Behavior: Behavior normal.        Thought Content: Thought content normal.      No results found for any visits on 06/06/23.     Assessment & Plan:    Routine Health Maintenance and Physical Exam  Immunization History  Administered Date(s) Administered   Influenza Split 05/15/2010   Influenza, Seasonal, Injecte, Preservative Fre 06/06/2023   Influenza,inj,Quad PF,6+ Mos 06/03/2022   PFIZER(Purple Top)SARS-COV-2 Vaccination 10/12/2019, 11/05/2019   PPD Test 01/05/2017   Tdap 01/28/2020   Zoster Recombinant(Shingrix) 06/03/2022, 08/05/2022    Health Maintenance  Topic Date Due   COVID-19 Vaccine (3 - Pfizer risk series) 12/03/2019   Colonoscopy  05/31/2023   MAMMOGRAM  06/04/2023   DTaP/Tdap/Td (2 - Td or Tdap) 01/27/2030   INFLUENZA VACCINE  Completed   Hepatitis C Screening  Completed   HIV Screening  Completed   Zoster Vaccines- Shingrix  Completed   HPV VACCINES  Aged Out    Discussed health benefits of physical activity, and encouraged her to engage in regular exercise appropriate for  her age and condition.  Problem List Items Addressed This Visit       Cardiovascular and Mediastinum   Essential (primary) hypertension     Other   Anemia, iron deficiency   Hypercholesterolemia without hypertriglyceridemia   Morbid obesity (HCC)   Hyperglycemia   Other Visit Diagnoses  Encounter for annual physical exam    -  Primary   Immunization due       Relevant Orders   Flu vaccine trivalent PF, 6mos and older(Flulaval,Afluria,Fluarix,Fluzone) (Completed)   Influenza vaccine needed       Relevant Orders   Flu vaccine trivalent PF, 6mos and older(Flulaval,Afluria,Fluarix,Fluzone) (Completed)   Screening for colon cancer       Relevant Orders   Ambulatory referral to Gastroenterology   Lower extremity edema               Lower extremity edema Noted during the day, especially with prolonged sitting. Currently on Amlodipine 10mg  daily, which may contribute to edema. -Try compression socks during work hours. -Consider medication adjustment if no improvement with compression socks.  Shortness of breath on exertion Noted with uphill walking, no associated chest pain. Lungs clear on auscultation. -Encourage gradual increase in exercise. -Consider cardiology referral and/or possible Echo if symptoms persist or worsen.  Prediabetes A1c in prediabetic range on labs from July 2024. -Encourage low carbohydrate diet and portion control. -Plan to recheck labs at 32-month follow-up.  General Health Maintenance -Flu shot administered today. -Mammogram scheduled for Wednesday. -Colonoscopy referral sent, due for screening based on last colonoscopy 5 years ago. -Schedule 80-month follow-up appointment.        Return in about 6 months (around 12/05/2023) for chronic disease f/u.     Shirlee Latch, MD

## 2023-06-07 NOTE — Telephone Encounter (Signed)
Requested Prescriptions  Pending Prescriptions Disp Refills   amLODipine (NORVASC) 10 MG tablet [Pharmacy Med Name: AMLODIPINE BESYLATE 10 MG TAB] 90 tablet 2    Sig: TAKE 1 TABLET BY MOUTH DAILY     Cardiovascular: Calcium Channel Blockers 2 Passed - 06/06/2023  5:44 PM      Passed - Last BP in normal range    BP Readings from Last 1 Encounters:  06/06/23 123/75         Passed - Last Heart Rate in normal range    Pulse Readings from Last 1 Encounters:  06/06/23 66         Passed - Valid encounter within last 6 months    Recent Outpatient Visits           Yesterday Encounter for annual physical exam   [place] [facility] [place], Marzella Schlein, MD   2 months ago Essential (primary) hypertension   [place] [facility] [place], Marzella Schlein, MD   1 year ago Annual physical exam   [place] [facility] Alfredia Ferguson, PA-C   1 year ago Essential (primary) hypertension   [place] [facility] [place], Marzella Schlein, MD   2 years ago Annual physical exam   [facility] Bosie Clos, MD       Future Appointments             In 6 months Bacigalupo, Marzella Schlein, MD [place] [facility]             losartan (COZAAR) 100 MG tablet [Pharmacy Med Name: LOSARTAN POTASSIUM 100 MG TAB] 90 tablet 2    Sig: TAKE 1 TABLET BY MOUTH DAILY     Cardiovascular:  Angiotensin Receptor Blockers Passed - 06/06/2023  5:44 PM      Passed - Cr in normal range and within 180 days    Creatinine  Date Value Ref Range Status  11/27/2014 0.75 mg/dL Final    Comment:    1.61-0.96 NOTE: New Reference Range  10/29/14    Creatinine, Ser  Date Value Ref Range Status  03/21/2023 0.81 0.57 - 1.00 mg/dL Final         Passed - K in normal range and within 180 days    Potassium  Date Value Ref Range Status  03/21/2023 4.2 3.5 - 5.2 mmol/L Final  11/27/2014 3.0 (L) mmol/L Final     Comment:    3.5-5.1 NOTE: New Reference Range  10/29/14          Passed - Patient is not pregnant      Passed - Last BP in normal range    BP Readings from Last 1 Encounters:  06/06/23 123/75         Passed - Valid encounter within last 6 months    Recent Outpatient Visits           Yesterday Encounter for annual physical exam   [place] [facility] [place], Marzella Schlein, MD   2 months ago Essential (primary) hypertension   [place] [facility] [place], Marzella Schlein, MD   1 year ago Annual physical exam   [place] [facility] Alfredia Ferguson, PA-C   1 year ago Essential (primary) hypertension   [place] [facility] [place], Marzella Schlein, MD   2 years ago Annual physical exam   [facility] Bosie Clos, MD  Future Appointments             In 6 months Bacigalupo, Marzella Schlein, MD [facility]             rosuvastatin (CRESTOR) 5 MG tablet [Pharmacy Med Name: ROSUVASTATIN CALCIUM 5 MG TAB] 90 tablet 2    Sig: TAKE 1 TABLET BY MOUTH DAILY     Cardiovascular:  Antilipid - Statins 2 Failed - 06/06/2023  5:44 PM      Failed - Lipid Panel in normal range within the last 12 months    Cholesterol, Total  Date Value Ref Range Status  03/21/2023 172 100 - 199 mg/dL Final   LDL Chol Calc (NIH)  Date Value Ref Range Status  03/21/2023 98 0 - 99 mg/dL Final   HDL  Date Value Ref Range Status  03/21/2023 46 >39 mg/dL Final   Triglycerides  Date Value Ref Range Status  03/21/2023 160 (H) 0 - 149 mg/dL Final         Passed - Cr in normal range and within 360 days    Creatinine  Date Value Ref Range Status  11/27/2014 0.75 mg/dL Final    Comment:    1.61-0.96 NOTE: New Reference Range  10/29/14    Creatinine, Ser  Date Value Ref Range Status  03/21/2023 0.81 0.57 - 1.00 mg/dL Final         Passed - Patient is not  pregnant      Passed - Valid encounter within last 12 months    Recent Outpatient Visits           Yesterday Encounter for annual physical exam   [place] [facility] [place], Marzella Schlein, MD   2 months ago Essential (primary) hypertension   [place] [facility] [facility] [place], Marzella Schlein, MD   1 year ago Annual physical exam   [place] [facility] Alfredia Ferguson, PA-C   1 year ago Essential (primary) hypertension   [facility] [place], Marzella Schlein, MD   2 years ago Annual physical exam   [place] [facility] Bosie Clos, MD       Future Appointments             In 6 months Bacigalupo, Marzella Schlein, MD [place] [facility]             hydrochlorothiazide (HYDRODIURIL) 25 MG tablet [Pharmacy Med Name: HYDROCHLOROTHIAZIDE 25 MG TAB] 90 tablet 2    Sig: TAKE 1 TABLET BY MOUTH DAILY     Cardiovascular: Diuretics - Thiazide Passed - 06/06/2023  5:44 PM      Passed - Cr in normal range and within 180 days    Creatinine  Date Value Ref Range Status  11/27/2014 0.75 mg/dL Final    Comment:    0.45-4.09 NOTE: New Reference Range  10/29/14    Creatinine, Ser  Date Value Ref Range Status  03/21/2023 0.81 0.57 - 1.00 mg/dL Final         Passed - K in normal range and within 180 days    Potassium  Date Value Ref Range Status  03/21/2023 4.2 3.5 - 5.2 mmol/L Final  11/27/2014 3.0 (L) mmol/L Final    Comment:    3.5-5.1 NOTE: New Reference Range  10/29/14          Passed - Na in normal range and within 180 days    Sodium  Date  Value Ref Range Status  03/21/2023 143 134 - 144 mmol/L Final  11/27/2014 142 mmol/L Final    Comment:    135-145 NOTE: New Reference Range  10/29/14          Passed - Last BP in normal range    BP Readings from Last 1 Encounters:  06/06/23 123/75         Passed - Valid encounter within last 6 months    Recent  Outpatient Visits           Yesterday Encounter for annual physical exam   [facility] [place], Marzella Schlein, MD   2 months ago Essential (primary) hypertension   [place] [facility] [place], Marzella Schlein, MD   1 year ago Annual physical exam   [facility] Alfredia Ferguson, PA-C   1 year ago Essential (primary) hypertension   [place] [facility] [place], Marzella Schlein, MD   2 years ago Annual physical exam   [facility] Bosie Clos, MD       Future Appointments             In 6 months Bacigalupo, Marzella Schlein, MD [facility]

## 2023-06-08 ENCOUNTER — Telehealth: Payer: Self-pay

## 2023-06-08 ENCOUNTER — Other Ambulatory Visit: Payer: Self-pay

## 2023-06-08 ENCOUNTER — Ambulatory Visit
Admission: RE | Admit: 2023-06-08 | Discharge: 2023-06-08 | Disposition: A | Discharge status: Home / Self Care | Payer: Managed Care, Other (non HMO) | Source: Ambulatory Visit | Attending: Physician Assistant | Admitting: Physician Assistant

## 2023-06-08 DIAGNOSIS — Z860100 Personal history of colon polyps, unspecified: Secondary | ICD-10-CM

## 2023-06-08 DIAGNOSIS — Z1231 Encounter for screening mammogram for malignant neoplasm of breast: Secondary | ICD-10-CM | POA: Insufficient documentation

## 2023-06-08 MED ORDER — NA SULFATE-K SULFATE-MG SULF 17.5-3.13-1.6 GM/177ML PO SOLN
1.0000 | Freq: Once | ORAL | 0 refills | Status: AC
Start: 2023-06-08 — End: 2023-06-08

## 2023-06-08 NOTE — Telephone Encounter (Signed)
Pt requesting call back to schedule colonoscopy.

## 2023-06-08 NOTE — Telephone Encounter (Signed)
Gastroenterology Pre-Procedure Review  Request Date: 07/08/23 Requesting Physician: Dr. Allegra Lai  PATIENT REVIEW QUESTIONS: The patient responded to the following health history questions as indicated:    1. Are you having any GI issues? no 2. Do you have a personal history of Polyps? yes (last colonoscopy was performed by Dr. Allegra Lai 05/30/2018 ) 3. Do you have a family history of Colon Cancer or Polyps? yes (maternal grandmother colon cancer) 4. Diabetes Mellitus? no 5. Joint replacements in the past 12 months?no 6. Major health problems in the past 3 months?no 7. Any artificial heart valves, MVP, or defibrillator?no    MEDICATIONS & ALLERGIES:    Patient reports the following regarding taking any anticoagulation/antiplatelet therapy:   Plavix, Coumadin, Eliquis, Xarelto, Lovenox, Pradaxa, Brilinta, or Effient? no Aspirin? no  Patient confirms/reports the following medications:  Current Outpatient Medications  Medication Sig Dispense Refill   amLODipine (NORVASC) 10 MG tablet TAKE 1 TABLET BY MOUTH DAILY 90 tablet 2   cetirizine (ZYRTEC) 10 MG tablet Take 1 tablet (10 mg total) by mouth daily. 90 tablet 3   colchicine 0.6 MG tablet TAKE 2 TABLETS BY MOUTH AT FIRST SIGN OF GOUT FLARE. THEN TAKE 1 TABLET 1 HOURLATER. CONTINUE 1 TABLET TWICE A DAY UNTIL 48 HOURS AFTER RESOLUTION OF FLARE 30 tablet 1   cyclobenzaprine (FLEXERIL) 5 MG tablet Take 1 tablet (5 mg total) by mouth 3 (three) times daily as needed for muscle spasms. 30 tablet 0   fluticasone (FLONASE) 50 MCG/ACT nasal spray Place 2 sprays into both nostrils daily. 16 g 6   hydrochlorothiazide (HYDRODIURIL) 25 MG tablet TAKE 1 TABLET BY MOUTH DAILY 90 tablet 2   losartan (COZAAR) 100 MG tablet TAKE 1 TABLET BY MOUTH DAILY 90 tablet 2   rosuvastatin (CRESTOR) 5 MG tablet TAKE 1 TABLET BY MOUTH DAILY 90 tablet 2   No current facility-administered medications for this visit.    Patient confirms/reports the following allergies:   Allergies  Allergen Reactions   Penicillins Rash    Did it involve swelling of the face/tongue/throat, SOB, or low BP? No Did it involve sudden or severe rash/hives, skin peeling, or any reaction on the inside of your mouth or nose? No Did you need to seek medical attention at a hospital or doctor's office? No When did it last happen?     childhood allergy  If all above answers are "NO", may proceed with cephalosporin use.     No orders of the defined types were placed in this encounter.   AUTHORIZATION INFORMATION Primary Insurance: 1D#: Group #:  Secondary Insurance: 1D#: Group #:  SCHEDULE INFORMATION: Date: 07/08/23 Time: Location: [facility]

## 2023-06-29 ENCOUNTER — Encounter: Payer: Self-pay | Admitting: Family Medicine

## 2023-06-29 ENCOUNTER — Ambulatory Visit (INDEPENDENT_AMBULATORY_CARE_PROVIDER_SITE_OTHER): Payer: Managed Care, Other (non HMO) | Admitting: Family Medicine

## 2023-06-29 VITALS — BP 128/73 | HR 65 | Resp 16 | Ht 67.0 in | Wt 229.0 lb

## 2023-06-29 DIAGNOSIS — M10071 Idiopathic gout, right ankle and foot: Secondary | ICD-10-CM

## 2023-06-29 MED ORDER — NAPROXEN 500 MG PO TABS
500.0000 mg | ORAL_TABLET | Freq: Two times a day (BID) | ORAL | 0 refills | Status: DC
Start: 2023-06-29 — End: 2023-08-11

## 2023-06-29 NOTE — Assessment & Plan Note (Signed)
  Gout Flare Acute gout flare in the big toe, not responding to initial colchicine treatment. Patient has a history of gout with 3-4 episodes per year. No history of kidney disease. -Will plan to do colchicine as directed when future flares occur, two tablets initially, followed by one tablet an hour later, then one tablet twice daily until flare resolves, ensuring she continues through 24-48 hours after resolution. -For current episode, prescribed Naproxen (Aleve) 500mg  twice daily with meals for one week to reduce inflammation. -Advise patient to avoid Anacin, other NSAIDs, and aspirin while taking Naproxen. -Provided patient with information on foods to avoid to prevent gout flares. -Check response to treatment and adjust as necessary.  General Health Maintenance -Advise patient to monitor alcohol and red meat intake to prevent future gout flares.

## 2023-06-29 NOTE — Progress Notes (Signed)
Established patient visit   Patient: Christina Holloway   DOB: July 24, 1961   61 y.o. Female  MRN: [id number] Visit Date: 06/29/2023  Today's healthcare provider: Sherlyn Hay, DO   Chief Complaint  Patient presents with   Gout    Right foot gout flare-started Friday. Thought it was getting better yesteday but it is back to hurting cholcecine not working anymore.    Subjective    HPI Discussed the use of AI scribe software for clinical note transcription with the patient, who gave verbal consent to proceed.  History of Present Illness   The patient, with a history of recurrent gout, presents with a flare-up that started on Friday. She reports that the pain is located on the big toe and is associated with swelling. The patient has been taking colchicine, which usually helps, but this time it has not been effective. She took two tablets initially, followed by one tablet, but discontinued the medication when she did not notice any improvement. She also tried Anacin (aspirin), but it did not provide any relief. The patient has been elevating the foot and applying ice packs. The pain initially seemed to improve, but then worsened again, with the patient describing it as throbbing. The patient reports that she has not had a gout flare-up in a while before this episode. She also mentions that she has experienced gout in the other foot in the past, but never in the ankles. The patient reports having about three to four gout episodes per year. She also mentions that she had some alcohol and red meat this week, which are known triggers for gout.         Medications: Outpatient Medications Prior to Visit  Medication Sig   amLODipine (NORVASC) 10 MG tablet TAKE 1 TABLET BY MOUTH DAILY   cetirizine (ZYRTEC) 10 MG tablet Take 1 tablet (10 mg total) by mouth daily.   colchicine 0.6 MG tablet TAKE 2 TABLETS BY MOUTH AT FIRST SIGN OF GOUT FLARE. THEN TAKE 1 TABLET 1 HOURLATER. CONTINUE 1 TABLET TWICE A  DAY UNTIL 48 HOURS AFTER RESOLUTION OF FLARE   cyclobenzaprine (FLEXERIL) 5 MG tablet Take 1 tablet (5 mg total) by mouth 3 (three) times daily as needed for muscle spasms.   fluticasone (FLONASE) 50 MCG/ACT nasal spray Place 2 sprays into both nostrils daily.   hydrochlorothiazide (HYDRODIURIL) 25 MG tablet TAKE 1 TABLET BY MOUTH DAILY   losartan (COZAAR) 100 MG tablet TAKE 1 TABLET BY MOUTH DAILY   rosuvastatin (CRESTOR) 5 MG tablet TAKE 1 TABLET BY MOUTH DAILY   No facility-administered medications prior to visit.    Review of Systems  Constitutional:  Negative for chills and fever.  Musculoskeletal:  Positive for joint swelling.  Skin:  Positive for color change.        Objective    BP 128/73   Pulse 65   Resp 16   Ht 5\' 7"  (1.702 m)   Wt 229 lb (103.9 kg)   SpO2 100%   BMI 35.87 kg/m     Physical Exam Vitals and nursing note reviewed.  Constitutional:      General: She is not in acute distress.    Appearance: Normal appearance.  HENT:     Head: Normocephalic and atraumatic.  Eyes:     General: No scleral icterus.    Conjunctiva/sclera: Conjunctivae normal.  Cardiovascular:     Rate and Rhythm: Normal rate.  Pulmonary:     Effort: Pulmonary  effort is normal.  Musculoskeletal:       Feet:  Feet:     Comments: Area reddened/swollen as noted in image Neurological:     Mental Status: She is alert and oriented to person, place, and time. Mental status is at baseline.  Psychiatric:        Mood and Affect: Mood normal.        Behavior: Behavior normal.     No results found for any visits on 06/29/23.  Assessment & Plan    Idiopathic gout of right foot, unspecified chronicity Assessment & Plan:    Gout Flare Acute gout flare in the big toe, not responding to initial colchicine treatment. Patient has a history of gout with 3-4 episodes per year. No history of kidney disease. -Will plan to do colchicine as directed when future flares occur, two tablets  initially, followed by one tablet an hour later, then one tablet twice daily until flare resolves, ensuring she continues through 24-48 hours after resolution. -For current episode, prescribed Naproxen (Aleve) 500mg  twice daily with meals for one week to reduce inflammation. -Advise patient to avoid Anacin, other NSAIDs, and aspirin while taking Naproxen. -Provided patient with information on foods to avoid to prevent gout flares. -Check response to treatment and adjust as necessary.  General Health Maintenance -Advise patient to monitor alcohol and red meat intake to prevent future gout flares.        Orders: -     Naproxen; Take 1 tablet (500 mg total) by mouth 2 (two) times daily with a meal.  Dispense: 14 tablet; Refill: 0   Return if symptoms worsen or fail to improve.      I discussed the assessment and treatment plan with the patient  The patient was provided an opportunity to ask questions and all were answered. The patient agreed with the plan and demonstrated an understanding of the instructions.   The patient was advised to call back or seek an in-person evaluation if the symptoms worsen or if the condition fails to improve as anticipated.    Sherlyn Hay, DO  [facility] [phone] (phone) [phone] (fax)  [facility]

## 2023-07-07 ENCOUNTER — Encounter: Payer: Self-pay | Admitting: Gastroenterology

## 2023-07-08 ENCOUNTER — Ambulatory Visit: Payer: Managed Care, Other (non HMO) | Admitting: Registered Nurse

## 2023-07-08 ENCOUNTER — Encounter: Payer: Self-pay | Admitting: Gastroenterology

## 2023-07-08 ENCOUNTER — Ambulatory Visit
Admission: RE | Admit: 2023-07-08 | Discharge: 2023-07-08 | Disposition: A | Discharge status: Home / Self Care | Payer: Managed Care, Other (non HMO) | Attending: Gastroenterology | Admitting: Gastroenterology

## 2023-07-08 ENCOUNTER — Encounter
Admission: RE | Disposition: A | Discharge status: Home / Self Care | Payer: Self-pay | Source: Home / Self Care | Attending: Gastroenterology

## 2023-07-08 DIAGNOSIS — D128 Benign neoplasm of rectum: Secondary | ICD-10-CM | POA: Insufficient documentation

## 2023-07-08 DIAGNOSIS — Z1211 Encounter for screening for malignant neoplasm of colon: Secondary | ICD-10-CM | POA: Insufficient documentation

## 2023-07-08 DIAGNOSIS — Z79899 Other long term (current) drug therapy: Secondary | ICD-10-CM | POA: Insufficient documentation

## 2023-07-08 DIAGNOSIS — D123 Benign neoplasm of transverse colon: Secondary | ICD-10-CM | POA: Diagnosis not present

## 2023-07-08 DIAGNOSIS — K5730 Diverticulosis of large intestine without perforation or abscess without bleeding: Secondary | ICD-10-CM | POA: Diagnosis not present

## 2023-07-08 DIAGNOSIS — E7800 Pure hypercholesterolemia, unspecified: Secondary | ICD-10-CM | POA: Insufficient documentation

## 2023-07-08 DIAGNOSIS — M109 Gout, unspecified: Secondary | ICD-10-CM | POA: Insufficient documentation

## 2023-07-08 DIAGNOSIS — I10 Essential (primary) hypertension: Secondary | ICD-10-CM | POA: Diagnosis not present

## 2023-07-08 HISTORY — PX: COLONOSCOPY WITH PROPOFOL: SHX5780

## 2023-07-08 HISTORY — PX: POLYPECTOMY: SHX149

## 2023-07-08 SURGERY — COLONOSCOPY WITH PROPOFOL
Anesthesia: General

## 2023-07-08 MED ORDER — PROPOFOL 10 MG/ML IV BOLUS
INTRAVENOUS | Status: DC | PRN
  Administered 2023-07-08: 80 mg via INTRAVENOUS

## 2023-07-08 MED ORDER — PROPOFOL 500 MG/50ML IV EMUL
INTRAVENOUS | Status: DC | PRN
  Administered 2023-07-08: 100 ug/kg/min via INTRAVENOUS

## 2023-07-08 MED ORDER — SODIUM CHLORIDE 0.9 % IV SOLN: INTRAVENOUS | Status: DC | PRN

## 2023-07-08 MED ORDER — LIDOCAINE HCL (CARDIAC) PF 100 MG/5ML IV SOSY
PREFILLED_SYRINGE | INTRAVENOUS | Status: DC | PRN
  Administered 2023-07-08: 40 mg via INTRAVENOUS

## 2023-07-08 NOTE — H&P (Signed)
Arlyss Repress, MD [address]  [address]  [place], [place] [address]  Main: [phone]  Fax: [phone] Pager: [phone]  Primary Care Physician:  Erasmo Downer, MD Primary Gastroenterologist:  Dr. Arlyss Repress  Pre-Procedure History & Physical: HPI:  Christina Holloway is a 62 y.o. female is here for an colonoscopy.   Past Medical History:  Diagnosis Date   Abnormal LFTs 05/02/2015   Anemia, iron deficiency 08/24/2003   in past   Arthritis    lower back, legs   Calcium blood increased 05/02/2015   Cancer (HCC)    Decreased potassium in the blood 05/02/2015   Essential (primary) hypertension 08/24/2003   Gout 05/02/2015   Hypercholesterolemia without hypertriglyceridemia 04/29/2009   Malaise and fatigue 05/02/2015   Shortness of breath dyspnea    1 flight stairs   Wears dentures    full upper, partial lower    Past Surgical History:  Procedure Laterality Date   COLONOSCOPY WITH PROPOFOL N/A 05/08/2015   Procedure: COLONOSCOPY WITH PROPOFOL;  Surgeon: Midge Minium, MD;  Location: [facility];  Service: Endoscopy;  Laterality: N/A;   COLONOSCOPY WITH PROPOFOL N/A 05/30/2018   Procedure: COLONOSCOPY WITH PROPOFOL;  Surgeon: Toney Reil, MD;  Location: [facility];  Service: Gastroenterology;  Laterality: N/A;   CYSTOSCOPY WITH FULGERATION  09/14/2019   Procedure: CYSTOSCOPY WITH FULGERATION;  Surgeon: Sondra Come, MD;  Location: [facility] ORS;  Service: Urology;;   ENDOMETRIAL ABLATION     POLYPECTOMY  05/08/2015   Procedure: POLYPECTOMY;  Surgeon: Midge Minium, MD;  Location: [facility];  Service: Endoscopy;;   TRANSURETHRAL RESECTION OF BLADDER TUMOR WITH MITOMYCIN-C N/A 08/10/2019   Procedure: TRANSURETHRAL RESECTION OF BLADDER TUMOR WITH Gemcitabine;  Surgeon: Sondra Come, MD;  Location: [facility] OR[facility];  Service: Urology;  Laterality: N/A;   VAGINAL HYSTERECTOMY      Prior to Admission medications   Medication Sig Start Date End Date Taking?  Authorizing Provider  amLODipine (NORVASC) 10 MG tablet TAKE 1 TABLET BY MOUTH DAILY 06/07/23  Yes Bacigalupo, Marzella Schlein, MD  colchicine 0.6 MG tablet TAKE 2 TABLETS BY MOUTH AT FIRST SIGN OF GOUT FLARE. THEN TAKE 1 TABLET 1 HOURLATER. CONTINUE 1 TABLET TWICE A DAY UNTIL 48 HOURS AFTER RESOLUTION OF FLARE 04/08/23  Yes Bacigalupo, Marzella Schlein, MD  cyclobenzaprine (FLEXERIL) 5 MG tablet Take 1 tablet (5 mg total) by mouth 3 (three) times daily as needed for muscle spasms. 04/14/23  Yes Bacigalupo, Marzella Schlein, MD  hydrochlorothiazide (HYDRODIURIL) 25 MG tablet TAKE 1 TABLET BY MOUTH DAILY 06/07/23  Yes Bacigalupo, Marzella Schlein, MD  losartan (COZAAR) 100 MG tablet TAKE 1 TABLET BY MOUTH DAILY 06/07/23  Yes Bacigalupo, Marzella Schlein, MD  naproxen (NAPROSYN) 500 MG tablet Take 1 tablet (500 mg total) by mouth 2 (two) times daily with a meal. 06/29/23  Yes Pardue, Sarah N, DO  rosuvastatin (CRESTOR) 5 MG tablet TAKE 1 TABLET BY MOUTH DAILY 06/07/23  Yes Bacigalupo, Marzella Schlein, MD  cetirizine (ZYRTEC) 10 MG tablet Take 1 tablet (10 mg total) by mouth daily. 11/09/21   Erasmo Downer, MD  fluticasone (FLONASE) 50 MCG/ACT nasal spray Place 2 sprays into both nostrils daily. 11/09/21   Erasmo Downer, MD    Allergies as of 06/08/2023 - Review Complete 06/08/2023  Allergen Reaction Noted   Penicillins Rash 05/02/2015    Family History  Problem Relation Age of Onset   Hypertension Mother    Thyroid disease Mother  Prostate cancer Father    AAA (abdominal aortic aneurysm) Sister    Breast cancer Paternal Grandmother 26   Breast cancer Cousin 19    Social History   Socioeconomic History   Marital status: Single    Spouse name: Not on file   Number of children: Not on file   Years of education: Not on file   Highest education level: Not on file  Occupational History   Not on file  Tobacco Use   Smoking status: Never    Passive exposure: Never   Smokeless tobacco: Never  Vaping Use   Vaping status:  Never Used  Substance and Sexual Activity   Alcohol use: Yes    Alcohol/week: 36.0 standard drinks of alcohol    Types: 36 Cans of beer per week   Drug use: No   Sexual activity: Yes  Other Topics Concern   Not on file  Social History Narrative   Not on file   Social Determinants of Health   Financial Resource Strain: Not on file  Food Insecurity: Not on file  Transportation Needs: Not on file  Physical Activity: Not on file  Stress: Stress Concern Present (05/30/2018)   [employer] of Occupational Health - Occupational Stress Questionnaire    Feeling of Stress : To some extent  Social Connections: Unknown (05/30/2018)   Social Connection and Isolation Panel [NHANES]    Frequency of Communication with Friends and Family: Patient declined    Frequency of Social Gatherings with Friends and Family: Patient declined    Attends Religious Services: Patient declined    Database administrator or Organizations: Patient declined    Attends Banker Meetings: Patient declined    Marital Status: Patient declined  Intimate Partner Violence: Unknown (05/30/2018)   Humiliation, Afraid, Rape, and Kick questionnaire    Fear of Current or Ex-Partner: Patient declined    Emotionally Abused: Patient declined    Physically Abused: Patient declined    Sexually Abused: Patient declined    Review of Systems: See HPI, otherwise negative ROS  Physical Exam: BP 132/76   Pulse 65   Temp (!) 96.5 F (35.8 C) (Temporal)   Resp 16   Wt 103.5 kg   SpO2 100%   BMI 35.74 kg/m  General:   Alert,  pleasant and cooperative in NAD Head:  Normocephalic and atraumatic. Neck:  Supple; no masses or thyromegaly. Lungs:  Clear throughout to auscultation.    Heart:  Regular rate and rhythm. Abdomen:  Soft, nontender and nondistended. Normal bowel sounds, without guarding, and without rebound.   Neurologic:  Alert and  oriented x4;  grossly normal neurologically.  Impression/Plan: Christina Holloway is here for an colonoscopy to be performed for h/o colon adenoma  Risks, benefits, limitations, and alternatives regarding  colonoscopy have been reviewed with the patient.  Questions have been answered.  All parties agreeable.   Lannette Donath, MD  07/08/2023, 10:21 AM

## 2023-07-08 NOTE — Op Note (Signed)
[facility] Gastroenterology Patient Name: Christina Holloway Procedure Date: 07/08/2023 10:49 AM MRN: [id number] Account #: [id number] Date of Birth: January 25, 1961 Admit Type: Outpatient Age: 62 Room: [facility] ENDO ROOM 2 Gender: Female Note Status: Finalized Instrument Name: Prentice Docker [id number] Procedure:             Colonoscopy Indications:           Surveillance: Personal history of adenomatous polyps                         on last colonoscopy 5 years ago, Last colonoscopy:                         October 2019 Providers:             Toney Reil MD, MD Referring MD:          Toney Reil MD, MD (Referring MD), Marzella Schlein.                         Bacigalupo (Referring MD) Medicines:             General Anesthesia Complications:         No immediate complications. Estimated blood loss: None. Procedure:             Pre-Anesthesia Assessment:                        - Prior to the procedure, a History and Physical was                         performed, and patient medications and allergies were                         reviewed. The patient is competent. The risks and                         benefits of the procedure and the sedation options and                         risks were discussed with the patient. All questions                         were answered and informed consent was obtained.                         Patient identification and proposed procedure were                         verified by the physician, the nurse, the                         anesthesiologist, the anesthetist and the technician                         in the pre-procedure area in the procedure room in the                         endoscopy suite. Mental Status Examination: alert and  oriented. Airway Examination: normal oropharyngeal                         airway and neck mobility. Respiratory Examination:                         clear to auscultation. CV Examination:  normal.                         Prophylactic Antibiotics: The patient does not require                         prophylactic antibiotics. Prior Anticoagulants: The                         patient has taken no anticoagulant or antiplatelet                         agents. ASA Grade Assessment: III - A patient with                         severe systemic disease. After reviewing the risks and                         benefits, the patient was deemed in satisfactory                         condition to undergo the procedure. The anesthesia                         plan was to use general anesthesia. Immediately prior                         to administration of medications, the patient was                         re-assessed for adequacy to receive sedatives. The                         heart rate, respiratory rate, oxygen saturations,                         blood pressure, adequacy of pulmonary ventilation, and                         response to care were monitored throughout the                         procedure. The physical status of the patient was                         re-assessed after the procedure.                        After obtaining informed consent, the colonoscope was                         passed under direct vision. Throughout the procedure,  the patient's blood pressure, pulse, and oxygen                         saturations were monitored continuously. The                         Colonoscope was introduced through the anus and                         advanced to the the cecum, identified by appendiceal                         orifice and ileocecal valve. The colonoscopy was                         performed without difficulty. The patient tolerated                         the procedure well. The quality of the bowel                         preparation was evaluated using the BBPS [facility] Bowel                         Preparation Scale) with scores of:  Right Colon = 3,                         Transverse Colon = 3 and Left Colon = 3 (entire mucosa                         seen well with no residual staining, small fragments                         of stool or opaque liquid). The total BBPS score                         equals 9. The ileocecal valve, appendiceal orifice,                         and rectum were photographed. Findings:      The perianal and digital rectal examinations were normal. Pertinent       negatives include normal sphincter tone and no palpable rectal lesions.      Two sessile polyps were found in the rectum and transverse colon. The       polyps were 5 to 6 mm in size. These polyps were removed with a cold       snare. Resection and retrieval were complete. Estimated blood loss: none.      The retroflexed view of the distal rectum and anal verge was normal and       showed no anal or rectal abnormalities.      Multiple large-mouthed diverticula were found in the entire colon. Impression:            - Two 5 to 6 mm polyps in the rectum and in the                         transverse colon, removed with a  cold snare. Resected                         and retrieved.                        - The distal rectum and anal verge are normal on                         retroflexion view.                        - Diverticulosis in the entire examined colon. Recommendation:        - Discharge patient to home (with escort).                        - Resume previous diet today.                        - Continue present medications.                        - Await pathology results.                        - Repeat colonoscopy in 5 years for surveillance. Procedure Code(s):     --- Professional ---                        [phone], Colonoscopy, flexible; with removal of                         tumor(s), polyp(s), or other lesion(s) by snare                         technique Diagnosis Code(s):     --- Professional ---                         Z86.010, Personal history of colonic polyps                        D12.8, Benign neoplasm of rectum                        D12.3, Benign neoplasm of transverse colon (hepatic                         flexure or splenic flexure)                        K57.30, Diverticulosis of large intestine without                         perforation or abscess without bleeding CPT copyright 2022 American Medical Association. All rights reserved. The codes documented in this report are preliminary and upon coder review may  be revised to meet current compliance requirements. Dr. Libby Maw Toney Reil MD, MD 07/08/2023 11:08:02 AM This report has been signed electronically. Number of Addenda: 0 Note Initiated On: 07/08/2023 10:49 AM Scope Withdrawal Time: 0 hours 7 minutes 24 seconds  Total Procedure Duration: 0 hours 9 minutes 57 seconds  Estimated Blood Loss:  Estimated blood loss: none.      [facility]

## 2023-07-08 NOTE — Anesthesia Postprocedure Evaluation (Signed)
Anesthesia Post Note  Patient: Christina Holloway  Procedure(s) Performed: COLONOSCOPY WITH PROPOFOL POLYPECTOMY INTESTINAL  Patient location during evaluation: PACU Anesthesia Type: General Level of consciousness: awake Pain management: satisfactory to patient Vital Signs Assessment: post-procedure vital signs reviewed and stable Respiratory status: spontaneous breathing Cardiovascular status: blood pressure returned to baseline Anesthetic complications: no   No notable events documented.   Last Vitals:  Vitals:   07/08/23 1111 07/08/23 1131  BP: (!) 110/55 117/66  Pulse: 72   Resp:    Temp: (!) 35.8 C   SpO2: 100%     Last Pain:  Vitals:   07/08/23 1131  TempSrc:   PainSc: 0-No pain                 VAN STAVEREN,Keneshia Tena

## 2023-07-08 NOTE — Anesthesia Preprocedure Evaluation (Signed)
Anesthesia Evaluation  Patient identified by MRN, date of birth, ID band Patient awake    Reviewed: Allergy & Precautions, NPO status , Patient's Chart, lab work & pertinent test results  Airway Mallampati: III  TM Distance: >3 FB Neck ROM: Full    Dental  (+) Upper Dentures, Missing   Pulmonary neg pulmonary ROS, shortness of breath and with exertion   Pulmonary exam normal breath sounds clear to auscultation       Cardiovascular Exercise Tolerance: Good hypertension, Pt. on medications negative cardio ROS Normal cardiovascular exam Rhythm:Regular Rate:Normal     Neuro/Psych negative neurological ROS  negative psych ROS   GI/Hepatic negative GI ROS, Neg liver ROS,,,  Endo/Other  negative endocrine ROS    Renal/GU negative Renal ROS  negative genitourinary   Musculoskeletal   Abdominal  (+) + obese  Peds negative pediatric ROS (+)  Hematology negative hematology ROS (+) Blood dyscrasia, anemia   Anesthesia Other Findings Past Medical History: 05/02/2015: Abnormal LFTs 08/24/2003: Anemia, iron deficiency     Comment:  in past No date: Arthritis     Comment:  lower back, legs 05/02/2015: Calcium blood increased No date: Cancer (HCC) 05/02/2015: Decreased potassium in the blood 08/24/2003: Essential (primary) hypertension 05/02/2015: Gout 04/29/2009: Hypercholesterolemia without hypertriglyceridemia 05/02/2015: Malaise and fatigue No date: Shortness of breath dyspnea     Comment:  1 flight stairs No date: Wears dentures     Comment:  full upper, partial lower  Past Surgical History: 05/08/2015: COLONOSCOPY WITH PROPOFOL; N/A     Comment:  Procedure: COLONOSCOPY WITH PROPOFOL;  Surgeon: Midge Minium, MD;  Location: [facility];  Service:               Endoscopy;  Laterality: N/A; 05/30/2018: COLONOSCOPY WITH PROPOFOL; N/A     Comment:  Procedure: COLONOSCOPY WITH PROPOFOL;  Surgeon: Toney Reil, MD;  Location: [facility];  Service:               Gastroenterology;  Laterality: N/A; 09/14/2019: CYSTOSCOPY WITH FULGERATION     Comment:  Procedure: CYSTOSCOPY WITH FULGERATION;  Surgeon:               Sondra Come, MD;  Location: [facility] ORS;  Service:               Urology;; No date: ENDOMETRIAL ABLATION 05/08/2015: POLYPECTOMY     Comment:  Procedure: POLYPECTOMY;  Surgeon: Midge Minium, MD;                Location: [facility];  Service: Endoscopy;; 08/10/2019: TRANSURETHRAL RESECTION OF BLADDER TUMOR WITH MITOMYCIN- C; N/A     Comment:  Procedure: TRANSURETHRAL RESECTION OF BLADDER TUMOR WITH              Gemcitabine;  Surgeon: Sondra Come, MD;  Location:               [facility];  Service: Urology;  Laterality: N/A; No date: VAGINAL HYSTERECTOMY  BMI    Body Mass Index: 35.74 kg/m      Reproductive/Obstetrics negative OB ROS                             Anesthesia Physical Anesthesia Plan  ASA: 3  Anesthesia Plan: General  Post-op Pain Management:    Induction: Intravenous  PONV Risk Score and Plan: Propofol infusion and TIVA  Airway Management Planned: Natural Airway and Nasal Cannula  Additional Equipment:   Intra-op Plan:   Post-operative Plan:   Informed Consent: I have reviewed the patients History and Physical, chart, labs and discussed the procedure including the risks, benefits and alternatives for the proposed anesthesia with the patient or authorized representative who has indicated his/her understanding and acceptance.     Dental Advisory Given  Plan Discussed with: CRNA and Surgeon  Anesthesia Plan Comments:        Anesthesia Quick Evaluation

## 2023-07-08 NOTE — Transfer of Care (Signed)
Immediate Anesthesia Transfer of Care Note  Patient: Christina Holloway  Procedure(s) Performed: COLONOSCOPY WITH PROPOFOL POLYPECTOMY INTESTINAL  Patient Location: PACU  Anesthesia Type:General  Level of Consciousness: awake, alert , and oriented  Airway & Oxygen Therapy: Patient Spontanous Breathing  Post-op Assessment: Report given to RN and Post -op Vital signs reviewed and stable  Post vital signs: stable  Last Vitals:  Vitals Value Taken Time  BP 110/55 07/08/23 1111  Temp    Pulse 69 07/08/23 1111  Resp 18 07/08/23 1111  SpO2 100 % 07/08/23 1111  Vitals shown include unfiled device data.  Last Pain:  Vitals:   07/08/23 1011  TempSrc: Temporal         Complications: No notable events documented.

## 2023-07-11 ENCOUNTER — Encounter: Payer: Self-pay | Admitting: Gastroenterology

## 2023-07-11 LAB — SURGICAL PATHOLOGY

## 2023-07-12 ENCOUNTER — Encounter: Payer: Self-pay | Admitting: Gastroenterology

## 2023-07-13 ENCOUNTER — Ambulatory Visit: Payer: Self-pay

## 2023-07-13 NOTE — Telephone Encounter (Signed)
  Chief Complaint: constipation Symptoms: no sx, no BM in 5 days  Frequency: 5 days, post colonoscopy 07/08/23 Pertinent Negatives: NA Disposition: [] ED /[] Urgent Care (no appt availability in office) / [] Appointment(In office/virtual)/ []  [facility]/ [x] Home Care/ [] Refused Recommended Disposition /[] [facility]/ []  Follow-up with PCP Additional Notes: pt denies having any sx, just concerned because she realized been 5 days since no BM. Pt's BM weren't normal prior to procedure. Pt has tried to contact GI but no answer yet. Recommended care advice to pt and if any new sx occur she can call back. Pt will wait to hear back from GI. No further assistance needed from NT.   Summary: no bowel movement after 5 days   Pt had a procedure on 07-08-2023. Patient has not had a bowel movement in 5 days and is concerned. Patient unable to reach her GI provider.         Reason for Disposition  MILD constipation  Answer Assessment - Initial Assessment Questions 1. STOOL PATTERN OR FREQUENCY: "How often do you have a bowel movement (BM)?"  (Normal range: 3 times a day to every 3 days)  "When was your last BM?"       No BM  2. STRAINING: "Do you have to strain to have a BM?"      no 3. RECTAL PAIN: "Does your rectum hurt when the stool comes out?" If Yes, ask: "Do you have hemorrhoids? How bad is the pain?"  (Scale 1-10; or mild, moderate, severe)     no 4. STOOL COMPOSITION: "Are the stools hard?"      no 8. MEDICINES: "Have you been taking any new medicines?" "Are you taking any narcotic pain medicines?" (e.g., Dilaudid, morphine, Percocet, Vicodin)     no 9. LAXATIVES: "Have you been using any stool softeners, laxatives, or enemas?"  If Yes, ask "What, how often, and when was the last time?"     no 10. ACTIVITY:  "How much walking do you do every day?"  "Has your activity level decreased in the past week?"        no 11. CAUSE: "What do you think is causing the constipation?"         Post colonoscopy 07/08/23 12. OTHER SYMPTOMS: "Do you have any other symptoms?" (e.g., abdomen pain, bloating, fever, vomiting)       no 13. MEDICAL HISTORY: "Do you have a history of hemorrhoids, rectal fissures, or rectal surgery or rectal abscess?"         Polyps  Protocols used: Constipation-A-AH

## 2023-07-26 ENCOUNTER — Ambulatory Visit: Payer: Managed Care, Other (non HMO) | Admitting: Urology

## 2023-07-26 ENCOUNTER — Encounter: Payer: Self-pay | Admitting: Urology

## 2023-07-26 VITALS — BP 131/81 | HR 66 | Ht 67.0 in | Wt 234.6 lb

## 2023-07-26 DIAGNOSIS — Z8551 Personal history of malignant neoplasm of bladder: Secondary | ICD-10-CM

## 2023-07-26 NOTE — Progress Notes (Signed)
Bladder cancer surveillance note   UROLOGIC HISTORY DMAYA KHATIB is a 62 y.o. 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  She underwent TURBT on 08/10/2019 with pathology showing high-grade T1 with no muscle invasion, and repeat TURBT on 09/14/2019 showed no evidence of malignancy.  She completed induction BCG in April 2021, and maintenance BCG in December 2023   She is a never smoker.   Initial Diagnosis of Bladder  Year: 07/2019 Pathology: High-grade T1, muscle present and not involved   Recurrent Bladder Cancer Diagnosis None   Treatments for Bladder Cancer 08/10/2019: Initial TURBT 3 cm, HG T1 09/14/2019: 2nd look TURBT, pathology negative 3-11/2019: Induction BCG x6 03/2020: mBCG #1 06/2020: mBCG #2 12/2020: mBCG #3 06/2021: mBCG #4 01/2022: mBCG #5 06/2022:mBCG #6     AUA Risk Category High   Cystoscopy Procedure Note: After informed consent and discussion of the procedure and its risks, Loie A Propps was positioned and prepped in the standard fashion. Cystoscopy was performed with the a flexible cystoscope. The urethra, bladder neck and entire bladder was visualized in a standard fashion, and mucosa was normal throughout.  Scar right lateral wall.  The ureteral orifices were visualized in their normal location and orientation.  No abnormalities on retroflexion.    CT urogram March 2023 with no urologic abnormalities.  Repeat CT ordered for routine surveillance every 1 to 2 years, will call with results  RTC for cystoscopy 1 year    Legrand Rams, MD 07/26/2023

## 2023-08-08 ENCOUNTER — Other Ambulatory Visit: Payer: Self-pay | Admitting: Family Medicine

## 2023-08-08 DIAGNOSIS — M10071 Idiopathic gout, right ankle and foot: Secondary | ICD-10-CM

## 2023-08-08 NOTE — Telephone Encounter (Signed)
Requested medication (s) are due for refill today: review for refill  Requested medication (s) are on the active medication list: yes  Last refill:  06/29/23 #14/0  Future visit scheduled: yes  Notes to clinic:  ok to refill? 7 DS was sent in previous.      Requested Prescriptions  Pending Prescriptions Disp Refills   naproxen (NAPROSYN) 500 MG tablet [Pharmacy Med Name: NAPROXEN 500 MG TAB] 14 tablet 0    Sig: TAKE 1 TABLET BY MOUTH TWICE A DAY WITH MEALS     Analgesics:  NSAIDS Failed - 08/08/2023 10:13 AM      Failed - Manual Review: Labs are only required if the patient has taken medication for more than 8 weeks.      Passed - Cr in normal range and within 360 days    Creatinine  Date Value Ref Range Status  11/27/2014 0.75 mg/dL Final    Comment:    7.82-9.56 NOTE: New Reference Range  10/29/14    Creatinine, Ser  Date Value Ref Range Status  03/21/2023 0.81 0.57 - 1.00 mg/dL Final         Passed - HGB in normal range and within 360 days    Hemoglobin  Date Value Ref Range Status  03/21/2023 12.4 11.1 - 15.9 g/dL Final         Passed - PLT in normal range and within 360 days    Platelets  Date Value Ref Range Status  03/21/2023 331 150 - 450 x10E3/uL Final         Passed - HCT in normal range and within 360 days    Hematocrit  Date Value Ref Range Status  03/21/2023 37.1 34.0 - 46.6 % Final         Passed - eGFR is 30 or above and within 360 days    EGFR (African American)  Date Value Ref Range Status  11/27/2014 >60  Final   GFR calc Af Amer  Date Value Ref Range Status  02/15/2020 78 >59 mL/min/1.73 Final    Comment:    **[facility] currently reports eGFR in compliance with the current**   recommendations of the [employer]. [facility] will   update reporting as new guidelines are published from the NKF-ASN   Task force.    EGFR (Non-African Amer.)  Date Value Ref Range Status  11/27/2014 >60  Final    Comment:    eGFR values  <10mL/min/1.73 m2 may be an indication of chronic kidney disease (CKD). Calculated eGFR is useful in patients with stable renal function. The eGFR calculation will not be reliable in acutely ill patients when serum creatinine is changing rapidly. It is not useful in patients on dialysis. The eGFR calculation may not be applicable to patients at the low and high extremes of body sizes, pregnant women, and vegetarians.    GFR calc non Af Amer  Date Value Ref Range Status  02/15/2020 68 >59 mL/min/1.73 Final   eGFR  Date Value Ref Range Status  03/21/2023 83 >59 mL/min/1.73 Final         Passed - Patient is not pregnant      Passed - Valid encounter within last 12 months    Recent Outpatient Visits           1 month ago Idiopathic gout of right foot, unspecified chronicity   [facility] [place], Monico Blitz, DO   2 months ago Encounter for annual physical exam   [facility] [place], Marzella Schlein, MD   4 months ago Essential (primary) hypertension   [facility] [place], Marzella Schlein, MD   1 year ago Annual physical exam   [facility] Alfredia Ferguson, PA-C   1 year ago Essential (primary) hypertension   [facility] [place], Marzella Schlein, MD       Future Appointments             In 3 months Bacigalupo, Marzella Schlein, MD [facility]

## 2023-08-16 ENCOUNTER — Ambulatory Visit
Admission: RE | Admit: 2023-08-16 | Discharge: 2023-08-16 | Disposition: A | Discharge status: Home / Self Care | Payer: Managed Care, Other (non HMO) | Source: Ambulatory Visit | Attending: Urology | Admitting: Urology

## 2023-08-16 DIAGNOSIS — Z8551 Personal history of malignant neoplasm of bladder: Secondary | ICD-10-CM | POA: Diagnosis present

## 2023-08-16 MED ORDER — IOHEXOL 300 MG/ML  SOLN
100.0000 mL | Freq: Once | INTRAMUSCULAR | Status: AC | PRN
  Administered 2023-08-16: 100 mL via INTRAVENOUS

## 2023-10-06 DIAGNOSIS — Z8739 Personal history of other diseases of the musculoskeletal system and connective tissue: Secondary | ICD-10-CM | POA: Insufficient documentation

## 2023-10-06 DIAGNOSIS — R7303 Prediabetes: Secondary | ICD-10-CM | POA: Insufficient documentation

## 2023-12-05 ENCOUNTER — Ambulatory Visit: Payer: Self-pay | Admitting: Family Medicine

## 2023-12-12 ENCOUNTER — Encounter: Payer: Self-pay | Admitting: Family Medicine

## 2023-12-12 ENCOUNTER — Telehealth (INDEPENDENT_AMBULATORY_CARE_PROVIDER_SITE_OTHER): Admitting: Family Medicine

## 2023-12-12 VITALS — BP 125/82

## 2023-12-12 DIAGNOSIS — R7303 Prediabetes: Secondary | ICD-10-CM

## 2023-12-12 DIAGNOSIS — E7800 Pure hypercholesterolemia, unspecified: Secondary | ICD-10-CM | POA: Diagnosis not present

## 2023-12-12 DIAGNOSIS — E559 Vitamin D deficiency, unspecified: Secondary | ICD-10-CM

## 2023-12-12 DIAGNOSIS — I10 Essential (primary) hypertension: Secondary | ICD-10-CM | POA: Diagnosis not present

## 2023-12-12 MED ORDER — AMLODIPINE BESYLATE 10 MG PO TABS: 10.0000 mg | ORAL_TABLET | Freq: Every day | ORAL | 2 refills | Status: DC

## 2023-12-12 MED ORDER — HYDROCHLOROTHIAZIDE 25 MG PO TABS: 25.0000 mg | ORAL_TABLET | Freq: Every day | ORAL | 2 refills | Status: DC

## 2023-12-12 MED ORDER — LOSARTAN POTASSIUM 100 MG PO TABS: 100.0000 mg | ORAL_TABLET | Freq: Every day | ORAL | 2 refills | Status: DC

## 2023-12-12 MED ORDER — ROSUVASTATIN CALCIUM 5 MG PO TABS: 5.0000 mg | ORAL_TABLET | Freq: Every day | ORAL | 2 refills | Status: AC

## 2023-12-12 NOTE — Assessment & Plan Note (Signed)
 Currently managed with rosuvastatin  5 mg daily. Discussed potential impact of carnivore diet on cholesterol levels and advised on dietary modifications to avoid increasing cholesterol. Emphasized the importance of including leaner proteins to prevent cholesterol elevation. - Order lipid panel to assess current cholesterol levels. - Continue rosuvastatin  5 mg daily. - Advise on dietary modifications to include leaner proteins to prevent increase in cholesterol levels.

## 2023-12-12 NOTE — Assessment & Plan Note (Signed)
BMI 37 and assoc with HTN, HLD ?Discussed importance of healthy weight management ?Discussed diet and exercise  ?

## 2023-12-12 NOTE — Progress Notes (Signed)
 MyChart Video Visit    Virtual Visit via Video Note   This format is felt to be most appropriate for this patient at this time. Physical exam was limited by quality of the video and audio technology used for the visit.    Patient location: home Provider location: [facility] Persons involved in the visit: patient, provider  I discussed the limitations of evaluation and management by telemedicine and the availability of in person appointments. The patient expressed understanding and agreed to proceed.  Patient: Christina Holloway   DOB: 1961/02/18   63 y.o. Female  MRN: [id number] Visit Date: 12/12/2023  Today's healthcare provider: Aden Agreste, MD   No chief complaint on file.  Subjective    HPI   Discussed the use of AI scribe software for clinical note transcription with the patient, who gave verbal consent to proceed.  History of Present Illness   The patient, with a history of hypertension, hyperlipidemia, and prediabetes, presents for a regular follow-up. She has been taking amlodipine  10mg , hydrochlorothiazide  25mg , and losartan  100mg  for hypertension, and Crestor  5mg  for hyperlipidemia. She also takes Zyrtec  and Flonase  for allergies. Recently, she started on Wellbutrin 300mg  for weight loss, which she believes is helping. She also reports taking Flexeril  for muscle spasms. The patient's blood pressure was recently checked at a clinic and was reportedly in the 120s/80s range. She also takes a vitamin D  supplement due to previously low levels. The patient is considering a carnivore diet for weight loss.         Review of Systems      Objective    BP 125/82 Comment: home reading      Physical Exam Constitutional:      General: She is not in acute distress. HENT:     Head: Normocephalic.  Pulmonary:     Effort: Pulmonary effort is normal. No respiratory distress.  Neurological:     Mental Status: She is alert and oriented to person,  place, and time. Mental status is at baseline.        Assessment & Plan     Problem List Items Addressed This Visit       Cardiovascular and Mediastinum   Essential (primary) hypertension - Primary   Blood pressure is well-controlled with current medication regimen. Recent readings are within target range, approximately 120s/80s. - Continue current antihypertensive medications: amlodipine  10 mg daily, hydrochlorothiazide  25 mg daily, losartan  100 mg daily.      Relevant Medications   amLODipine  (NORVASC ) 10 MG tablet   hydrochlorothiazide  (HYDRODIURIL ) 25 MG tablet   losartan  (COZAAR ) 100 MG tablet   rosuvastatin  (CRESTOR ) 5 MG tablet   Other Relevant Orders   Comprehensive metabolic panel with GFR     Other   Hypercholesterolemia without hypertriglyceridemia   Currently managed with rosuvastatin  5 mg daily. Discussed potential impact of carnivore diet on cholesterol levels and advised on dietary modifications to avoid increasing cholesterol. Emphasized the importance of including leaner proteins to prevent cholesterol elevation. - Order lipid panel to assess current cholesterol levels. - Continue rosuvastatin  5 mg daily. - Advise on dietary modifications to include leaner proteins to prevent increase in cholesterol levels.      Relevant Medications   amLODipine  (NORVASC ) 10 MG tablet   hydrochlorothiazide  (HYDRODIURIL ) 25 MG tablet   losartan  (COZAAR ) 100 MG tablet   rosuvastatin  (CRESTOR ) 5 MG tablet   Other Relevant Orders   Comprehensive metabolic panel with GFR   Lipid panel   Morbid  obesity (HCC)   BMI 37 and assoc with HTN, HLD Discussed importance of healthy weight management Discussed diet and exercise       Hyperglycemia   Prediabetes Monitoring with A1c to assess blood sugar control. Discussed the importance of regular monitoring and the ability to obtain labs at any [facility] location. - Order A1c test to monitor blood sugar control.      Other Visit  Diagnoses       Avitaminosis D       Relevant Orders   VITAMIN D  25 Hydroxy (Vit-D Deficiency, Fractures)           Vitamin D  deficiency Previously identified vitamin D  deficiency. She is on supplementation. Vitamin D  levels have not been checked recently. - Order vitamin D  level test to assess current status.  Allergic rhinitis Managed with Zyrtec  and Flonase , especially during allergy season. - Continue Zyrtec  and Flonase  as needed for allergy symptoms.       Meds ordered this encounter  Medications   amLODipine  (NORVASC ) 10 MG tablet    Sig: Take 1 tablet (10 mg total) by mouth daily.    Dispense:  90 tablet    Refill:  2   hydrochlorothiazide  (HYDRODIURIL ) 25 MG tablet    Sig: Take 1 tablet (25 mg total) by mouth daily.    Dispense:  90 tablet    Refill:  2   losartan  (COZAAR ) 100 MG tablet    Sig: Take 1 tablet (100 mg total) by mouth daily.    Dispense:  90 tablet    Refill:  2   rosuvastatin  (CRESTOR ) 5 MG tablet    Sig: Take 1 tablet (5 mg total) by mouth daily.    Dispense:  90 tablet    Refill:  2     Return in about 6 months (around 06/12/2024) for CPE.     I discussed the assessment and treatment plan with the patient. The patient was provided an opportunity to ask questions and all were answered. The patient agreed with the plan and demonstrated an understanding of the instructions.   The patient was advised to call back or seek an in-person evaluation if the symptoms worsen or if the condition fails to improve as anticipated.   Aden Agreste, MD [facility] [phone] (phone) [phone] (fax)  [facility]

## 2023-12-12 NOTE — Assessment & Plan Note (Signed)
 Prediabetes Monitoring with A1c to assess blood sugar control. Discussed the importance of regular monitoring and the ability to obtain labs at any [facility] location. - Order A1c test to monitor blood sugar control.

## 2023-12-12 NOTE — Assessment & Plan Note (Signed)
 Blood pressure is well-controlled with current medication regimen. Recent readings are within target range, approximately 120s/80s. - Continue current antihypertensive medications: amlodipine  10 mg daily, hydrochlorothiazide  25 mg daily, losartan  100 mg daily.

## 2024-03-20 ENCOUNTER — Encounter: Payer: Self-pay | Admitting: Urology

## 2024-06-20 ENCOUNTER — Emergency Department
Admission: EM | Admit: 2024-06-20 | Discharge: 2024-06-20 | Disposition: A | Discharge status: Home / Self Care | Attending: Emergency Medicine | Admitting: Emergency Medicine

## 2024-06-20 ENCOUNTER — Other Ambulatory Visit: Payer: Self-pay

## 2024-06-20 DIAGNOSIS — M5431 Sciatica, right side: Secondary | ICD-10-CM | POA: Insufficient documentation

## 2024-06-20 HISTORY — DX: Sciatica, unspecified side: M54.30

## 2024-06-20 MED ORDER — OXYCODONE-ACETAMINOPHEN 5-325 MG PO TABS
1.0000 | ORAL_TABLET | Freq: Once | ORAL | Status: AC
  Administered 2024-06-20: 1 via ORAL
  Filled 2024-06-20: qty 1

## 2024-06-20 MED ORDER — DEXAMETHASONE SOD PHOSPHATE PF 10 MG/ML IJ SOLN
10.0000 mg | Freq: Once | INTRAMUSCULAR | Status: AC
  Administered 2024-06-20: 10 mg via INTRAMUSCULAR

## 2024-06-20 MED ORDER — HYDROCODONE-ACETAMINOPHEN 5-325 MG PO TABS: 1.0000 | ORAL_TABLET | Freq: Four times a day (QID) | ORAL | 0 refills | Status: AC | PRN

## 2024-06-20 NOTE — ED Triage Notes (Signed)
 Pt to ED for R sciatic pain since September worse since last night. Got a shot yesterday at clinic. Is taking muscle relaxants.pain is sharp, shooting from buttock to foot.

## 2024-06-20 NOTE — ED Provider Notes (Signed)
 [facility] Provider Note    Event Date/Time   First MD Initiated Contact with Patient 06/20/24 1106     (approximate)   History   Sciatica   HPI  Christina Holloway is a 63 y.o. female who presents with complaints of pain radiating from her right hip down to her right leg.  She has has been ongoing for about 3 weeks intermittently, worsened yesterday.  At her job clinic she received a dose of Toradol  which she states only helped for a brief period of time.  She was on a Medrol  Dosepak already.  She denies numbness of the leg, no weakness. walking is painful but she is able to do so.  Has had this intermittently over the last several years.  No back pain.     Physical Exam   Triage Vital Signs: ED Triage Vitals  Encounter Vitals Group     BP 06/20/24 1047 139/83     Girls Systolic BP Percentile --      Girls Diastolic BP Percentile --      Boys Systolic BP Percentile --      Boys Diastolic BP Percentile --      Pulse Rate 06/20/24 1047 89     Resp 06/20/24 1047 16     Temp 06/20/24 1047 98.3 F (36.8 C)     Temp Source 06/20/24 1047 Oral     SpO2 06/20/24 1047 98 %     Weight 06/20/24 1046 107 kg (236 lb)     Height 06/20/24 1046 1.702 m (5' 7)     Head Circumference --      Peak Flow --      Pain Score 06/20/24 1045 7     Pain Loc --      Pain Education --      Exclude from Growth Chart --     Most recent vital signs: Vitals:   06/20/24 1047  BP: 139/83  Pulse: 89  Resp: 16  Temp: 98.3 F (36.8 C)  SpO2: 98%     General: Awake, no distress.  CV:  Good peripheral perfusion.  Resp:  Normal effort.  Abd:  No distention.  No pulsatile mass Other:  Normal strength in the right lower extremity, normal sensation.  2+ distal pulses   ED Results / Procedures / Treatments   Labs (all labs ordered are listed, but only abnormal results are displayed) Labs Reviewed - No data to  display   EKG     RADIOLOGY     PROCEDURES:  Critical Care performed:   Procedures   MEDICATIONS ORDERED IN ED: Medications  dexamethasone  (DECADRON ) injection 10 mg (has no administration in time range)  oxyCODONE -acetaminophen  (PERCOCET/ROXICET) 5-325 MG per tablet 1 tablet (has no administration in time range)     IMPRESSION / MDM / ASSESSMENT AND PLAN / ED COURSE  I reviewed the triage vital signs and the nursing notes. Patient's presentation is most consistent with severe exacerbation of chronic illness.  Patient presents with likely exacerbation of sciatica, she has no weakness or numbness in the extremity.  Vital signs and exam are overall reassuring.  Discomfort is making it difficult for her to sleep at night.  Will treat with IM Decadron  10 mg, the patient is not diabetic.  Will give stronger analgesic for nighttime only.  During the day she has naproxen .  Will refer to spine for further evaluation and management        FINAL CLINICAL IMPRESSION(S) /  ED DIAGNOSES   Final diagnoses:  Sciatica of right side     Rx / DC Orders   ED Discharge Orders          Ordered    HYDROcodone -acetaminophen  (NORCO/VICODIN) 5-325 MG tablet  Every 6 hours PRN        06/20/24 1132             Note:  This document was prepared using Dragon voice recognition software and may include unintentional dictation errors.   Arlander Charleston, MD 06/20/24 1140

## 2024-07-24 ENCOUNTER — Ambulatory Visit (INDEPENDENT_AMBULATORY_CARE_PROVIDER_SITE_OTHER): Admitting: Urology

## 2024-07-24 VITALS — BP 109/70 | HR 88 | Ht 67.0 in | Wt 236.0 lb

## 2024-07-24 DIAGNOSIS — Z8551 Personal history of malignant neoplasm of bladder: Secondary | ICD-10-CM | POA: Diagnosis not present

## 2024-07-24 NOTE — Progress Notes (Signed)
 Bladder cancer surveillance note   UROLOGIC HISTORY Christina Holloway is a 63 y.o. 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  She underwent TURBT on 08/10/2019 with pathology showing high-grade T1 with no muscle invasion, and repeat TURBT on 09/14/2019 showed no evidence of malignancy.  She completed induction BCG in April 2021, and maintenance BCG in December 2023   She is a never smoker.   Initial Diagnosis of Bladder  Year: 07/2019 Pathology: High-grade T1, muscle present and not involved   Recurrent Bladder Cancer Diagnosis None   Treatments for Bladder Cancer 08/10/2019: Initial TURBT 3 cm, HG T1 09/14/2019: 2nd look TURBT, pathology negative 3-11/2019: Induction BCG x6 03/2020: mBCG #1 06/2020: mBCG #2 12/2020: mBCG #3 06/2021: mBCG #4 01/2022: mBCG #5 06/2022:mBCG #6     AUA Risk Category High   Cystoscopy Procedure Note: After informed consent and discussion of the procedure and its risks, Christina Holloway was positioned and prepped in the standard fashion. Cystoscopy was performed with the a flexible cystoscope. The urethra, bladder neck and entire bladder was visualized in a standard fashion, and mucosa was normal throughout.  Scar right lateral wall.  The ureteral orifices were visualized in their normal location and orientation.  No abnormalities on retroflexion.    CT urogram December 2024 with no urologic abnormalities.  Repeat CT ordered for routine surveillance every 1 to 2 years  RTC for cystoscopy 1 year, CT prior    Redell Burnet, MD 07/24/2024

## 2024-07-24 NOTE — Patient Instructions (Signed)

## 2024-07-25 ENCOUNTER — Other Ambulatory Visit: Payer: Self-pay | Admitting: Urology

## 2024-07-27 ENCOUNTER — Other Ambulatory Visit: Payer: Self-pay | Admitting: Physician Assistant

## 2024-07-27 DIAGNOSIS — Z1231 Encounter for screening mammogram for malignant neoplasm of breast: Secondary | ICD-10-CM

## 2024-07-30 ENCOUNTER — Inpatient Hospital Stay
Admission: RE | Admit: 2024-07-30 | Discharge: 2024-07-30 | Attending: Physician Assistant | Admitting: Physician Assistant

## 2024-07-30 DIAGNOSIS — Z1231 Encounter for screening mammogram for malignant neoplasm of breast: Secondary | ICD-10-CM

## 2024-08-06 ENCOUNTER — Other Ambulatory Visit: Payer: Self-pay | Admitting: Physician Assistant

## 2024-08-06 DIAGNOSIS — R928 Other abnormal and inconclusive findings on diagnostic imaging of breast: Secondary | ICD-10-CM

## 2024-08-08 ENCOUNTER — Inpatient Hospital Stay
Admission: RE | Admit: 2024-08-08 | Discharge: 2024-08-08 | Attending: Physician Assistant | Admitting: Physician Assistant

## 2024-08-08 ENCOUNTER — Other Ambulatory Visit: Payer: Self-pay | Admitting: Physician Assistant

## 2024-08-08 DIAGNOSIS — R928 Other abnormal and inconclusive findings on diagnostic imaging of breast: Secondary | ICD-10-CM | POA: Diagnosis present

## 2024-08-10 ENCOUNTER — Ambulatory Visit
Admission: RE | Admit: 2024-08-10 | Discharge: 2024-08-10 | Disposition: A | Discharge status: Home / Self Care | Source: Ambulatory Visit | Attending: Urology | Admitting: Urology

## 2024-08-10 DIAGNOSIS — Z8551 Personal history of malignant neoplasm of bladder: Secondary | ICD-10-CM | POA: Insufficient documentation

## 2024-08-10 MED ORDER — IOHEXOL 300 MG/ML  SOLN
100.0000 mL | Freq: Once | INTRAMUSCULAR | Status: AC | PRN
  Administered 2024-08-10: 100 mL via INTRAVENOUS

## 2024-08-13 ENCOUNTER — Ambulatory Visit: Payer: Self-pay | Admitting: Urology

## 2024-08-14 ENCOUNTER — Ambulatory Visit
Admission: RE | Admit: 2024-08-14 | Discharge: 2024-08-14 | Disposition: A | Discharge status: Home / Self Care | Source: Ambulatory Visit | Attending: Physician Assistant | Admitting: Physician Assistant

## 2024-08-14 ENCOUNTER — Other Ambulatory Visit: Payer: Self-pay | Admitting: Physician Assistant

## 2024-08-14 DIAGNOSIS — R928 Other abnormal and inconclusive findings on diagnostic imaging of breast: Secondary | ICD-10-CM | POA: Diagnosis present

## 2024-08-14 DIAGNOSIS — C50412 Malignant neoplasm of upper-outer quadrant of left female breast: Secondary | ICD-10-CM | POA: Diagnosis present

## 2024-08-14 HISTORY — PX: BREAST BIOPSY: SHX20

## 2024-08-14 MED ORDER — LIDOCAINE-EPINEPHRINE 1 %-1:100000 IJ SOLN
10.0000 mL | Freq: Once | INTRAMUSCULAR | Status: AC
  Administered 2024-08-14: 10 mL via INTRADERMAL
  Filled 2024-08-14: qty 10

## 2024-08-14 MED ORDER — LIDOCAINE 1 % OPTIME INJ - NO CHARGE
2.0000 mL | Freq: Once | INTRAMUSCULAR | Status: AC
  Administered 2024-08-14: 2 mL via INTRADERMAL
  Filled 2024-08-14: qty 2

## 2024-08-15 LAB — SURGICAL PATHOLOGY

## 2024-08-20 ENCOUNTER — Encounter: Payer: Self-pay | Admitting: *Deleted

## 2024-08-20 DIAGNOSIS — C50912 Malignant neoplasm of unspecified site of left female breast: Secondary | ICD-10-CM

## 2024-08-20 NOTE — Progress Notes (Signed)
 Received referral for newly diagnosed breast cancer from [facility].  Navigation initiated.  She will see Dr. Babara on Wed. At 11:15.  Referral placed to [facility] per patient preference, their office will call her with the appointment.

## 2024-08-22 ENCOUNTER — Inpatient Hospital Stay

## 2024-08-22 ENCOUNTER — Inpatient Hospital Stay: Attending: Oncology | Admitting: Oncology

## 2024-08-22 ENCOUNTER — Encounter: Payer: Self-pay | Admitting: *Deleted

## 2024-08-22 ENCOUNTER — Encounter: Payer: Self-pay | Admitting: Oncology

## 2024-08-22 VITALS — BP 140/89 | HR 93 | Temp 98.2°F | Ht 67.0 in | Wt 221.0 lb

## 2024-08-22 DIAGNOSIS — Z809 Family history of malignant neoplasm, unspecified: Secondary | ICD-10-CM

## 2024-08-22 DIAGNOSIS — Z1722 Progesterone receptor negative status: Secondary | ICD-10-CM | POA: Diagnosis not present

## 2024-08-22 DIAGNOSIS — Z171 Estrogen receptor negative status [ER-]: Secondary | ICD-10-CM | POA: Insufficient documentation

## 2024-08-22 DIAGNOSIS — Z803 Family history of malignant neoplasm of breast: Secondary | ICD-10-CM | POA: Diagnosis not present

## 2024-08-22 DIAGNOSIS — Z801 Family history of malignant neoplasm of trachea, bronchus and lung: Secondary | ICD-10-CM | POA: Insufficient documentation

## 2024-08-22 DIAGNOSIS — C50919 Malignant neoplasm of unspecified site of unspecified female breast: Secondary | ICD-10-CM | POA: Insufficient documentation

## 2024-08-22 DIAGNOSIS — C50912 Malignant neoplasm of unspecified site of left female breast: Secondary | ICD-10-CM | POA: Diagnosis present

## 2024-08-22 DIAGNOSIS — R058 Other specified cough: Secondary | ICD-10-CM

## 2024-08-22 DIAGNOSIS — R059 Cough, unspecified: Secondary | ICD-10-CM | POA: Insufficient documentation

## 2024-08-22 DIAGNOSIS — Z808 Family history of malignant neoplasm of other organs or systems: Secondary | ICD-10-CM | POA: Insufficient documentation

## 2024-08-22 DIAGNOSIS — Z9189 Other specified personal risk factors, not elsewhere classified: Secondary | ICD-10-CM

## 2024-08-22 LAB — CBC WITH DIFFERENTIAL (CANCER CENTER ONLY)
Abs Immature Granulocytes: 0.03 K/uL (ref 0.00–0.07)
Basophils Absolute: 0.1 K/uL (ref 0.0–0.1)
Basophils Relative: 1 %
Eosinophils Absolute: 0.1 K/uL (ref 0.0–0.5)
Eosinophils Relative: 1 %
HCT: 38.1 % (ref 36.0–46.0)
Hemoglobin: 12.8 g/dL (ref 12.0–15.0)
Immature Granulocytes: 0 %
Lymphocytes Relative: 23 %
Lymphs Abs: 1.6 K/uL (ref 0.7–4.0)
MCH: 30.4 pg (ref 26.0–34.0)
MCHC: 33.6 g/dL (ref 30.0–36.0)
MCV: 90.5 fL (ref 80.0–100.0)
Monocytes Absolute: 0.5 K/uL (ref 0.1–1.0)
Monocytes Relative: 7 %
Neutro Abs: 5 K/uL (ref 1.7–7.7)
Neutrophils Relative %: 68 %
Platelet Count: 331 K/uL (ref 150–400)
RBC: 4.21 MIL/uL (ref 3.87–5.11)
RDW: 13.5 % (ref 11.5–15.5)
WBC Count: 7.3 K/uL (ref 4.0–10.5)
nRBC: 0 % (ref 0.0–0.2)

## 2024-08-22 LAB — CMP (CANCER CENTER ONLY)
ALT: 38 U/L (ref 0–44)
AST: 33 U/L (ref 15–41)
Albumin: 4.8 g/dL (ref 3.5–5.0)
Alkaline Phosphatase: 77 U/L (ref 38–126)
Anion gap: 13 (ref 5–15)
BUN: 19 mg/dL (ref 8–23)
CO2: 28 mmol/L (ref 22–32)
Calcium: 10.7 mg/dL — ABNORMAL HIGH (ref 8.9–10.3)
Chloride: 100 mmol/L (ref 98–111)
Creatinine: 1.1 mg/dL — ABNORMAL HIGH (ref 0.44–1.00)
GFR, Estimated: 57 mL/min — ABNORMAL LOW
Glucose, Bld: 109 mg/dL — ABNORMAL HIGH (ref 70–99)
Potassium: 3.3 mmol/L — ABNORMAL LOW (ref 3.5–5.1)
Sodium: 141 mmol/L (ref 135–145)
Total Bilirubin: 0.4 mg/dL (ref 0.0–1.2)
Total Protein: 7.9 g/dL (ref 6.5–8.1)

## 2024-08-22 NOTE — Progress Notes (Signed)
 Accompanied patient and family to initial medical oncology appointment.   Reviewed Breast Cancer treatment handbook.   Care plan summary given to patient.   Reviewed outreach programs and cancer center services.

## 2024-08-22 NOTE — Addendum Note (Signed)
 Addended by: GEORGINA SHASTA POUR on: 08/22/2024 01:16 PM   Modules accepted: Orders

## 2024-08-23 DIAGNOSIS — R058 Other specified cough: Secondary | ICD-10-CM | POA: Insufficient documentation

## 2024-08-23 DIAGNOSIS — Z809 Family history of malignant neoplasm, unspecified: Secondary | ICD-10-CM | POA: Insufficient documentation

## 2024-08-23 LAB — CANCER ANTIGEN 15-3: CA 15-3: 11.6 U/mL (ref 0.0–25.0)

## 2024-08-23 LAB — CANCER ANTIGEN 27.29: CA 27.29: 15.7 U/mL (ref 0.0–38.6)

## 2024-08-23 NOTE — Assessment & Plan Note (Signed)
 Pathology and radiology counseling: Discussed with the patient, the details of pathology including the type of breast cancer,the clinical staging, the significance of ER, PR and HER-2/neu receptors and the implications for treatment. After reviewing the pathology in detail, we proceeded to discuss the different treatment options between surgery, radiation, chemotherapy, antiestrogen therapies.  cT1c cNx left invasive ductal carcinoma, ER negative, PR negative.  HER2 equivocal pending FISH  Treatment plan: 1 awaiting HER2 FISH results. 2 obtain diagnostic left mammogram with left axillary ultrasound evaluation. 3 neoadjuvant treatment followed by surgery versus surgery followed by adjuvant treatments depending on HER2 status.

## 2024-08-23 NOTE — Assessment & Plan Note (Signed)
 Possibly postviral cough versus other etiologies.  Observation.  Consider chest images if symptoms persist.

## 2024-08-23 NOTE — Assessment & Plan Note (Signed)
Recommend genetic counseling. 

## 2024-08-23 NOTE — Assessment & Plan Note (Addendum)
 Possibly due to hydrochlorothiazide . Plan to check PTH, PTHrp in the future

## 2024-08-23 NOTE — Progress Notes (Signed)
 " Hematology/Oncology Consult Note [phone] [phone]     REFERRING PROVIDER: Myrla Jon HERO, MD    CHIEF COMPLAINTS/PURPOSE OF CONSULTATION:  Left breast cancer.   ASSESSMENT & PLAN:   Invasive ductal carcinoma of breast [facility]) Pathology and radiology counseling: Discussed with the patient, the details of pathology including the type of breast cancer,the clinical staging, the significance of ER, PR and HER-2/neu receptors and the implications for treatment. After reviewing the pathology in detail, we proceeded to discuss the different treatment options between surgery, radiation, chemotherapy, antiestrogen therapies.  cT1c cNx left invasive ductal carcinoma, ER negative, PR negative.  HER2 equivocal pending FISH  Treatment plan: 1 awaiting HER2 FISH results. 2 obtain diagnostic left mammogram with left axillary ultrasound evaluation. 3 neoadjuvant treatment followed by surgery versus surgery followed by adjuvant treatments depending on HER2 status.  Family history of cancer Recommend genetic counseling.  Hypercalcemia Possibly due to hydrochlorothiazide . Plan to check PTH, PTHrp in the future  Dry cough Possibly postviral cough versus other etiologies.  Observation.  Consider chest images if symptoms persist.   Orders Placed This Encounter  Procedures   MM 3D DIAGNOSTIC MAMMOGRAM UNILATERAL LEFT BREAST    Standing Status:   Future    Expected Date:   08/29/2024    Expiration Date:   08/22/2025    Reason for Exam (SYMPTOM  OR DIAGNOSIS REQUIRED):   Infiltrating ductal carcinoma of left breast    Preferred imaging location?:   [facility]   US  LIMITED ULTRASOUND INCLUDING AXILLA LEFT BREAST     Standing Status:   Future    Expected Date:   08/29/2024    Expiration Date:   08/22/2025    Reason for Exam (SYMPTOM  OR DIAGNOSIS REQUIRED):   Infiltrating ductal carcinoma of left breast    Preferred imaging location?:   [facility]    Cancer antigen 27.29    Standing Status:   Future    Number of Occurrences:   1    Expected Date:   08/22/2024    Expiration Date:   11/20/2024   Cancer antigen 15-3    Standing Status:   Future    Number of Occurrences:   1    Expected Date:   08/22/2024    Expiration Date:   11/20/2024   CBC with Differential (Cancer Center Only)    Standing Status:   Future    Number of Occurrences:   1    Expected Date:   08/22/2024    Expiration Date:   11/20/2024   CMP (Cancer Center only)    Standing Status:   Future    Number of Occurrences:   1    Expected Date:   08/22/2024    Expiration Date:   11/20/2024   Ambulatory referral to Genetics    Referral Priority:   Routine    Referral Type:   Consultation    Referral Reason:   Specialty Services Required    Number of Visits Requested:   1    All questions were answered. The patient knows to call the clinic with any problems, questions or concerns.  Zelphia Cap, MD, PhD [facility] Hematology Oncology 08/22/2024    HISTORY OF PRESENTING ILLNESS:  Christina Holloway 64 y.o. female presents to establish care for left breast invasive ductal carcinoma. I have reviewed her chart and materials related to her cancer extensively and collaborated history with the patient. Summary of oncologic history is as follows: Oncology History  Invasive  ductal carcinoma of breast (HCC)  08/05/2024 Mammogram   Bilateral screening mammogram showed FINDINGS: In the left breast, a possible mass about the upper-outer breast, middle to posterior third, warrants further evaluation. In the right breast, no findings suspicious for malignancy.   IMPRESSION: Further evaluation is suggested for possible mass in the left breast.   RECOMMENDATION: Ultrasound of the left breast. (Code:US -L-22M)   08/08/2024 Imaging   Left breast ultrasound showed Near anechoic LEFT breast cyst versus mass, corresponding with the abnormality described at the time of screening.  Ultrasound-guided aspiration with possible conversion to biopsy is recommended.  RECOMMENDATION: Ultrasound-guided cyst aspiration with possible conversion to biopsy of the LEFT breast x1   08/22/2024 Initial Diagnosis   Invasive ductal carcinoma of breast   1. Breast, left, needle core biopsy, 2 o'clock, 16cmfn, 11mm, ribbon clip :       INVASIVE DUCTAL CARCINOMA, SEE NOTE       TUBULE FORMATION: SCORE 3       NUCLEAR PLEOMORPHISM: SCORE 3       MITOTIC COUNT: SCORE 2       TOTAL SCORE: 8       OVERALL GRADE: 3       LYMPHOVASCULAR INVASION: NOT IDENTIFIED       CANCER LENGTH: 1.1 CM       CALCIFICATIONS: NOT IDENTIFIED       OTHER FINDINGS: NONE         ER -, PR- HER2 equivocal   08/23/2024 Cancer Staging   Staging form: Breast, AJCC 8th Edition - Clinical stage from 08/23/2024: Stage Unknown (cT1c, cNX, cM0, G3, ER-, PR-, HER2: Equivocal) - Signed by Babara Call, MD on 08/23/2024 Stage prefix: Initial diagnosis Histologic grading system: 3 grade system    Patient presented to establish care.  Patient has significant family history of breast cancer, prostate cancer. She reports a dry cough since late November.  No unintentional weight loss, night sweats, chest pain, hemoptysis.  MEDICAL HISTORY:  Past Medical History:  Diagnosis Date   Abnormal LFTs 05/02/2015   Anemia, iron deficiency 08/24/2003   in past   Arthritis    lower back, legs   Calcium  blood increased 05/02/2015   Cancer (HCC)    Decreased potassium in the blood 05/02/2015   Essential (primary) hypertension 08/24/2003   Gout 05/02/2015   Hypercholesterolemia without hypertriglyceridemia 04/29/2009   Malaise and fatigue 05/02/2015   Sciatica    Shortness of breath dyspnea    1 flight stairs   Wears dentures    full upper, partial lower    SURGICAL HISTORY: Past Surgical History:  Procedure Laterality Date   BREAST BIOPSY Left 08/14/2024   US  LT BREAST BX W LOC DEV 1ST LESION IMG BX SPEC US  GUIDE  08/14/2024 [facility]   COLONOSCOPY WITH PROPOFOL  N/A 05/08/2015   Procedure: COLONOSCOPY WITH PROPOFOL ;  Surgeon: Rogelia Copping, MD;  Location: [facility];  Service: Endoscopy;  Laterality: N/A;   COLONOSCOPY WITH PROPOFOL  N/A 05/30/2018   Procedure: COLONOSCOPY WITH PROPOFOL ;  Surgeon: Unk Corinn Skiff, MD;  Location: [facility] ENDOSCOPY;  Service: Gastroenterology;  Laterality: N/A;   COLONOSCOPY WITH PROPOFOL  N/A 07/08/2023   Procedure: COLONOSCOPY WITH PROPOFOL ;  Surgeon: Unk Corinn Skiff, MD;  Location: [facility] ENDOSCOPY;  Service: Gastroenterology;  Laterality: N/A;   CYSTOSCOPY WITH FULGERATION  09/14/2019   Procedure: CYSTOSCOPY WITH FULGERATION;  Surgeon: Francisca Redell BROCKS, MD;  Location: [facility] ORS;  Service: Urology;;   ENDOMETRIAL ABLATION     POLYPECTOMY  05/08/2015  Procedure: POLYPECTOMY;  Surgeon: Rogelia Copping, MD;  Location: [facility];  Service: Endoscopy;;   POLYPECTOMY  07/08/2023   Procedure: POLYPECTOMY INTESTINAL;  Surgeon: Unk Corinn Skiff, MD;  Location: [facility] ENDOSCOPY;  Service: Gastroenterology;;   TRANSURETHRAL RESECTION OF BLADDER TUMOR WITH MITOMYCIN -C N/A 08/10/2019   Procedure: TRANSURETHRAL RESECTION OF BLADDER TUMOR WITH Gemcitabine ;  Surgeon: Francisca Redell BROCKS, MD;  Location: [facility] ORS;  Service: Urology;  Laterality: N/A;   VAGINAL HYSTERECTOMY      SOCIAL HISTORY: Social History   Socioeconomic History   Marital status: Single    Spouse name: Not on file   Number of children: Not on file   Years of education: Not on file   Highest education level: Not on file  Occupational History   Not on file  Tobacco Use   Smoking status: Never    Passive exposure: Never   Smokeless tobacco: Never  Vaping Use   Vaping status: Never Used  Substance and Sexual Activity   Alcohol use: Yes    Alcohol/week: 36.0 standard drinks of alcohol    Types: 36 Cans of beer per week    Comment: socially   Drug use: No   Sexual activity: Yes    Birth  control/protection: Surgical  Other Topics Concern   Not on file  Social History Narrative   Not on file   Social Drivers of Health   Tobacco Use: Low Risk (08/22/2024)   Patient History    Smoking Tobacco Use: Never    Smokeless Tobacco Use: Never    Passive Exposure: Never  Financial Resource Strain: Not on file  Food Insecurity: No Food Insecurity (08/22/2024)   Epic    Worried About Programme Researcher, Broadcasting/film/video in the Last Year: Never true    Ran Out of Food in the Last Year: Never true  Transportation Needs: No Transportation Needs (08/22/2024)   Epic    Lack of Transportation (Medical): No    Lack of Transportation (Non-Medical): No  Physical Activity: Not on file  Stress: Not on file  Social Connections: Not on file  Intimate Partner Violence: Not At Risk (08/22/2024)   Epic    Fear of Current or Ex-Partner: No    Emotionally Abused: No    Physically Abused: No    Sexually Abused: No  Depression (PHQ2-9): Low Risk (08/22/2024)   Depression (PHQ2-9)    PHQ-2 Score: 0  Alcohol Screen: Low Risk (06/03/2022)   Alcohol Screen    Last Alcohol Screening Score (AUDIT): 5  Housing: Low Risk (08/22/2024)   Epic    Unable to Pay for Housing in the Last Year: No    Number of Times Moved in the Last Year: 1    Homeless in the Last Year: No  Utilities: Not At Risk (08/22/2024)   Epic    Threatened with loss of utilities: No  Health Literacy: Not on file    FAMILY HISTORY: Family History  Problem Relation Age of Onset   Hypertension Mother    Thyroid disease Mother    Prostate cancer Father 47   AAA (abdominal aortic aneurysm) Sister    Breast cancer Paternal Grandmother 63   Breast cancer Cousin 40   Brain cancer Cousin    Lung cancer Cousin     ALLERGIES:  is allergic to penicillins.  MEDICATIONS:  Current Outpatient Medications  Medication Sig Dispense Refill   amLODipine  (NORVASC ) 10 MG tablet Take 1 tablet (10 mg total) by mouth daily. 90 tablet 2  buPROPion  (WELLBUTRIN XL) 300 MG 24 hr tablet Take 300 mg by mouth daily.     CALCIUM  PO Take by mouth.     colchicine  0.6 MG tablet TAKE 2 TABLETS BY MOUTH AT FIRST SIGN OF GOUT FLARE. THEN TAKE 1 TABLET 1 HOURLATER. CONTINUE 1 TABLET TWICE A DAY UNTIL 48 HOURS AFTER RESOLUTION OF FLARE (Patient taking differently: as needed. TAKE 2 TABLETS BY MOUTH AT FIRST SIGN OF GOUT FLARE. THEN TAKE 1 TABLET 1 HOURLATER. CONTINUE 1 TABLET TWICE A DAY UNTIL 48 HOURS AFTER RESOLUTION OF FLARE) 30 tablet 1   cyclobenzaprine  (FLEXERIL ) 5 MG tablet Take 1 tablet (5 mg total) by mouth 3 (three) times daily as needed for muscle spasms. 30 tablet 0   fluticasone  (FLONASE ) 50 MCG/ACT nasal spray Place 2 sprays into both nostrils daily. 16 g 6   hydrochlorothiazide  (HYDRODIURIL ) 25 MG tablet Take 1 tablet (25 mg total) by mouth daily. 90 tablet 2   HYDROcodone -acetaminophen  (NORCO/VICODIN) 5-325 MG tablet Take 1 tablet by mouth every 6 (six) hours as needed for moderate pain (pain score 4-6). 20 tablet 0   losartan  (COZAAR ) 100 MG tablet Take 1 tablet (100 mg total) by mouth daily. 90 tablet 2   naproxen  (NAPROSYN ) 500 MG tablet TAKE 1 TABLET BY MOUTH TWICE A DAY WITH MEALS 60 tablet 1   rosuvastatin  (CRESTOR ) 5 MG tablet Take 1 tablet (5 mg total) by mouth daily. 90 tablet 2   cetirizine  (ZYRTEC ) 10 MG tablet Take 1 tablet (10 mg total) by mouth daily. (Patient not taking: Reported on 08/22/2024) 90 tablet 3   No current facility-administered medications for this visit.    Review of Systems  Constitutional:  Negative for appetite change, chills, fatigue and fever.  HENT:   Negative for hearing loss and voice change.   Eyes:  Negative for eye problems.  Respiratory:  Positive for cough. Negative for chest tightness, hemoptysis and shortness of breath.   Cardiovascular:  Negative for chest pain.  Gastrointestinal:  Negative for abdominal distention, abdominal pain and blood in stool.  Endocrine: Negative for hot flashes.   Genitourinary:  Negative for difficulty urinating and frequency.   Musculoskeletal:  Negative for arthralgias.  Skin:  Negative for itching and rash.  Neurological:  Negative for extremity weakness.  Hematological:  Negative for adenopathy.  Psychiatric/Behavioral:  Negative for confusion.      PHYSICAL EXAMINATION: ECOG PERFORMANCE STATUS: 0 - Asymptomatic  Vitals:   08/22/24 1057 08/22/24 1116  BP: (!) 140/89 (!) 140/89  Pulse: 93   Temp: 98.2 F (36.8 C)   SpO2: 100%    Filed Weights   08/22/24 1057  Weight: 221 lb (100.2 kg)    Physical Exam Constitutional:      General: She is not in acute distress.    Appearance: She is not diaphoretic.  HENT:     Head: Normocephalic and atraumatic.  Eyes:     General: No scleral icterus. Cardiovascular:     Rate and Rhythm: Normal rate and regular rhythm.  Pulmonary:     Effort: Pulmonary effort is normal. No respiratory distress.     Breath sounds: No wheezing.  Abdominal:     General: There is no distension.     Palpations: Abdomen is soft.     Tenderness: There is no abdominal tenderness.  Musculoskeletal:        General: Normal range of motion.     Cervical back: Normal range of motion and neck supple.  Skin:  General: Skin is warm and dry.     Findings: No erythema.  Neurological:     Mental Status: She is alert and oriented to person, place, and time. Mental status is at baseline.     Motor: No abnormal muscle tone.  Psychiatric:        Mood and Affect: Mood and affect normal.    Breast exam was performed in seated and lying down position. Patient is status post left breast biopsy.  Palpable small left outer upper quadrant mass No palpable breast masses in right breast.  No palpable axillary adenopathy bilaterally.   LABORATORY DATA:  I have reviewed the data as listed    Latest Ref Rng & Units 08/22/2024   12:16 PM 03/21/2023    8:44 AM 06/04/2022    8:52 AM  CBC  WBC 4.0 - 10.5 K/uL 7.3  6.6  7.9    Hemoglobin 12.0 - 15.0 g/dL 87.1  87.5  87.1   Hematocrit 36.0 - 46.0 % 38.1  37.1  38.4   Platelets 150 - 400 K/uL 331  331  303       Latest Ref Rng & Units 08/22/2024   12:16 PM 03/21/2023    8:44 AM 06/11/2022    8:43 AM  CMP  Glucose 70 - 99 mg/dL 890  98  889   BUN 8 - 23 mg/dL 19  14  14    Creatinine 0.44 - 1.00 mg/dL 8.89  9.18  9.08   Sodium 135 - 145 mmol/L 141  143  142   Potassium 3.5 - 5.1 mmol/L 3.3  4.2  3.7   Chloride 98 - 111 mmol/L 100  104  100   CO2 22 - 32 mmol/L 28  24  25    Calcium  8.9 - 10.3 mg/dL 89.2  89.8  9.9   Total Protein 6.5 - 8.1 g/dL 7.9  6.8  7.2   Total Bilirubin 0.0 - 1.2 mg/dL 0.4  0.3  0.3   Alkaline Phos 38 - 126 U/L 77  70  70   AST 15 - 41 U/L 33  22  16   ALT 0 - 44 U/L 38  27  22      RADIOGRAPHIC STUDIES: I have personally reviewed the radiological images as listed and agreed with the findings in the report. US  LT BREAST BX W LOC DEV 1ST LESION IMG BX SPEC US  GUIDE Addendum Date: 08/22/2024 ADDENDUM REPORT: 08/22/2024 09:39 ADDENDUM: The patient's axillary lymph nodes were not evaluated at the time of diagnostic imaging. If sonographic evaluation of the LEFT axilla is clinically desired, that exam should be ordered separately. This was discussed with nurse navigator Shasta Ada via telephone. Electronically Signed   By: Norleen Croak M.D.   On: 08/22/2024 09:39   Addendum Date: 08/21/2024 ADDENDUM REPORT: 08/21/2024 07:46 ADDENDUM: PATHOLOGY revealed: 1. Breast, left, needle core biopsy, 2 o'clock, 16 cmfn, 11 mm, ribbon clip- INVASIVE DUCTAL CARCINOMA, OVERALL GRADE: 3- LYMPHOVASCULAR INVASION: NOT IDENTIFIED- CANCER LENGTH: 1.1 CM- CALCIFICATIONS: NOT IDENTIFIED Pathology results are CONCORDANT with imaging findings, per Dr. Norleen Croak. Pathology results and recommendations below were discussed with patient by telephone. Patient reported biopsy site doing well with no adverse symptoms, and only slight tenderness at the site. Post biopsy  care instructions were reviewed, questions were answered and my direct phone number was provided. Patient was instructed to call [facility] for any additional questions or concerns related to biopsy site. RECOMMENDATIONS: 1. Surgical and oncological consultation.  Request for surgical and oncological consultation was relayed to Shasta Ada, Nurse Navigator at [facility]. Pathology results reported by Mliss CHARM Molt RN 08/17/2024. Electronically Signed   By: Norleen Croak M.D.   On: 08/21/2024 07:46   Result Date: 08/22/2024 CLINICAL DATA:  Indeterminate LEFT breast cyst versus mass EXAM: ULTRASOUND GUIDED LEFT BREAST CORE NEEDLE BIOPSY COMPARISON:  Previous exam(s). PROCEDURE: I met with the patient and we discussed the procedure of ultrasound-guided aspiration and biopsy, including benefits and alternatives. We discussed the high likelihood of a successful procedure. We discussed the risks of the procedure, including infection, bleeding, tissue injury, clip migration, and inadequate sampling. Informed written consent was given. The usual time-out protocol was performed immediately prior to the procedure. Lesion quadrant: Upper-outer Well numbing, an attempt was made to aspirate the LEFT breast mass. When the abnormality fail to aspirate, we proceeded to biopsy as below. Using sterile technique and 1% Lidocaine  as local anesthetic, under direct ultrasound visualization, a 14 gauge spring-loaded device was used to perform biopsy of the LEFT breast mass at 2 o'clock 16 cm from the nipple using a anti radial approach. At the conclusion of the procedure ribbon shaped tissue marker clip was deployed into the biopsy cavity. Follow up 2 view mammogram was performed and dictated separately. IMPRESSION: Ultrasound guided biopsy of the LEFT breast mass at 2 o'clock 16 cm from the nipple. No apparent complications. Electronically Signed: By: Norleen Croak M.D. On: 08/14/2024 09:13   MM CLIP  PLACEMENT LEFT Result Date: 08/14/2024 CLINICAL DATA:  Status post LEFT breast biopsy EXAM: 3D DIAGNOSTIC LEFT MAMMOGRAM POST ULTRASOUND BIOPSY COMPARISON:  Previous exam(s). ACR Breast Density Category b: There are scattered areas of fibroglandular density. FINDINGS: 3D Mammographic images were obtained following ultrasound guided biopsy of the LEFT breast mass at 2 o'clock 16 cm from the nipple. The biopsy marking clip is in expected position at the site of biopsy. IMPRESSION: Appropriate positioning of the ribbon shaped biopsy marking clip at the site of biopsy in the LEFT breast. Final Assessment: Post Procedure Mammograms for Marker Placement Electronically Signed   By: Norleen Croak M.D.   On: 08/14/2024 09:08   CT HEMATURIA WORKUP Result Date: 08/10/2024 CLINICAL DATA:  History of bladder cancer.  * Tracking Code: BO * EXAM: CT ABDOMEN AND PELVIS WITHOUT AND WITH CONTRAST TECHNIQUE: Multidetector CT imaging of the abdomen and pelvis was performed following the standard protocol before and following the bolus administration of intravenous contrast. RADIATION DOSE REDUCTION: This exam was performed according to the departmental dose-optimization program which includes automated exposure control, adjustment of the mA and/or kV according to patient size and/or use of iterative reconstruction technique. CONTRAST:  OMNIPAQUE  IOHEXOL  300 MG/ML  SOLN COMPARISON:  CT scan abdomen pelvis from 08/16/2023. FINDINGS: Lower chest: There are patchy atelectatic changes in the visualized lung bases. No overt consolidation. No pleural effusion. The heart is normal in size. No pericardial effusion. Hepatobiliary: The liver is normal in size. Non-cirrhotic configuration. No suspicious mass. These is mild diffuse hepatic steatosis. No intrahepatic or extrahepatic bile duct dilation. No calcified gallstones. Normal gallbladder wall thickness. No pericholecystic inflammatory changes. Pancreas: Unremarkable. No pancreatic  ductal dilatation or surrounding inflammatory changes. Spleen: Within normal limits. No focal lesion. Adrenals/Urinary Tract: Adrenal glands are unremarkable. No suspicious renal mass. There is a subcentimeter sized simple cyst in the right kidney upper pole, anteriorly. No hydronephrosis. no renal or ureteric calculi. Unremarkable urinary bladder. No focal mass. No perivesical fat  stranding or bladder calculi. Stomach/Bowel: No disproportionate dilation of the small or large bowel loops. No evidence of abnormal bowel wall thickening or inflammatory changes. The appendix is unremarkable. There are multiple diverticula throughout the colon, without imaging signs of diverticulitis. Vascular/Lymphatic: No ascites or pneumoperitoneum. No abdominal or pelvic lymphadenopathy, by size criteria. No aneurysmal dilation of the major abdominal arteries. There are mild peripheral atherosclerotic vascular calcifications of the aorta and its major branches. Reproductive: The uterus is surgically absent. No large adnexal mass. Other: There is a tiny fat containing umbilical hernia. The soft tissues and abdominal wall are otherwise unremarkable. Musculoskeletal: No suspicious osseous lesions. There are mild - moderate multilevel degenerative changes in the visualized spine. IMPRESSION: 1. No metastatic bladder cancer seen within the abdomen or pelvis. 2. Multiple other nonacute observations, as described above. Aortic Atherosclerosis (ICD10-I70.0). Electronically Signed   By: Ree Molt M.D.   On: 08/10/2024 16:17   US  LIMITED ULTRASOUND INCLUDING AXILLA LEFT BREAST  Result Date: 08/08/2024 CLINICAL DATA:  Screening recall LEFT breast mass, ultrasound only recommended EXAM: ULTRASOUND OF THE LEFT BREAST COMPARISON:  Prior examinations FINDINGS: On physical exam, a small palpable nodule is noted in the upper-outer quadrant of the LEFT breast at the 2 o'clock position 16 cm from the nipple Targeted ultrasound is performed,  showing an oval, near anechoic mass measuring 11 x 8 x 10 mm which is mostly circumscribed but has partially indistinct margins. There is mild surrounding hypervascularity. This corresponds with the mammographic finding. IMPRESSION: Near anechoic LEFT breast cyst versus mass, corresponding with the abnormality described at the time of screening. Ultrasound-guided aspiration with possible conversion to biopsy is recommended. RECOMMENDATION: Ultrasound-guided cyst aspiration with possible conversion to biopsy of the LEFT breast x1 I have discussed the findings and recommendations with the patient. The recommended procedure was discussed with the patient and questions were answered. Patient expressed their understanding of the recommendation. Patient will be scheduled for the procedure at her earliest convenience by the schedulers. Ordering provider will be notified. If applicable, a reminder letter will be sent to the patient regarding the next appointment. BI-RADS CATEGORY  4: Suspicious. Electronically Signed   By: Norleen Croak M.D.   On: 08/08/2024 13:28   MM 3D SCREENING MAMMOGRAM BILATERAL BREAST Result Date: 08/05/2024 CLINICAL DATA:  Screening. EXAM: DIGITAL SCREENING BILATERAL MAMMOGRAM WITH TOMOSYNTHESIS AND CAD TECHNIQUE: Bilateral screening digital craniocaudal and mediolateral oblique mammograms were obtained. Bilateral screening digital breast tomosynthesis was performed. The images were evaluated with computer-aided detection. COMPARISON:  Previous exam(s). ACR Breast Density Category b: There are scattered areas of fibroglandular density. FINDINGS: In the left breast, a possible mass about the upper-outer breast, middle to posterior third, warrants further evaluation. In the right breast, no findings suspicious for malignancy. IMPRESSION: Further evaluation is suggested for possible mass in the left breast. RECOMMENDATION: Ultrasound of the left breast. (Code:US -L-65M) The patient will be contacted  regarding the findings, and additional imaging will be scheduled. BI-RADS CATEGORY  0: Incomplete: Need additional imaging evaluation. Electronically Signed   By: Curtistine Noble   On: 08/05/2024 08:31    "

## 2024-08-27 ENCOUNTER — Ambulatory Visit
Admission: RE | Admit: 2024-08-27 | Discharge: 2024-08-27 | Disposition: A | Discharge status: Home / Self Care | Source: Ambulatory Visit | Attending: Oncology | Admitting: Oncology

## 2024-08-27 DIAGNOSIS — C50912 Malignant neoplasm of unspecified site of left female breast: Secondary | ICD-10-CM | POA: Insufficient documentation

## 2024-08-29 ENCOUNTER — Encounter: Payer: Self-pay | Admitting: Oncology

## 2024-08-29 ENCOUNTER — Telehealth: Payer: Self-pay | Admitting: Surgery

## 2024-08-29 ENCOUNTER — Telehealth: Payer: Self-pay | Admitting: Oncology

## 2024-08-29 ENCOUNTER — Inpatient Hospital Stay: Admitting: Licensed Clinical Social Worker

## 2024-08-29 ENCOUNTER — Inpatient Hospital Stay: Attending: Oncology | Admitting: Oncology

## 2024-08-29 ENCOUNTER — Inpatient Hospital Stay

## 2024-08-29 VITALS — BP 129/86 | HR 81 | Temp 97.6°F | Resp 17 | Wt 221.6 lb

## 2024-08-29 DIAGNOSIS — Z8739 Personal history of other diseases of the musculoskeletal system and connective tissue: Secondary | ICD-10-CM | POA: Insufficient documentation

## 2024-08-29 DIAGNOSIS — Z808 Family history of malignant neoplasm of other organs or systems: Secondary | ICD-10-CM | POA: Insufficient documentation

## 2024-08-29 DIAGNOSIS — R059 Cough, unspecified: Secondary | ICD-10-CM | POA: Insufficient documentation

## 2024-08-29 DIAGNOSIS — Z803 Family history of malignant neoplasm of breast: Secondary | ICD-10-CM | POA: Insufficient documentation

## 2024-08-29 DIAGNOSIS — C50912 Malignant neoplasm of unspecified site of left female breast: Secondary | ICD-10-CM

## 2024-08-29 DIAGNOSIS — Z79620 Long term (current) use of immunosuppressive biologic: Secondary | ICD-10-CM | POA: Insufficient documentation

## 2024-08-29 DIAGNOSIS — Z5111 Encounter for antineoplastic chemotherapy: Secondary | ICD-10-CM | POA: Diagnosis present

## 2024-08-29 DIAGNOSIS — C50412 Malignant neoplasm of upper-outer quadrant of left female breast: Secondary | ICD-10-CM | POA: Diagnosis not present

## 2024-08-29 DIAGNOSIS — Z801 Family history of malignant neoplasm of trachea, bronchus and lung: Secondary | ICD-10-CM | POA: Diagnosis not present

## 2024-08-29 DIAGNOSIS — R058 Other specified cough: Secondary | ICD-10-CM | POA: Diagnosis not present

## 2024-08-29 DIAGNOSIS — Z8551 Personal history of malignant neoplasm of bladder: Secondary | ICD-10-CM

## 2024-08-29 DIAGNOSIS — Z23 Encounter for immunization: Secondary | ICD-10-CM | POA: Insufficient documentation

## 2024-08-29 DIAGNOSIS — Z5112 Encounter for antineoplastic immunotherapy: Secondary | ICD-10-CM | POA: Diagnosis present

## 2024-08-29 MED ORDER — LIDOCAINE-PRILOCAINE 2.5-2.5 % EX CREA: TOPICAL_CREAM | CUTANEOUS | 3 refills | Status: DC

## 2024-08-29 MED ORDER — PROCHLORPERAZINE MALEATE 10 MG PO TABS: 10.0000 mg | ORAL_TABLET | Freq: Four times a day (QID) | ORAL | 1 refills | Status: AC | PRN

## 2024-08-29 MED ORDER — DEXAMETHASONE 4 MG PO TABS: ORAL_TABLET | ORAL | 1 refills | Status: AC

## 2024-08-29 MED ORDER — ONDANSETRON HCL 8 MG PO TABS: 8.0000 mg | ORAL_TABLET | Freq: Three times a day (TID) | ORAL | 1 refills | Status: AC | PRN

## 2024-08-29 NOTE — Telephone Encounter (Signed)
 Per secure chat - add flu shot tomorrow since pt will be here for education.   I called and confirmed the time with pt

## 2024-08-29 NOTE — Telephone Encounter (Signed)
 Called pt to sched CT - pt confirmed date/time/location - pt requested appt reminder via mail - LH

## 2024-08-29 NOTE — Assessment & Plan Note (Addendum)
 Recent CT hematuria work up did not show metastatic bladder cancer seen within the abdomen or pelvis

## 2024-08-29 NOTE — Assessment & Plan Note (Addendum)
 Pathology and radiology counseling: Discussed with the patient, the details of pathology including the type of breast cancer,the clinical staging, the significance of ER, PR and HER-2/neu receptors and the implications for treatment. After reviewing the pathology in detail, we proceeded to discuss the different treatment options between surgery, radiation, chemotherapy, antiestrogen therapies.  cT1c cN0 left invasive ductal carcinoma, Triple negative Genetic counseling pending.  Recommend neoadjuvant chemotherapy followed by surgery radiation, adjuvant treatment.  She is not interested in clinical trial.  Recommend neoadjuvant carboplatin Taxol Keytruda followed by [facility] + Keytruda.  I explained to the patient the risks and benefits of chemotherapy including all but not limited to infusion reaction, hair loss, hearing loss, mouth sore, nausea, vomiting, low blood counts, bleeding, heart failure, kidney failure, neuropathy and risk of life threatening infection and even death, secondary malignancy etc.  I discussed the mechanism of action and rationale of using immunotherapy.  Discussed the potential side effects of immunotherapy including but not limited to colitis/diarrhea; skin rash; pneumonitis/respiratory failure, nephritis, muscle/joint pain, elevated LFTs/liver failure,endocrine abnormalities, neuropathy, etc. Patient voices understanding and agrees with proceeding with treatment.  Patient voices understanding and willing to proceed chemotherapy.   # Chemotherapy education; Medi port placement by surgeon.  Antiemetics-Zofran  and Compazine ; EMLA  cream sent to pharmacy Supportive care measures are necessary for patient well-being and will be provided as necessary. We spent sufficient time to discuss many aspect of care, questions were answered to patient's satisfaction.

## 2024-08-29 NOTE — Progress Notes (Signed)
 START ON PATHWAY REGIMEN - Breast     Cycles 1 through 4: A cycle is every 21 days:     Pembrolizumab      Paclitaxel      Carboplatin      Filgrastim-xxxx    Cycles 5 through 8: A cycle is every 21 days:     Pembrolizumab      Doxorubicin      Cyclophosphamide      Pegfilgrastim-xxxx   **Always confirm dose/schedule in your pharmacy ordering system**  Patient Characteristics: Preoperative or Nonsurgical Candidate, M0 (Clinical Staging), Up to cT4c, Any N, M0, Neoadjuvant Therapy followed by Surgery, Invasive Disease, Chemotherapy, HER2 Negative, ER Negative, Platinum Therapy Indicated and Candidate for Checkpoint Inhibitor Therapeutic Status: Preoperative or Nonsurgical Candidate, M0 (Clinical Staging) AJCC T Category: cT1c AJCC N Category: cN0 AJCC M Category: cM0 AJCC Grade: G3 ER Status: Negative (-) PR Status: Negative (-) HER2 Status: Negative (-) AJCC 8 Stage Grouping: IB Breast Surgical Plan: Neoadjuvant Therapy followed by Surgery Intent of Therapy: Curative Intent, Discussed with Patient

## 2024-08-29 NOTE — Progress Notes (Signed)
 " Hematology/Oncology Consult Note [phone] [phone]     REFERRING PROVIDER: Myrla Jon HERO, MD    CHIEF COMPLAINTS/PURPOSE OF CONSULTATION:  Left breast cancer.   ASSESSMENT & PLAN:   Invasive ductal carcinoma of breast [facility]) Pathology and radiology counseling: Discussed with the patient, the details of pathology including the type of breast cancer,the clinical staging, the significance of ER, PR and HER-2/neu receptors and the implications for treatment. After reviewing the pathology in detail, we proceeded to discuss the different treatment options between surgery, radiation, chemotherapy, antiestrogen therapies.  cT1c cN0 left invasive ductal carcinoma, Triple negative Recommend neoadjuvant chemotherapy followed by surgery radiation, adjuvant treatment.  She is not interested in clinical trial.  Recommend neoadjuvant carboplatin Taxol Keytruda followed by [facility] + Keytruda.  I explained to the patient the risks and benefits of chemotherapy including all but not limited to infusion reaction, hair loss, hearing loss, mouth sore, nausea, vomiting, low blood counts, bleeding, heart failure, kidney failure, neuropathy and risk of life threatening infection and even death, secondary malignancy etc.  I discussed the mechanism of action and rationale of using immunotherapy.  Discussed the potential side effects of immunotherapy including but not limited to colitis/diarrhea; skin rash; pneumonitis/respiratory failure, nephritis, muscle/joint pain, elevated LFTs/liver failure,endocrine abnormalities, neuropathy, etc. Patient voices understanding and agrees with proceeding with treatment.  Patient voices understanding and willing to proceed chemotherapy.   # Chemotherapy education; Medi port placement by surgeon.  Antiemetics-Zofran  and Compazine ; EMLA  cream sent to pharmacy Supportive care measures are necessary for patient well-being and will be provided as  necessary. We spent sufficient time to discuss many aspect of care, questions were answered to patient's satisfaction.   Dry cough Persistent dry cough. Check CT chest images.    History of primary malignant neoplasm of urinary bladder Recent CT hematuria work up did not show metastatic bladder cancer seen within the abdomen or pelvis   Hypercalcemia Possibly due to dehydration or hydrochlorothiazide . Plan to check PTH, PTHrp in the future   Orders Placed This Encounter  Procedures   Consent Attestation for Oncology Treatment    The patient is informed of risks, benefits, side-effects of the prescribed oncology treatment. Potential short term and long term side effects and response rates discussed. After a long discussion, the patient made informed decision to proceed.:   Yes   CT Chest Wo Contrast    Standing Status:   Future    Expected Date:   09/05/2024    Expiration Date:   08/29/2025    Preferred imaging location?:   [facility]   CBC with Differential (Cancer Center Only)    Standing Status:   Future    Expected Date:   09/11/2024    Expiration Date:   09/11/2025   CMP (Cancer Center only)    Standing Status:   Future    Expected Date:   09/11/2024    Expiration Date:   09/11/2025   T4    Standing Status:   Future    Expected Date:   09/11/2024    Expiration Date:   09/11/2025   TSH    Standing Status:   Future    Expected Date:   09/11/2024    Expiration Date:   09/11/2025   CBC with Differential (Cancer Center Only)    Standing Status:   Future    Expected Date:   09/18/2024    Expiration Date:   09/18/2025   CMP (Cancer Center only)    Standing Status:  Future    Expected Date:   09/18/2024    Expiration Date:   09/18/2025   CBC with Differential (Cancer Center Only)    Standing Status:   Future    Expected Date:   09/25/2024    Expiration Date:   09/25/2025   CMP (Cancer Center only)    Standing Status:   Future    Expected Date:   09/25/2024    Expiration Date:    09/25/2025   Parathyroid hormone, intact (no Ca)    Standing Status:   Future    Expected Date:   09/11/2024    Expiration Date:   09/11/2025   PTH-related peptide    Standing Status:   Future    Expected Date:   09/11/2024    Expiration Date:   09/11/2025   PHYSICIAN COMMUNICATION ORDER    A baseline Echo/ Muga should be obtained prior to initiation of Anthracycline Chemotherapy   ONCBCN PHYSICIAN COMMUNICATION 1    Required labs: Thyroid function tests at baseline and every 3rd cycle.   ONCBCN PHYSICIAN COMMUNICATION 2    In the KEYNOTE-522 trial Maida et al., 2020), G-CSF was recommended after each chemotherapy cycle beginning 24 hours after the last dose of chemotherapy. For the paclitaxel + carboplatin cycles, filgrastim was recommended and it continued daily at least until 72 hours after the last dose of chemotherapy.    All questions were answered. The patient knows to call the clinic with any problems, questions or concerns.  Zelphia Cap, MD, PhD [facility] Hematology Oncology 08/29/2024    HISTORY OF PRESENTING ILLNESS:  Christina Holloway 64 y.o. female presents to establish care for left breast invasive ductal carcinoma, Triple negative.  I have reviewed her chart and materials related to her cancer extensively and collaborated history with the patient. Summary of oncologic history is as follows: Oncology History  Invasive ductal carcinoma of breast (HCC)  08/05/2024 Mammogram   Bilateral screening mammogram showed FINDINGS: In the left breast, a possible mass about the upper-outer breast, middle to posterior third, warrants further evaluation. In the right breast, no findings suspicious for malignancy.   IMPRESSION: Further evaluation is suggested for possible mass in the left breast.   RECOMMENDATION: Ultrasound of the left breast. (Code:US -L-35M)   08/08/2024 Imaging   Left breast ultrasound showed Near anechoic LEFT breast cyst versus mass, corresponding with  the abnormality described at the time of screening. Ultrasound-guided aspiration with possible conversion to biopsy is recommended.  RECOMMENDATION: Ultrasound-guided cyst aspiration with possible conversion to biopsy of the LEFT breast x1   08/22/2024 Initial Diagnosis   Invasive ductal carcinoma of breast   1. Breast, left, needle core biopsy, 2 o'clock, 16cmfn, 11mm, ribbon clip :       INVASIVE DUCTAL CARCINOMA, SEE NOTE       TUBULE FORMATION: SCORE 3       NUCLEAR PLEOMORPHISM: SCORE 3       MITOTIC COUNT: SCORE 2       TOTAL SCORE: 8       OVERALL GRADE: 3       LYMPHOVASCULAR INVASION: NOT IDENTIFIED       CANCER LENGTH: 1.1 CM       CALCIFICATIONS: NOT IDENTIFIED       OTHER FINDINGS: NONE         ER -, PR- HER2 -   08/23/2024 Cancer Staging   Staging form: Breast, AJCC 8th Edition - Clinical stage from 08/23/2024: Stage IB (cT1c, cN0, cM0,  G3, ER-, PR-, HER2-) - Signed by Babara Call, MD on 08/29/2024 Stage prefix: Initial diagnosis Histologic grading system: 3 grade system   09/11/2024 -  Chemotherapy   Patient is on Treatment Plan : BREAST Pembrolizumab (200) D1 + Carboplatin (1.5) D1,8,15 + Paclitaxel (80) D1,8,15 q21d X 4 cycles / Pembrolizumab (200) D1 + AC D1 q21d x 4 cycles      Patient presented to discuss about pathology and treatment plan   Patient has significant family history of breast cancer, prostate cancer. She reports persistent dry cough since late November.  No unintentional weight loss, night sweats, chest pain, hemoptysis.  MEDICAL HISTORY:  Past Medical History:  Diagnosis Date   Abnormal LFTs 05/02/2015   Anemia, iron deficiency 08/24/2003   in past   Arthritis    lower back, legs   Calcium  blood increased 05/02/2015   Cancer (HCC)    Decreased potassium in the blood 05/02/2015   Essential (primary) hypertension 08/24/2003   Gout 05/02/2015   Hypercholesterolemia without hypertriglyceridemia 04/29/2009   Malaise and fatigue 05/02/2015    Sciatica    Shortness of breath dyspnea    1 flight stairs   Wears dentures    full upper, partial lower    SURGICAL HISTORY: Past Surgical History:  Procedure Laterality Date   BREAST BIOPSY Left 08/14/2024   US  LT BREAST BX W LOC DEV 1ST LESION IMG BX SPEC US  GUIDE 08/14/2024 [facility]   COLONOSCOPY WITH PROPOFOL  N/A 05/08/2015   Procedure: COLONOSCOPY WITH PROPOFOL ;  Surgeon: Rogelia Copping, MD;  Location: [facility];  Service: Endoscopy;  Laterality: N/A;   COLONOSCOPY WITH PROPOFOL  N/A 05/30/2018   Procedure: COLONOSCOPY WITH PROPOFOL ;  Surgeon: Unk Corinn Skiff, MD;  Location: [facility];  Service: Gastroenterology;  Laterality: N/A;   COLONOSCOPY WITH PROPOFOL  N/A 07/08/2023   Procedure: COLONOSCOPY WITH PROPOFOL ;  Surgeon: Unk Corinn Skiff, MD;  Location: [facility] END[facility]COPY;  Service: Gastroenterology;  Laterality: N/A;   CYSTOSCOPY WITH FULGERATION  09/14/2019   Procedure: CYSTOSCOPY WITH FULGERATION;  Surgeon: Francisca Redell BROCKS, MD;  Location: [facility] ORS;  Service: Urology;;   ENDOMETRIAL ABLATION     POLYPECTOMY  05/08/2015   Procedure: POLYPECTOMY;  Surgeon: Rogelia Copping, MD;  Location: [facility];  Service: Endoscopy;;   POLYPECTOMY  07/08/2023   Procedure: POLYPECTOMY INTESTINAL;  Surgeon: Unk Corinn Skiff, MD;  Location: [facility] ENDOSCOPY;  Service: Gastroenterology;;   TRANSURETHRAL RESECTION OF BLADDER TUMOR WITH MITOMYCIN -C N/A 08/10/2019   Procedure: TRANSURETHRAL RESECTION OF BLADDER TUMOR WITH Gemcitabine ;  Surgeon: Francisca Redell BROCKS, MD;  Location: [facility] ORS;  Service: Urology;  Laterality: N/A;   VAGINAL HYSTERECTOMY      SOCIAL HISTORY: Social History   Socioeconomic History   Marital status: Single    Spouse name: Not on file   Number of children: Not on file   Years of education: Not on file   Highest education level: Not on file  Occupational History   Not on file  Tobacco Use   Smoking status: Never    Passive exposure: Never    Smokeless tobacco: Never  Vaping Use   Vaping status: Never Used  Substance and Sexual Activity   Alcohol use: Yes    Alcohol/week: 36.0 standard drinks of alcohol    Types: 36 Cans of beer per week    Comment: socially   Drug use: No   Sexual activity: Yes    Birth control/protection: Surgical  Other Topics Concern   Not on file  Social History  Narrative   Not on file   Social Drivers of Health   Tobacco Use: Low Risk (08/29/2024)   Patient History    Smoking Tobacco Use: Never    Smokeless Tobacco Use: Never    Passive Exposure: Never  Financial Resource Strain: Not on file  Food Insecurity: No Food Insecurity (08/22/2024)   Epic    Worried About Programme Researcher, Broadcasting/film/video in the Last Year: Never true    Ran Out of Food in the Last Year: Never true  Transportation Needs: No Transportation Needs (08/22/2024)   Epic    Lack of Transportation (Medical): No    Lack of Transportation (Non-Medical): No  Physical Activity: Not on file  Stress: Not on file  Social Connections: Not on file  Intimate Partner Violence: Not At Risk (08/22/2024)   Epic    Fear of Current or Ex-Partner: No    Emotionally Abused: No    Physically Abused: No    Sexually Abused: No  Depression (PHQ2-9): Low Risk (08/22/2024)   Depression (PHQ2-9)    PHQ-2 Score: 0  Alcohol Screen: Low Risk (06/03/2022)   Alcohol Screen    Last Alcohol Screening Score (AUDIT): 5  Housing: Low Risk (08/22/2024)   Epic    Unable to Pay for Housing in the Last Year: No    Number of Times Moved in the Last Year: 1    Homeless in the Last Year: No  Utilities: Not At Risk (08/22/2024)   Epic    Threatened with loss of utilities: No  Health Literacy: Not on file    FAMILY HISTORY: Family History  Problem Relation Age of Onset   Hypertension Mother    Thyroid disease Mother    Prostate cancer Father 26   AAA (abdominal aortic aneurysm) Sister    Breast cancer Paternal Grandmother 59   Breast cancer Cousin 57   Brain  cancer Cousin    Lung cancer Cousin     ALLERGIES:  is allergic to penicillins.  MEDICATIONS:  Current Outpatient Medications  Medication Sig Dispense Refill   amLODipine  (NORVASC ) 10 MG tablet Take 1 tablet (10 mg total) by mouth daily. 90 tablet 2   CALCIUM  PO Take by mouth.     cetirizine  (ZYRTEC ) 10 MG tablet Take 1 tablet (10 mg total) by mouth daily. 90 tablet 3   colchicine  0.6 MG tablet TAKE 2 TABLETS BY MOUTH AT FIRST SIGN OF GOUT FLARE. THEN TAKE 1 TABLET 1 HOURLATER. CONTINUE 1 TABLET TWICE A DAY UNTIL 48 HOURS AFTER RESOLUTION OF FLARE (Patient taking differently: as needed. TAKE 2 TABLETS BY MOUTH AT FIRST SIGN OF GOUT FLARE. THEN TAKE 1 TABLET 1 HOURLATER. CONTINUE 1 TABLET TWICE A DAY UNTIL 48 HOURS AFTER RESOLUTION OF FLARE) 30 tablet 1   cyclobenzaprine  (FLEXERIL ) 5 MG tablet Take 1 tablet (5 mg total) by mouth 3 (three) times daily as needed for muscle spasms. 30 tablet 0   hydrochlorothiazide  (HYDRODIURIL ) 25 MG tablet Take 1 tablet (25 mg total) by mouth daily. 90 tablet 2   losartan  (COZAAR ) 100 MG tablet Take 1 tablet (100 mg total) by mouth daily. 90 tablet 2   naproxen  (NAPROSYN ) 500 MG tablet TAKE 1 TABLET BY MOUTH TWICE A DAY WITH MEALS 60 tablet 1   rosuvastatin  (CRESTOR ) 5 MG tablet Take 1 tablet (5 mg total) by mouth daily. 90 tablet 2   buPROPion (WELLBUTRIN XL) 300 MG 24 hr tablet Take 300 mg by mouth daily. (Patient not taking: Reported on  08/29/2024)     dexamethasone  (DECADRON ) 4 MG tablet Take 2 tablets daily for 2 days, start the day after chemotherapy. Take with food. 30 tablet 1   fluticasone  (FLONASE ) 50 MCG/ACT nasal spray Place 2 sprays into both nostrils daily. (Patient not taking: Reported on 08/29/2024) 16 g 6   HYDROcodone -acetaminophen  (NORCO/VICODIN) 5-325 MG tablet Take 1 tablet by mouth every 6 (six) hours as needed for moderate pain (pain score 4-6). (Patient not taking: Reported on 08/29/2024) 20 tablet 0   lidocaine -prilocaine  (EMLA ) cream Apply to  affected area once 30 g 3   ondansetron  (ZOFRAN ) 8 MG tablet Take 1 tablet (8 mg total) by mouth every 8 (eight) hours as needed for nausea or vomiting. Start on the third day after chemotherapy. 30 tablet 1   prochlorperazine  (COMPAZINE ) 10 MG tablet Take 1 tablet (10 mg total) by mouth every 6 (six) hours as needed for nausea or vomiting. 30 tablet 1   No current facility-administered medications for this visit.    Review of Systems  Constitutional:  Negative for appetite change, chills, fatigue and fever.  HENT:   Negative for hearing loss and voice change.   Eyes:  Negative for eye problems.  Respiratory:  Positive for cough. Negative for chest tightness, hemoptysis and shortness of breath.   Cardiovascular:  Negative for chest pain.  Gastrointestinal:  Negative for abdominal distention, abdominal pain and blood in stool.  Endocrine: Negative for hot flashes.  Genitourinary:  Negative for difficulty urinating and frequency.   Musculoskeletal:  Negative for arthralgias.  Skin:  Negative for itching and rash.  Neurological:  Negative for extremity weakness.  Hematological:  Negative for adenopathy.  Psychiatric/Behavioral:  Negative for confusion.      PHYSICAL EXAMINATION: ECOG PERFORMANCE STATUS: 0 - Asymptomatic  Vitals:   08/29/24 0829  BP: 129/86  Pulse: 81  Resp: 17  Temp: 97.6 F (36.4 C)  SpO2: 97%   Filed Weights   08/29/24 0829  Weight: 221 lb 9.6 oz (100.5 kg)    Physical Exam Constitutional:      General: She is not in acute distress.    Appearance: She is not diaphoretic.  HENT:     Head: Normocephalic and atraumatic.  Eyes:     General: No scleral icterus. Cardiovascular:     Rate and Rhythm: Normal rate and regular rhythm.  Pulmonary:     Effort: Pulmonary effort is normal. No respiratory distress.     Breath sounds: No wheezing.  Abdominal:     General: There is no distension.     Palpations: Abdomen is soft.     Tenderness: There is no  abdominal tenderness.  Musculoskeletal:        General: Normal range of motion.     Cervical back: Normal range of motion and neck supple.  Skin:    General: Skin is warm and dry.     Findings: No erythema.  Neurological:     Mental Status: She is alert and oriented to person, place, and time. Mental status is at baseline.     Motor: No abnormal muscle tone.  Psychiatric:        Mood and Affect: Mood and affect normal.    Breast exam was performed in seated and lying down position. Patient is status post left breast biopsy.  Palpable small left outer upper quadrant mass No palpable breast masses in right breast.  No palpable axillary adenopathy bilaterally.   LABORATORY DATA:  I have reviewed the data  as listed    Latest Ref Rng & Units 08/22/2024   12:16 PM 03/21/2023    8:44 AM 06/04/2022    8:52 AM  CBC  WBC 4.0 - 10.5 K/uL 7.3  6.6  7.9   Hemoglobin 12.0 - 15.0 g/dL 87.1  87.5  87.1   Hematocrit 36.0 - 46.0 % 38.1  37.1  38.4   Platelets 150 - 400 K/uL 331  331  303       Latest Ref Rng & Units 08/22/2024   12:16 PM 03/21/2023    8:44 AM 06/11/2022    8:43 AM  CMP  Glucose 70 - 99 mg/dL 890  98  889   BUN 8 - 23 mg/dL 19  14  14    Creatinine 0.44 - 1.00 mg/dL 8.89  9.18  9.08   Sodium 135 - 145 mmol/L 141  143  142   Potassium 3.5 - 5.1 mmol/L 3.3  4.2  3.7   Chloride 98 - 111 mmol/L 100  104  100   CO2 22 - 32 mmol/L 28  24  25    Calcium  8.9 - 10.3 mg/dL 89.2  89.8  9.9   Total Protein 6.5 - 8.1 g/dL 7.9  6.8  7.2   Total Bilirubin 0.0 - 1.2 mg/dL 0.4  0.3  0.3   Alkaline Phos 38 - 126 U/L 77  70  70   AST 15 - 41 U/L 33  22  16   ALT 0 - 44 U/L 38  27  22      RADIOGRAPHIC STUDIES: I have personally reviewed the radiological images as listed and agreed with the findings in the report. MM 3D DIAGNOSTIC MAMMOGRAM UNILATERAL LEFT BREAST Result Date: 08/27/2024 CLINICAL DATA:  History of recently diagnosed LEFT breast cancer. Oncologist desires axillary  evaluation and dedicated diagnostic views of the LEFT breast. EXAM: DIGITAL DIAGNOSTIC UNILATERAL LEFT MAMMOGRAM WITH TOMOSYNTHESIS AND CAD; ULTRASOUND LEFT BREAST LIMITED TECHNIQUE: Left digital diagnostic mammography and breast tomosynthesis was performed. The images were evaluated with computer-aided detection. ; Targeted ultrasound examination of the left breast was performed. COMPARISON:  Previous exam(s). ACR Breast Density Category b: There are scattered areas of fibroglandular density. FINDINGS: Redemonstrated oval circumscribed mass in the upper-outer quadrant of the LEFT breast containing a ribbon biopsy marking clip measuring approximately 11 mm, correlating with biopsy-proven LEFT breast cancer. Post biopsy changes are noted in the surrounding breast. A stable LEFT breast mass is noted in the lower outer quadrant which has been present for many years and is considered benign. Normal-appearing LEFT axillary lymph nodes are visualized. Targeted LEFT breast ultrasound was performed. At the LEFT breast 2 o'clock position 16 cm from the nipple there is an oval, near anechoic mass with angular margins measuring 12 x 6 x 11 mm, corresponding with biopsy-proven LEFT breast cancer. Allowing for post biopsy changes, it is not significantly changed in size from prior exam. A biopsy clip is noted within the mass. Complete LEFT axillary ultrasound was performed without suspicious lymphadenopathy. Representative pictures were taken. IMPRESSION: 1.  No LEFT axillary lymphadenopathy seen. 2. No significant interval change in size of biopsy-proven LEFT breast cancer, today measuring 12 x 6 x 11 mm. RECOMMENDATION: Continued oncologic treatment plan for known biopsy-proven LEFT breast malignancy. I have discussed the findings and recommendations with the patient. If applicable, a reminder letter will be sent to the patient regarding the next appointment. BI-RADS CATEGORY  6: Known biopsy-proven malignancy. Electronically  Signed   By:  Norleen Croak M.D.   On: 08/27/2024 16:21   US  LIMITED ULTRASOUND INCLUDING AXILLA LEFT BREAST  Result Date: 08/27/2024 CLINICAL DATA:  History of recently diagnosed LEFT breast cancer. Oncologist desires axillary evaluation and dedicated diagnostic views of the LEFT breast. EXAM: DIGITAL DIAGNOSTIC UNILATERAL LEFT MAMMOGRAM WITH TOMOSYNTHESIS AND CAD; ULTRASOUND LEFT BREAST LIMITED TECHNIQUE: Left digital diagnostic mammography and breast tomosynthesis was performed. The images were evaluated with computer-aided detection. ; Targeted ultrasound examination of the left breast was performed. COMPARISON:  Previous exam(s). ACR Breast Density Category b: There are scattered areas of fibroglandular density. FINDINGS: Redemonstrated oval circumscribed mass in the upper-outer quadrant of the LEFT breast containing a ribbon biopsy marking clip measuring approximately 11 mm, correlating with biopsy-proven LEFT breast cancer. Post biopsy changes are noted in the surrounding breast. A stable LEFT breast mass is noted in the lower outer quadrant which has been present for many years and is considered benign. Normal-appearing LEFT axillary lymph nodes are visualized. Targeted LEFT breast ultrasound was performed. At the LEFT breast 2 o'clock position 16 cm from the nipple there is an oval, near anechoic mass with angular margins measuring 12 x 6 x 11 mm, corresponding with biopsy-proven LEFT breast cancer. Allowing for post biopsy changes, it is not significantly changed in size from prior exam. A biopsy clip is noted within the mass. Complete LEFT axillary ultrasound was performed without suspicious lymphadenopathy. Representative pictures were taken. IMPRESSION: 1.  No LEFT axillary lymphadenopathy seen. 2. No significant interval change in size of biopsy-proven LEFT breast cancer, today measuring 12 x 6 x 11 mm. RECOMMENDATION: Continued oncologic treatment plan for known biopsy-proven LEFT breast malignancy. I  have discussed the findings and recommendations with the patient. If applicable, a reminder letter will be sent to the patient regarding the next appointment. BI-RADS CATEGORY  6: Known biopsy-proven malignancy. Electronically Signed   By: Norleen Croak M.D.   On: 08/27/2024 16:21   US  LT BREAST BX W LOC DEV 1ST LESION IMG BX SPEC US  GUIDE Addendum Date: 08/22/2024 ADDENDUM REPORT: 08/22/2024 09:39 ADDENDUM: The patient's axillary lymph nodes were not evaluated at the time of diagnostic imaging. If sonographic evaluation of the LEFT axilla is clinically desired, that exam should be ordered separately. This was discussed with nurse navigator Shasta Ada via telephone. Electronically Signed   By: Norleen Croak M.D.   On: 08/22/2024 09:39   Addendum Date: 08/21/2024 ADDENDUM REPORT: 08/21/2024 07:46 ADDENDUM: PATHOLOGY revealed: 1. Breast, left, needle core biopsy, 2 o'clock, 16 cmfn, 11 mm, ribbon clip- INVASIVE DUCTAL CARCINOMA, OVERALL GRADE: 3- LYMPHOVASCULAR INVASION: NOT IDENTIFIED- CANCER LENGTH: 1.1 CM- CALCIFICATIONS: NOT IDENTIFIED Pathology results are CONCORDANT with imaging findings, per Dr. Norleen Croak. Pathology results and recommendations below were discussed with patient by telephone. Patient reported biopsy site doing well with no adverse symptoms, and only slight tenderness at the site. Post biopsy care instructions were reviewed, questions were answered and my direct phone number was provided. Patient was instructed to call [facility] for any additional questions or concerns related to biopsy site. RECOMMENDATIONS: 1. Surgical and oncological consultation. Request for surgical and oncological consultation was relayed to Shasta Ada, Nurse Navigator at [facility]. Pathology results reported by Mliss CHARM Molt RN 08/17/2024. Electronically Signed   By: Norleen Croak M.D.   On: 08/21/2024 07:46   Result Date: 08/22/2024 CLINICAL DATA:  Indeterminate LEFT breast cyst  versus mass EXAM: ULTRASOUND GUIDED LEFT BREAST CORE NEEDLE BIOPSY COMPARISON:  Previous exam(s).  PROCEDURE: I met with the patient and we discussed the procedure of ultrasound-guided aspiration and biopsy, including benefits and alternatives. We discussed the high likelihood of a successful procedure. We discussed the risks of the procedure, including infection, bleeding, tissue injury, clip migration, and inadequate sampling. Informed written consent was given. The usual time-out protocol was performed immediately prior to the procedure. Lesion quadrant: Upper-outer Well numbing, an attempt was made to aspirate the LEFT breast mass. When the abnormality fail to aspirate, we proceeded to biopsy as below. Using sterile technique and 1% Lidocaine  as local anesthetic, under direct ultrasound visualization, a 14 gauge spring-loaded device was used to perform biopsy of the LEFT breast mass at 2 o'clock 16 cm from the nipple using a anti radial approach. At the conclusion of the procedure ribbon shaped tissue marker clip was deployed into the biopsy cavity. Follow up 2 view mammogram was performed and dictated separately. IMPRESSION: Ultrasound guided biopsy of the LEFT breast mass at 2 o'clock 16 cm from the nipple. No apparent complications. Electronically Signed: By: Norleen Croak M.D. On: 08/14/2024 09:13   MM CLIP PLACEMENT LEFT Result Date: 08/14/2024 CLINICAL DATA:  Status post LEFT breast biopsy EXAM: 3D DIAGNOSTIC LEFT MAMMOGRAM POST ULTRASOUND BIOPSY COMPARISON:  Previous exam(s). ACR Breast Density Category b: There are scattered areas of fibroglandular density. FINDINGS: 3D Mammographic images were obtained following ultrasound guided biopsy of the LEFT breast mass at 2 o'clock 16 cm from the nipple. The biopsy marking clip is in expected position at the site of biopsy. IMPRESSION: Appropriate positioning of the ribbon shaped biopsy marking clip at the site of biopsy in the LEFT breast. Final Assessment:  Post Procedure Mammograms for Marker Placement Electronically Signed   By: Norleen Croak M.D.   On: 08/14/2024 09:08   CT HEMATURIA WORKUP Result Date: 08/10/2024 CLINICAL DATA:  History of bladder cancer.  * Tracking Code: BO * EXAM: CT ABDOMEN AND PELVIS WITHOUT AND WITH CONTRAST TECHNIQUE: Multidetector CT imaging of the abdomen and pelvis was performed following the standard protocol before and following the bolus administration of intravenous contrast. RADIATION DOSE REDUCTION: This exam was performed according to the departmental dose-optimization program which includes automated exposure control, adjustment of the mA and/or kV according to patient size and/or use of iterative reconstruction technique. CONTRAST:  OMNIPAQUE  IOHEXOL  300 MG/ML  SOLN COMPARISON:  CT scan abdomen pelvis from 08/16/2023. FINDINGS: Lower chest: There are patchy atelectatic changes in the visualized lung bases. No overt consolidation. No pleural effusion. The heart is normal in size. No pericardial effusion. Hepatobiliary: The liver is normal in size. Non-cirrhotic configuration. No suspicious mass. These is mild diffuse hepatic steatosis. No intrahepatic or extrahepatic bile duct dilation. No calcified gallstones. Normal gallbladder wall thickness. No pericholecystic inflammatory changes. Pancreas: Unremarkable. No pancreatic ductal dilatation or surrounding inflammatory changes. Spleen: Within normal limits. No focal lesion. Adrenals/Urinary Tract: Adrenal glands are unremarkable. No suspicious renal mass. There is a subcentimeter sized simple cyst in the right kidney upper pole, anteriorly. No hydronephrosis. no renal or ureteric calculi. Unremarkable urinary bladder. No focal mass. No perivesical fat stranding or bladder calculi. Stomach/Bowel: No disproportionate dilation of the small or large bowel loops. No evidence of abnormal bowel wall thickening or inflammatory changes. The appendix is unremarkable. There are  multiple diverticula throughout the colon, without imaging signs of diverticulitis. Vascular/Lymphatic: No ascites or pneumoperitoneum. No abdominal or pelvic lymphadenopathy, by size criteria. No aneurysmal dilation of the major abdominal arteries. There are mild peripheral atherosclerotic  vascular calcifications of the aorta and its major branches. Reproductive: The uterus is surgically absent. No large adnexal mass. Other: There is a tiny fat containing umbilical hernia. The soft tissues and abdominal wall are otherwise unremarkable. Musculoskeletal: No suspicious osseous lesions. There are mild - moderate multilevel degenerative changes in the visualized spine. IMPRESSION: 1. No metastatic bladder cancer seen within the abdomen or pelvis. 2. Multiple other nonacute observations, as described above. Aortic Atherosclerosis (ICD10-I70.0). Electronically Signed   By: Ree Molt M.D.   On: 08/10/2024 16:17   US  LIMITED ULTRASOUND INCLUDING AXILLA LEFT BREAST  Result Date: 08/08/2024 CLINICAL DATA:  Screening recall LEFT breast mass, ultrasound only recommended EXAM: ULTRASOUND OF THE LEFT BREAST COMPARISON:  Prior examinations FINDINGS: On physical exam, a small palpable nodule is noted in the upper-outer quadrant of the LEFT breast at the 2 o'clock position 16 cm from the nipple Targeted ultrasound is performed, showing an oval, near anechoic mass measuring 11 x 8 x 10 mm which is mostly circumscribed but has partially indistinct margins. There is mild surrounding hypervascularity. This corresponds with the mammographic finding. IMPRESSION: Near anechoic LEFT breast cyst versus mass, corresponding with the abnormality described at the time of screening. Ultrasound-guided aspiration with possible conversion to biopsy is recommended. RECOMMENDATION: Ultrasound-guided cyst aspiration with possible conversion to biopsy of the LEFT breast x1 I have discussed the findings and recommendations with the patient. The  recommended procedure was discussed with the patient and questions were answered. Patient expressed their understanding of the recommendation. Patient will be scheduled for the procedure at her earliest convenience by the schedulers. Ordering provider will be notified. If applicable, a reminder letter will be sent to the patient regarding the next appointment. BI-RADS CATEGORY  4: Suspicious. Electronically Signed   By: Norleen Croak M.D.   On: 08/08/2024 13:28    "

## 2024-08-29 NOTE — Assessment & Plan Note (Signed)
 Persistent dry cough. Check CT chest images.

## 2024-08-29 NOTE — Assessment & Plan Note (Signed)
 Possibly due to dehydration or hydrochlorothiazide . Plan to check PTH, PTHrp in the future

## 2024-08-29 NOTE — Telephone Encounter (Signed)
 Patient has been advised of Pre-Admission date/time, and Surgery date at [facility].  Surgery Date: 09/05/24 Preadmission Testing Date: Preadmissions to call patient.    Patient informed of the scheduling process and surgery information given at time of office visit.   Patient has been made aware to call [phone], between 1-3:00pm the day before surgery, to find out what time to arrive for surgery.

## 2024-08-30 ENCOUNTER — Other Ambulatory Visit: Payer: Self-pay

## 2024-08-30 ENCOUNTER — Inpatient Hospital Stay

## 2024-08-30 ENCOUNTER — Other Ambulatory Visit: Payer: Self-pay | Admitting: *Deleted

## 2024-08-30 DIAGNOSIS — C50912 Malignant neoplasm of unspecified site of left female breast: Secondary | ICD-10-CM

## 2024-08-30 DIAGNOSIS — Z23 Encounter for immunization: Secondary | ICD-10-CM

## 2024-08-30 DIAGNOSIS — Z5112 Encounter for antineoplastic immunotherapy: Secondary | ICD-10-CM | POA: Diagnosis not present

## 2024-08-30 MED ORDER — INFLUENZA VIRUS VACC SPLIT PF (FLUZONE) 0.5 ML IM SUSY
0.5000 mL | PREFILLED_SYRINGE | Freq: Once | INTRAMUSCULAR | Status: AC
  Administered 2024-08-30: 0.5 mL via INTRAMUSCULAR
  Filled 2024-08-30: qty 0.5

## 2024-08-31 ENCOUNTER — Encounter: Payer: Self-pay | Admitting: Surgery

## 2024-08-31 ENCOUNTER — Other Ambulatory Visit: Payer: Self-pay

## 2024-08-31 ENCOUNTER — Ambulatory Visit: Payer: Self-pay | Admitting: Surgery

## 2024-08-31 ENCOUNTER — Encounter
Admission: RE | Admit: 2024-08-31 | Discharge: 2024-08-31 | Disposition: A | Source: Ambulatory Visit | Attending: Surgery

## 2024-08-31 ENCOUNTER — Inpatient Hospital Stay: Admitting: Licensed Clinical Social Worker

## 2024-08-31 VITALS — BP 112/74 | HR 59 | Temp 97.8°F | Ht 67.0 in | Wt 220.8 lb

## 2024-08-31 DIAGNOSIS — C50919 Malignant neoplasm of unspecified site of unspecified female breast: Secondary | ICD-10-CM

## 2024-08-31 DIAGNOSIS — C50412 Malignant neoplasm of upper-outer quadrant of left female breast: Secondary | ICD-10-CM

## 2024-08-31 DIAGNOSIS — Z01810 Encounter for preprocedural cardiovascular examination: Secondary | ICD-10-CM

## 2024-08-31 DIAGNOSIS — E876 Hypokalemia: Secondary | ICD-10-CM

## 2024-08-31 DIAGNOSIS — Z79899 Other long term (current) drug therapy: Secondary | ICD-10-CM

## 2024-08-31 DIAGNOSIS — Z01812 Encounter for preprocedural laboratory examination: Secondary | ICD-10-CM

## 2024-08-31 DIAGNOSIS — C50912 Malignant neoplasm of unspecified site of left female breast: Secondary | ICD-10-CM

## 2024-08-31 DIAGNOSIS — I10 Essential (primary) hypertension: Secondary | ICD-10-CM

## 2024-08-31 HISTORY — DX: Prediabetes: R73.03

## 2024-08-31 HISTORY — DX: Obesity, class 1: E66.811

## 2024-08-31 HISTORY — DX: Alcohol abuse, in remission: F10.11

## 2024-08-31 HISTORY — DX: Polyp of colon: K63.5

## 2024-08-31 HISTORY — DX: Malignant neoplasm of unspecified site of unspecified female breast: C50.919

## 2024-08-31 HISTORY — DX: Personal history of malignant neoplasm of bladder: Z85.51

## 2024-08-31 NOTE — Patient Instructions (Addendum)
 Your procedure is scheduled on:09-06-23 Wednesday Report to the Registration Desk on the 1st floor of the Medical Mall.Then proceed to the 2nd floor Surgery Desk  To find out your arrival time, please call [phone] between 1PM - 3PM on:09-04-24 Tuesday If your arrival time is 6:00 am, do not arrive before that time as the Medical Mall entrance doors do not open until 6:00 am.  REMEMBER: Instructions that are not followed completely may result in serious medical risk, up to and including death; or upon the discretion of your surgeon and anesthesiologist your surgery may need to be rescheduled.  Do not eat food after midnight the night before surgery.  No gum chewing or hard candies.  You may however, drink CLEAR liquids up to 2 hours before you are scheduled to arrive for your surgery. Do not drink anything within 2 hours of your scheduled arrival time.  Clear liquids include: - water   - apple juice without pulp - gatorade (not RED colors) - black coffee or tea (Do NOT add milk or creamers to the coffee or tea) Do NOT drink anything that is not on this list.  One week prior to surgery:Stop NOW (08-31-24) Stop Anti-inflammatories (NSAIDS) such as Advil , Aleve , Ibuprofen , Motrin , Naproxen , Naprosyn  and Aspirin based products such as Excedrin, Goody's Powder, BC Powder. Stop ANY OVER THE COUNTER supplements until after surgery (Calcium )  You may however, continue to take Tylenol  if needed for pain up until the day of surgery  Continue taking all of your other prescription medications up until the day of surgery.  ON THE DAY OF SURGERY ONLY TAKE THESE MEDICATIONS WITH SIPS OF WATER : -amLODipine  (NORVASC )   No Alcohol for 24 hours before or after surgery.  No Smoking including e-cigarettes for 24 hours before surgery.  No chewable tobacco products for at least 6 hours before surgery.  No nicotine patches on the day of surgery.  Do not use any recreational drugs for at least a week  (preferably 2 weeks) before your surgery.  Please be advised that the combination of cocaine and anesthesia may have negative outcomes, up to and including death. If you test positive for cocaine, your surgery will be cancelled.  On the morning of surgery brush your teeth with toothpaste and water , you may rinse your mouth with mouthwash if you wish. Do not swallow any toothpaste or mouthwash  Use CHG Soap as directed on instruction sheet.  Do not wear jewelry, make-up, hairpins, clips or nail polish.  For welded (permanent) jewelry: bracelets, anklets, waist bands, etc.  Please have this removed prior to surgery.  If it is not removed, there is a chance that hospital personnel will need to cut it off on the day of surgery.  Do not wear lotions, powders, or perfumes.   Do not shave body hair from the neck down 48 hours before surgery.  Contact lenses, hearing aids and dentures may not be worn into surgery.  Do not bring valuables to the hospital. [facility] is not responsible for any missing/lost belongings or valuables.   Notify your doctor if there is any change in your medical condition (cold, fever, infection).  Wear comfortable clothing (specific to your surgery type) to the hospital.  After surgery, you can help prevent lung complications by doing breathing exercises.  Take deep breaths and cough every 1-2 hours. Your doctor may order a device called an Incentive Spirometer to help you take deep breaths. When coughing or sneezing, hold a pillow firmly against  your incision with both hands. This is called splinting. Doing this helps protect your incision. It also decreases belly discomfort.  If you are being admitted to the hospital overnight, leave your suitcase in the car. After surgery it may be brought to your room.  In case of increased patient census, it may be necessary for you, the patient, to continue your postoperative care in the Same Day Surgery department.  If  you are being discharged the day of surgery, you will not be allowed to drive home. You will need a responsible individual to drive you home and stay with you for 24 hours after surgery.   If you are taking public transportation, you will need to have a responsible individual with you.  Please call the Pre-admissions Testing Dept. at [phone] if you have any questions about these instructions.  Surgery Visitation Policy:  Patients having surgery or a procedure may have two visitors.  Children under the age of 48 must have an adult with them who is not the patient.                                                                                                             Preparing for Surgery with CHLORHEXIDINE  GLUCONATE (CHG) Soap  Chlorhexidine  Gluconate (CHG) Soap  o An antiseptic cleaner that kills germs and bonds with the skin to continue killing germs even after washing  o Used for showering the night before surgery and morning of surgery  Before surgery, you can play an important role by reducing the number of germs on your skin.  CHG (Chlorhexidine  gluconate) soap is an antiseptic cleanser which kills germs and bonds with the skin to continue killing germs even after washing.  Please do not use if you have an allergy to CHG or antibacterial soaps. If your skin becomes reddened/irritated stop using the CHG.  1. Shower the NIGHT BEFORE SURGERY with CHG soap.  2. If you choose to wash your hair, wash your hair first as usual with your normal shampoo.  3. After shampooing, rinse your hair and body thoroughly to remove the shampoo.  4. Use CHG as you would any other liquid soap. You can apply CHG directly to the skin and wash gently with a clean washcloth.  5. Apply the CHG soap to your body only from the neck down. Do not use on open wounds or open sores. Avoid contact with your eyes, ears, mouth, and genitals (private parts). Wash face and genitals (private parts) with  your normal soap.  6. Wash thoroughly, paying special attention to the area where your surgery will be performed.  7. Thoroughly rinse your body with warm water .  8. Do not shower/wash with your normal soap after using and rinsing off the CHG soap.  9. Do not use lotions, oils, etc., after showering with CHG.  10. Pat yourself dry with a clean towel.  11. Wear clean pajamas to bed the night before surgery.  12. Place clean sheets on your bed the night of your shower and  do not sleep with pets.  13. Do not apply any deodorants/lotions/powders.  14. Please wear clean clothes to the hospital.  15. Remember to brush your teeth with your regular toothpaste.   [employer]ch[employer], Retail to address health-related social needs:  [email or web address]

## 2024-08-31 NOTE — Patient Instructions (Signed)
 We have seen you today and have spoken about your port placement. This will be scheduled at [facility] with Dr. Desiderio.  If you are on any injectable weight loss medication, you will need to stop taking your GLP-1 injectable (weight loss) medications 8 days before your surgery to avoid any complications with anesthesia.   Please see the [facility]) Sheet provided for further details. Our surgery scheduler will call you to look at surgery dates and go over surgery information.   Please call our office with any questions or concerns that you have.   [facility]) A central line is a soft, flexible tube (catheter) that can be used to collect blood for testing or to give medicine or nutrition through a vein. The tip of the central line ends in a large vein just above the heart called the vena cava. A central line may be placed because: You need to get medicines or fluids through an IV tube for a long period of time. You need nutrition but cannot eat or absorb nutrients. The veins in your hands or arms are hard to access. You need to have blood taken often for blood tests. You need a blood transfusion You need chemotherapy or dialysis.  There are many types of central lines: Peripherally inserted central catheter (PICC) line. This type is used for intermediate access to long-term access of one week or more. It can be used to draw blood and give fluids or medicines. A PICC looks like an IV tube, but it goes up the arm to the heart. It is usually inserted in the upper arm and taped in place on the arm. Tunneled central line. This type is used for long-term therapy and dialysis. It is placed in a large vein in the neck, chest, or groin. A tunneled central line is inserted through a small incision made over the vein and is advanced into the heart. It is tunneled beneath the skin and brought out through a second incision. Non-tunneled central line. This type is used for short-term access,  usually of a maximum of 7 days. It is often used in the emergency department. A non-tunneled central line is inserted in the neck, chest, or groin. Implanted port. This type is used for long-term therapy. It can stay in place longer than other types of central lines. An implanted port is normally inserted in the upper chest but can also be placed in the upper arm or in the abdomen. It is inserted and removed with surgery, and it is accessed using a special needle.  The type of central line that you receive depends on how long you will need it, your medical condition, and the condition of your veins. What are the risks? Using any type of central line has risks that you should be aware of, including: Infection. A blood clot that blocks the central line or forms in the vein and travels to the heart. Bleeding from the place where the central line was put in. Developing a hole or crack within the central line. If this happens, the central line will need to be replaced. Developing an abnormal heart rhythm (arrhythmia). This is rare. Central line failure.  Follow these instructions at home: Flushing and cleaning the central line Follow instructions from the health care provider about flushing and cleaning the central line. Wear a mask when flushing or cleaning the central line. Before you flush or clean the central line: Wash your hands with soap and water . Clean the central line  hub with rubbing alcohol. Insertion site care Keep the insertion site of your central line clean and dry at all times. Check your incision or central line site every day for signs of infection. Check for: More redness, swelling, or pain. More fluid or blood. Warmth. Pus or a bad smell. General instructions Follow instructions from your health care provider for the type of device that you have. If the central line accidentally gets pulled on, make sure: The bandage (dressing) is okay. There is no bleeding. The line  has not been pulled out. Return to your normal activities as told by your health care provider. Ask your health care provider what activities are safe for you. You may be restricted from lifting or making repetitive arm movements on the side with the catheter. Do not swim or bathe unless your health care provider approves. Keep your dressing dry. Your health care provider can instruct you about how to keep your specific type of dressing from getting wet. Keep all follow-up visits as told by your health care provider. This is important. Contact a health care provider if: You have more redness, swelling, or pain around your incision. You have more fluid or blood coming from your incision. Your incision feels warm to the touch. You have pus or a bad smell coming from your incision. Get help right away if: You have: Chills. A fever. Shortness of breath. Trouble breathing. Chest pain. Swelling in your neck, face, chest, or arm on the side of your central line. You are coughing. You feel your heart beating rapidly or skipping beats. You feel dizzy or you faint. Your incision or central line site has red streaks spreading away from the area. Your incision or central line site is bleeding and does not stop. Your central line is difficult to flush or will not flush. You do not get a blood return from the central line. Your central line gets loose or comes out. Your central line gets damaged. Your catheter leaks when flushed or when fluids are infused into it. This information is not intended to replace advice given to you by your health care provider. Make sure you discuss any questions you have with your health care provider. Document Released: 09/30/2005 Document Revised: 04/07/2016 Document Reviewed: 03/17/2016 Elsevier Interactive Patient Education  2017 [employer].

## 2024-08-31 NOTE — Progress Notes (Signed)
 " 08/31/2024  Reason for Visit: Left breast invasive ductal carcinoma, triple negative  Requesting Provider: Jon Eva, MD  History of Present Illness: Christina Holloway is a 63 y.o. female presenting for evaluation of newly diagnosed left breast cancer.  The patient had mammogram and ultrasound which showed a 12 x 6 x 11 mm mass in the left breast at 2 o'clock position, 16 cm from the nipple.  There was no axillary lymphadenopathy.  Biopsy of this showed invasive ductal carcinoma but ER/PR/HER2 negative.  She has met with Dr. Babara with oncology and neoadjuvant chemotherapy has been recommended.  As such, she presents today for evaluation for Port-A-Cath placement.  The patient has a history of bladder cancer and she completed immunotherapy for this.  She has a history of hysterectomy 2009, there is history of breast cancer in her family including grandmother and some cousins.  First age of menstrual cycle was 89, has not been pregnant.  Past Medical History: Past Medical History:  Diagnosis Date   Abnormal LFTs 05/02/2015   Anemia, iron deficiency 08/24/2003   in past   Arthritis    lower back, legs   Calcium  blood increased 05/02/2015   Cancer (HCC)    Colon polyp    Decreased potassium in the blood 05/02/2015   Essential (primary) hypertension 08/24/2003   Gout 05/02/2015   History of alcohol abuse    History of primary malignant neoplasm of urinary bladder    Hypercholesterolemia without hypertriglyceridemia 04/29/2009   Invasive ductal carcinoma of breast (HCC)    Malaise and fatigue 05/02/2015   Obesity (BMI 30.0-34.9)    Pre-diabetes    Sciatica    Shortness of breath dyspnea    1 flight stairs   Wears dentures    full upper, partial lower     Past Surgical History: Past Surgical History:  Procedure Laterality Date   BREAST BIOPSY Left 08/14/2024   US  LT BREAST BX W LOC DEV 1ST LESION IMG BX SPEC US  GUIDE 08/14/2024 [facility]   COLONOSCOPY WITH PROPOFOL  N/A  05/08/2015   Procedure: COLONOSCOPY WITH PROPOFOL ;  Surgeon: Rogelia Copping, MD;  Location: [facility];  Service: Endoscopy;  Laterality: N/A;   COLONOSCOPY WITH PROPOFOL  N/A 05/30/2018   Procedure: COLONOSCOPY WITH PROPOFOL ;  Surgeon: Unk Corinn Skiff, MD;  Location: [facility];  Service: Gastroenterology;  Laterality: N/A;   COLONOSCOPY WITH PROPOFOL  N/A 07/08/2023   Procedure: COLONOSCOPY WITH PROPOFOL ;  Surgeon: Unk Corinn Skiff, MD;  Location: [facility];  Service: Gastroenterology;  Laterality: N/A;   CYSTOSCOPY WITH FULGERATION  09/14/2019   Procedure: CYSTOSCOPY WITH FULGERATION;  Surgeon: Francisca Redell BROCKS, MD;  Location: [facility];  Service: Urology;;   ENDOMETRIAL ABLATION     POLYPECTOMY  05/08/2015   Procedure: POLYPECTOMY;  Surgeon: Rogelia Copping, MD;  Location: [facility];  Service: Endoscopy;;   POLYPECTOMY  07/08/2023   Procedure: POLYPECTOMY INTESTINAL;  Surgeon: Unk Corinn Skiff, MD;  Location: [facility] END[facility];  Service: Gastroenterology;;   TRANSURETHRAL RESECTION OF BLADDER TUMOR WITH MITOMYCIN -C N/A 08/10/2019   Procedure: TRANSURETHRAL RESECTION OF BLADDER TUMOR WITH Gemcitabine ;  Surgeon: Francisca Redell BROCKS, MD;  Location: [facility];  Service: Urology;  Laterality: N/A;   VAGINAL HYSTERECTOMY      Home Medications: Prior to Admission medications  Medication Sig Start Date End Date Taking? Authorizing Provider  amLODipine  (NORVASC ) 10 MG tablet Take 1 tablet (10 mg total) by mouth daily. Patient taking differently: Take 10 mg by mouth every morning. 12/12/23  Yes  Myrla Jon HERO, MD  CALCIUM  PO Take 1 tablet by mouth daily.   Yes [provider]  cetirizine  (ZYRTEC ) 10 MG tablet Take 1 tablet (10 mg total) by mouth daily. 11/09/21  Yes Bacigalupo, Jon HERO, MD  colchicine  0.6 MG tablet TAKE 2 TABLETS BY MOUTH AT FIRST SIGN OF GOUT FLARE. THEN TAKE 1 TABLET 1 HOURLATER. CONTINUE 1 TABLET TWICE A DAY UNTIL 48 HOURS AFTER RESOLUTION OF  FLARE 04/08/23  Yes Bacigalupo, Jon HERO, MD  cyclobenzaprine  (FLEXERIL ) 5 MG tablet Take 1 tablet (5 mg total) by mouth 3 (three) times daily as needed for muscle spasms. 04/14/23  Yes Bacigalupo, Jon HERO, MD  dexamethasone  (DECADRON ) 4 MG tablet Take 2 tablets daily for 2 days, start the day after chemotherapy. Take with food. 08/29/24  Yes Babara Call, MD  docusate sodium  (COLACE) 100 MG capsule Take 100 mg by mouth daily.   Yes [provider]  fluticasone  (FLONASE ) 50 MCG/ACT nasal spray Place 2 sprays into both nostrils daily. 11/09/21  Yes Bacigalupo, Jon HERO, MD  hydrochlorothiazide  (HYDRODIURIL ) 25 MG tablet Take 1 tablet (25 mg total) by mouth daily. 12/12/23  Yes Bacigalupo, Angela M, MD  HYDROcodone -acetaminophen  (NORCO/VICODIN) 5-325 MG tablet Take 1 tablet by mouth every 6 (six) hours as needed for moderate pain (pain score 4-6). 06/20/24  Yes Arlander Charleston, MD  lidocaine -prilocaine  (EMLA ) cream Apply to affected area once 08/29/24  Yes Babara Call, MD  losartan  (COZAAR ) 100 MG tablet Take 1 tablet (100 mg total) by mouth daily. 12/12/23  Yes Bacigalupo, Jon HERO, MD  naproxen  (NAPROSYN ) 500 MG tablet TAKE 1 TABLET BY MOUTH TWICE A DAY WITH MEALS 08/11/23  Yes Bacigalupo, Jon HERO, MD  ondansetron  (ZOFRAN ) 8 MG tablet Take 1 tablet (8 mg total) by mouth every 8 (eight) hours as needed for nausea or vomiting. Start on the third day after chemotherapy. 08/29/24  Yes Babara Call, MD  prochlorperazine  (COMPAZINE ) 10 MG tablet Take 1 tablet (10 mg total) by mouth every 6 (six) hours as needed for nausea or vomiting. 08/29/24  Yes Babara Call, MD  rosuvastatin  (CRESTOR ) 5 MG tablet Take 1 tablet (5 mg total) by mouth daily. 12/12/23  Yes Bacigalupo, Jon HERO, MD    Allergies: Allergies[1]  Social History:  reports that she has never smoked. She has never been exposed to tobacco smoke. She has never used smokeless tobacco. She reports current alcohol use of about 36.0 standard drinks of alcohol per week.  She reports that she does not use drugs.   Family History: Family History  Problem Relation Age of Onset   Hypertension Mother    Thyroid disease Mother    Prostate cancer Father 8   AAA (abdominal aortic aneurysm) Sister    Breast cancer Paternal Grandmother 92   Breast cancer Cousin 27   Brain cancer Cousin    Lung cancer Cousin     Review of Systems: Review of Systems  Constitutional:  Negative for chills and fever.  Respiratory:  Negative for shortness of breath.   Cardiovascular:  Negative for chest pain.  Gastrointestinal:  Negative for abdominal pain, nausea and vomiting.    Physical Exam BP 112/74   Pulse (!) 59   Temp 97.8 F (36.6 C) (Oral)   Ht 5' 7 (1.702 m)   Wt 220 lb 12.8 oz (100.2 kg)   SpO2 97%   BMI 34.58 kg/m  CONSTITUTIONAL: No acute distress HEENT:  Normocephalic, atraumatic, extraocular motion intact. NECK: Trachea is midline, and  there is no jugular venous distension.  RESPIRATORY:  Lungs are clear, and breath sounds are equal bilaterally. Normal respiratory effort without pathologic use of accessory muscles. CARDIOVASCULAR: Heart is regular without murmurs, gallops, or rubs. BREAST: Left breast without palpable mass at the 12 o'clock position, 16 cm from the nipple as shown on prior imaging.  Palpable areas likely from inflammatory changes from the biopsy itself.  Otherwise no other palpable masses, skin changes, or nipple changes.  No left axillary lymphadenopathy.  Right breast without any palpable masses, skin changes, or nipple changes.  No right axillary lymphadenopathy. MUSCULOSKELETAL:  Normal muscle strength and tone in all four extremities.  No peripheral edema or cyanosis. SKIN: Skin turgor is normal. There are no pathologic skin lesions.  NEUROLOGIC:  Motor and sensation is grossly normal.  Cranial nerves are grossly intact. PSYCH:  Alert and oriented to person, place and time. Affect is normal.  Laboratory Analysis: Labs from  08/22/2024: Sodium 141, potassium 3.3, chloride 100, CO2 28, BUN 19, creatinine 1.1.  LFTs within normal limits.  WBC 7.3, hemoglobin 12.8, hematocrit 30.1, platelets 331.  Imaging: Mammogram and ultrasound on 08/27/2024: FINDINGS: Redemonstrated oval circumscribed mass in the upper-outer quadrant of the LEFT breast containing a ribbon biopsy marking clip measuring approximately 11 mm, correlating with biopsy-proven LEFT breast cancer. Post biopsy changes are noted in the surrounding breast. A stable LEFT breast mass is noted in the lower outer quadrant which has been present for many years and is considered benign. Normal-appearing LEFT axillary lymph nodes are visualized.   Targeted LEFT breast ultrasound was performed. At the LEFT breast 2 o'clock position 16 cm from the nipple there is an oval, near anechoic mass with angular margins measuring 12 x 6 x 11 mm, corresponding with biopsy-proven LEFT breast cancer. Allowing for post biopsy changes, it is not significantly changed in size from prior exam. A biopsy clip is noted within the mass.   Complete LEFT axillary ultrasound was performed without suspicious lymphadenopathy. Representative pictures were taken.   IMPRESSION: 1.  No LEFT axillary lymphadenopathy seen.   2. No significant interval change in size of biopsy-proven LEFT breast cancer, today measuring 12 x 6 x 11 mm.   RECOMMENDATION: Continued oncologic treatment plan for known  biopsy-proven LEFT breast malignancy.   I have discussed the findings and recommendations with the patient. If applicable, a reminder letter will be sent to the patient regarding the next appointment.   BI-RADS CATEGORY  6: Known biopsy-proven malignancy.  Assessment and Plan: This is a 64 y.o. female with newly diagnosed triple negative left breast cancer.  - Discussed with the patient the findings on her imaging studies and biopsy results.  Overall given the nature of her breast cancer, neoadjuvant  chemotherapy has been recommended.  As such, she requires Port-A-Cath placement.  Discussed with patient what her Port-A-Cath is and how it works and why it is needed for chemotherapy.  She is in agreement with placement of this. - Discussed with patient and the plan for a right sided Port-A-Cath placement and reviewed the surgery at length with her including the planned incision, risks of bleeding, infection, injury to surrounding structures, pneumothorax, that this would be an outpatient procedure, postoperative activity restrictions, pain control, and she is willing to proceed. - Patient is scheduled for surgery on 09/05/2024.  All of her questions have been answered.  I spent 40 minutes dedicated to the care of this patient on the date of this encounter to include pre-visit  review of records, face-to-face time with the patient discussing diagnosis and management, and any post-visit coordination of care.   Aloysius Sheree Plant, MD [facility]       [1]  Allergies Allergen Reactions   Penicillins Rash    Did it involve swelling of the face/tongue/throat, SOB, or low BP? No Did it involve sudden or severe rash/hives, skin peeling, or any reaction on the inside of your mouth or nose? No Did you need to seek medical attention at a hospital or doctor's office? No When did it last happen?     childhood allergy  If all above answers are NO, may proceed with cephalosporin use.    "

## 2024-08-31 NOTE — Progress Notes (Signed)
 [facility] Clinical Social Work  Initial Assessment   Christina Holloway is a 64 y.o. year old female contacted by phone. Clinical Social Work was referred by nurse navigator for assessment of psychosocial needs.   SDOH (Social Determinants of Health) assessments performed: Yes- reviewed from 12/31   SDOH Screenings   Food Insecurity: No Food Insecurity (08/22/2024)  Housing: Low Risk (08/22/2024)  Transportation Needs: No Transportation Needs (08/22/2024)  Utilities: Not At Risk (08/22/2024)  Alcohol Screen: Low Risk (06/03/2022)  Depression (PHQ2-9): Low Risk (08/22/2024)  Tobacco Use: Low Risk (08/31/2024)    PHQ 2/9:    08/22/2024   11:04 AM 06/06/2023    9:24 AM 03/21/2023    8:13 AM  Depression screen PHQ 2/9  Decreased Interest 0 0 0  Down, Depressed, Hopeless 0 0 0  PHQ - 2 Score 0 0 0     Distress Screen completed: Yes- reviewed from 1/8    08/30/2024    3:31 PM  ONCBCN DISTRESS SCREENING  Screening Type Initial Screening  How much distress have you been experiencing in the past week? (0-10) 3      Family/Social Information:  Housing Arrangement: patient lives with her spouse, Jon Family members/support persons in your life? Family and Friends Transportation concerns: no, has many family and friends willing to help if needed  Employment: Working full time - is getting paperwork completed for [employer] and short-term disability.  Income source: Employment and spouse's employment Financial concerns: No Type of concern: None Food access concerns: no Religious or spiritual practice: Not known Services Currently in place:  [employer]; working on [employer]  Coping/ Adjustment to diagnosis: Patient understands treatment plan and what happens next? yes, she will be starting chemotherapy. She has had bladder cancer before so has some familiarity with cancer treatment but plans to see how she reacts to chemo Patient reported stressors: Adjusting to my illness Current coping skills/  strengths: Manufacturing systems engineer , Motivation for treatment/growth , and Supportive family/friends     SUMMARY: Current SDOH Barriers:  No major barriers identified today  Clinical Social Work Clinical Goal(s):  No clinical social work goals at this time  Interventions: Discussed common feeling and emotions when being diagnosed with cancer, and the importance of support during treatment Informed patient of the support team roles and support services at [facility] Provided CSW contact information and encouraged patient to call with any questions or concerns   Follow Up Plan: Patient will contact CSW with any support or resource needs Patient verbalizes understanding of plan: Yes    Marlow Hendrie E Macayla Ekdahl, LCSW Clinical Social Worker [facility]

## 2024-08-31 NOTE — H&P (View-Only) (Signed)
 " 08/31/2024  Reason for Visit: Left breast invasive ductal carcinoma, triple negative  Requesting Provider: Jon Eva, MD  History of Present Illness: Christina Holloway is a 63 y.o. female presenting for evaluation of newly diagnosed left breast cancer.  The patient had mammogram and ultrasound which showed a 12 x 6 x 11 mm mass in the left breast at 2 o'clock position, 16 cm from the nipple.  There was no axillary lymphadenopathy.  Biopsy of this showed invasive ductal carcinoma but ER/PR/HER2 negative.  She has met with Dr. Babara with oncology and neoadjuvant chemotherapy has been recommended.  As such, she presents today for evaluation for Port-A-Cath placement.  The patient has a history of bladder cancer and she completed immunotherapy for this.  She has a history of hysterectomy 2009, there is history of breast cancer in her family including grandmother and some cousins.  First age of menstrual cycle was 89, has not been pregnant.  Past Medical History: Past Medical History:  Diagnosis Date   Abnormal LFTs 05/02/2015   Anemia, iron deficiency 08/24/2003   in past   Arthritis    lower back, legs   Calcium  blood increased 05/02/2015   Cancer (HCC)    Colon polyp    Decreased potassium in the blood 05/02/2015   Essential (primary) hypertension 08/24/2003   Gout 05/02/2015   History of alcohol abuse    History of primary malignant neoplasm of urinary bladder    Hypercholesterolemia without hypertriglyceridemia 04/29/2009   Invasive ductal carcinoma of breast (HCC)    Malaise and fatigue 05/02/2015   Obesity (BMI 30.0-34.9)    Pre-diabetes    Sciatica    Shortness of breath dyspnea    1 flight stairs   Wears dentures    full upper, partial lower     Past Surgical History: Past Surgical History:  Procedure Laterality Date   BREAST BIOPSY Left 08/14/2024   US  LT BREAST BX W LOC DEV 1ST LESION IMG BX SPEC US  GUIDE 08/14/2024 [facility]   COLONOSCOPY WITH PROPOFOL  N/A  05/08/2015   Procedure: COLONOSCOPY WITH PROPOFOL ;  Surgeon: Rogelia Copping, MD;  Location: [facility];  Service: Endoscopy;  Laterality: N/A;   COLONOSCOPY WITH PROPOFOL  N/A 05/30/2018   Procedure: COLONOSCOPY WITH PROPOFOL ;  Surgeon: Unk Corinn Skiff, MD;  Location: [facility];  Service: Gastroenterology;  Laterality: N/A;   COLONOSCOPY WITH PROPOFOL  N/A 07/08/2023   Procedure: COLONOSCOPY WITH PROPOFOL ;  Surgeon: Unk Corinn Skiff, MD;  Location: [facility];  Service: Gastroenterology;  Laterality: N/A;   CYSTOSCOPY WITH FULGERATION  09/14/2019   Procedure: CYSTOSCOPY WITH FULGERATION;  Surgeon: Francisca Redell BROCKS, MD;  Location: [facility];  Service: Urology;;   ENDOMETRIAL ABLATION     POLYPECTOMY  05/08/2015   Procedure: POLYPECTOMY;  Surgeon: Rogelia Copping, MD;  Location: [facility];  Service: Endoscopy;;   POLYPECTOMY  07/08/2023   Procedure: POLYPECTOMY INTESTINAL;  Surgeon: Unk Corinn Skiff, MD;  Location: [facility] END[facility];  Service: Gastroenterology;;   TRANSURETHRAL RESECTION OF BLADDER TUMOR WITH MITOMYCIN -C N/A 08/10/2019   Procedure: TRANSURETHRAL RESECTION OF BLADDER TUMOR WITH Gemcitabine ;  Surgeon: Francisca Redell BROCKS, MD;  Location: [facility];  Service: Urology;  Laterality: N/A;   VAGINAL HYSTERECTOMY      Home Medications: Prior to Admission medications  Medication Sig Start Date End Date Taking? Authorizing Provider  amLODipine  (NORVASC ) 10 MG tablet Take 1 tablet (10 mg total) by mouth daily. Patient taking differently: Take 10 mg by mouth every morning. 12/12/23  Yes  Myrla Jon HERO, MD  CALCIUM  PO Take 1 tablet by mouth daily.   Yes [provider]  cetirizine  (ZYRTEC ) 10 MG tablet Take 1 tablet (10 mg total) by mouth daily. 11/09/21  Yes Bacigalupo, Jon HERO, MD  colchicine  0.6 MG tablet TAKE 2 TABLETS BY MOUTH AT FIRST SIGN OF GOUT FLARE. THEN TAKE 1 TABLET 1 HOURLATER. CONTINUE 1 TABLET TWICE A DAY UNTIL 48 HOURS AFTER RESOLUTION OF  FLARE 04/08/23  Yes Bacigalupo, Jon HERO, MD  cyclobenzaprine  (FLEXERIL ) 5 MG tablet Take 1 tablet (5 mg total) by mouth 3 (three) times daily as needed for muscle spasms. 04/14/23  Yes Bacigalupo, Jon HERO, MD  dexamethasone  (DECADRON ) 4 MG tablet Take 2 tablets daily for 2 days, start the day after chemotherapy. Take with food. 08/29/24  Yes Babara Call, MD  docusate sodium  (COLACE) 100 MG capsule Take 100 mg by mouth daily.   Yes [provider]  fluticasone  (FLONASE ) 50 MCG/ACT nasal spray Place 2 sprays into both nostrils daily. 11/09/21  Yes Bacigalupo, Jon HERO, MD  hydrochlorothiazide  (HYDRODIURIL ) 25 MG tablet Take 1 tablet (25 mg total) by mouth daily. 12/12/23  Yes Bacigalupo, Angela M, MD  HYDROcodone -acetaminophen  (NORCO/VICODIN) 5-325 MG tablet Take 1 tablet by mouth every 6 (six) hours as needed for moderate pain (pain score 4-6). 06/20/24  Yes Arlander Charleston, MD  lidocaine -prilocaine  (EMLA ) cream Apply to affected area once 08/29/24  Yes Babara Call, MD  losartan  (COZAAR ) 100 MG tablet Take 1 tablet (100 mg total) by mouth daily. 12/12/23  Yes Bacigalupo, Jon HERO, MD  naproxen  (NAPROSYN ) 500 MG tablet TAKE 1 TABLET BY MOUTH TWICE A DAY WITH MEALS 08/11/23  Yes Bacigalupo, Jon HERO, MD  ondansetron  (ZOFRAN ) 8 MG tablet Take 1 tablet (8 mg total) by mouth every 8 (eight) hours as needed for nausea or vomiting. Start on the third day after chemotherapy. 08/29/24  Yes Babara Call, MD  prochlorperazine  (COMPAZINE ) 10 MG tablet Take 1 tablet (10 mg total) by mouth every 6 (six) hours as needed for nausea or vomiting. 08/29/24  Yes Babara Call, MD  rosuvastatin  (CRESTOR ) 5 MG tablet Take 1 tablet (5 mg total) by mouth daily. 12/12/23  Yes Bacigalupo, Jon HERO, MD    Allergies: Allergies[1]  Social History:  reports that she has never smoked. She has never been exposed to tobacco smoke. She has never used smokeless tobacco. She reports current alcohol use of about 36.0 standard drinks of alcohol per week.  She reports that she does not use drugs.   Family History: Family History  Problem Relation Age of Onset   Hypertension Mother    Thyroid disease Mother    Prostate cancer Father 8   AAA (abdominal aortic aneurysm) Sister    Breast cancer Paternal Grandmother 92   Breast cancer Cousin 27   Brain cancer Cousin    Lung cancer Cousin     Review of Systems: Review of Systems  Constitutional:  Negative for chills and fever.  Respiratory:  Negative for shortness of breath.   Cardiovascular:  Negative for chest pain.  Gastrointestinal:  Negative for abdominal pain, nausea and vomiting.    Physical Exam BP 112/74   Pulse (!) 59   Temp 97.8 F (36.6 C) (Oral)   Ht 5' 7 (1.702 m)   Wt 220 lb 12.8 oz (100.2 kg)   SpO2 97%   BMI 34.58 kg/m  CONSTITUTIONAL: No acute distress HEENT:  Normocephalic, atraumatic, extraocular motion intact. NECK: Trachea is midline, and  there is no jugular venous distension.  RESPIRATORY:  Lungs are clear, and breath sounds are equal bilaterally. Normal respiratory effort without pathologic use of accessory muscles. CARDIOVASCULAR: Heart is regular without murmurs, gallops, or rubs. BREAST: Left breast without palpable mass at the 12 o'clock position, 16 cm from the nipple as shown on prior imaging.  Palpable areas likely from inflammatory changes from the biopsy itself.  Otherwise no other palpable masses, skin changes, or nipple changes.  No left axillary lymphadenopathy.  Right breast without any palpable masses, skin changes, or nipple changes.  No right axillary lymphadenopathy. MUSCULOSKELETAL:  Normal muscle strength and tone in all four extremities.  No peripheral edema or cyanosis. SKIN: Skin turgor is normal. There are no pathologic skin lesions.  NEUROLOGIC:  Motor and sensation is grossly normal.  Cranial nerves are grossly intact. PSYCH:  Alert and oriented to person, place and time. Affect is normal.  Laboratory Analysis: Labs from  08/22/2024: Sodium 141, potassium 3.3, chloride 100, CO2 28, BUN 19, creatinine 1.1.  LFTs within normal limits.  WBC 7.3, hemoglobin 12.8, hematocrit 30.1, platelets 331.  Imaging: Mammogram and ultrasound on 08/27/2024: FINDINGS: Redemonstrated oval circumscribed mass in the upper-outer quadrant of the LEFT breast containing a ribbon biopsy marking clip measuring approximately 11 mm, correlating with biopsy-proven LEFT breast cancer. Post biopsy changes are noted in the surrounding breast. A stable LEFT breast mass is noted in the lower outer quadrant which has been present for many years and is considered benign. Normal-appearing LEFT axillary lymph nodes are visualized.   Targeted LEFT breast ultrasound was performed. At the LEFT breast 2 o'clock position 16 cm from the nipple there is an oval, near anechoic mass with angular margins measuring 12 x 6 x 11 mm, corresponding with biopsy-proven LEFT breast cancer. Allowing for post biopsy changes, it is not significantly changed in size from prior exam. A biopsy clip is noted within the mass.   Complete LEFT axillary ultrasound was performed without suspicious lymphadenopathy. Representative pictures were taken.   IMPRESSION: 1.  No LEFT axillary lymphadenopathy seen.   2. No significant interval change in size of biopsy-proven LEFT breast cancer, today measuring 12 x 6 x 11 mm.   RECOMMENDATION: Continued oncologic treatment plan for known  biopsy-proven LEFT breast malignancy.   I have discussed the findings and recommendations with the patient. If applicable, a reminder letter will be sent to the patient regarding the next appointment.   BI-RADS CATEGORY  6: Known biopsy-proven malignancy.  Assessment and Plan: This is a 64 y.o. female with newly diagnosed triple negative left breast cancer.  - Discussed with the patient the findings on her imaging studies and biopsy results.  Overall given the nature of her breast cancer, neoadjuvant  chemotherapy has been recommended.  As such, she requires Port-A-Cath placement.  Discussed with patient what her Port-A-Cath is and how it works and why it is needed for chemotherapy.  She is in agreement with placement of this. - Discussed with patient and the plan for a right sided Port-A-Cath placement and reviewed the surgery at length with her including the planned incision, risks of bleeding, infection, injury to surrounding structures, pneumothorax, that this would be an outpatient procedure, postoperative activity restrictions, pain control, and she is willing to proceed. - Patient is scheduled for surgery on 09/05/2024.  All of her questions have been answered.  I spent 40 minutes dedicated to the care of this patient on the date of this encounter to include pre-visit  review of records, face-to-face time with the patient discussing diagnosis and management, and any post-visit coordination of care.   Aloysius Sheree Plant, MD [facility]       [1]  Allergies Allergen Reactions   Penicillins Rash    Did it involve swelling of the face/tongue/throat, SOB, or low BP? No Did it involve sudden or severe rash/hives, skin peeling, or any reaction on the inside of your mouth or nose? No Did you need to seek medical attention at a hospital or doctor's office? No When did it last happen?     childhood allergy  If all above answers are NO, may proceed with cephalosporin use.    "

## 2024-09-03 ENCOUNTER — Telehealth: Payer: Self-pay | Admitting: *Deleted

## 2024-09-03 ENCOUNTER — Ambulatory Visit: Payer: Self-pay | Admitting: Surgery

## 2024-09-03 NOTE — Telephone Encounter (Signed)
 I contacted patient. She would like her continuous fmla to start 09/05/2024 FMLA forms completed-sent to Dr. Babara for her signature.

## 2024-09-04 ENCOUNTER — Inpatient Hospital Stay: Admission: RE | Admit: 2024-09-04 | Discharge: 2024-09-04 | Attending: Surgery

## 2024-09-04 DIAGNOSIS — E876 Hypokalemia: Secondary | ICD-10-CM | POA: Diagnosis not present

## 2024-09-04 DIAGNOSIS — Z01818 Encounter for other preprocedural examination: Secondary | ICD-10-CM | POA: Diagnosis not present

## 2024-09-04 DIAGNOSIS — Z01812 Encounter for preprocedural laboratory examination: Secondary | ICD-10-CM

## 2024-09-04 DIAGNOSIS — I10 Essential (primary) hypertension: Secondary | ICD-10-CM | POA: Insufficient documentation

## 2024-09-04 DIAGNOSIS — Z01810 Encounter for preprocedural cardiovascular examination: Secondary | ICD-10-CM | POA: Diagnosis present

## 2024-09-04 DIAGNOSIS — Z79899 Other long term (current) drug therapy: Secondary | ICD-10-CM | POA: Diagnosis not present

## 2024-09-04 LAB — BASIC METABOLIC PANEL WITH GFR
Anion gap: 12 (ref 5–15)
BUN: 14 mg/dL (ref 8–23)
CO2: 27 mmol/L (ref 22–32)
Calcium: 10.6 mg/dL — ABNORMAL HIGH (ref 8.9–10.3)
Chloride: 101 mmol/L (ref 98–111)
Creatinine, Ser: 0.78 mg/dL (ref 0.44–1.00)
GFR, Estimated: >60 mL/min
Glucose, Bld: 98 mg/dL (ref 70–99)
Potassium: 3.4 mmol/L — ABNORMAL LOW (ref 3.5–5.1)
Sodium: 140 mmol/L (ref 135–145)

## 2024-09-04 NOTE — Progress Notes (Addendum)
 Pharmacist Chemotherapy Monitoring - Initial Assessment    Anticipated start date: 09/11/24   The following has been reviewed per standard work regarding the patient's treatment regimen: The patient's diagnosis, treatment plan and drug doses, and organ/hematologic function Lab orders and baseline tests specific to treatment regimen  The treatment plan start date, drug sequencing, and pre-medications Prior authorization status  Patient's documented medication list, including drug-drug interaction screen and prescriptions for anti-emetics and supportive care specific to the treatment regimen The drug concentrations, fluid compatibility, administration routes, and timing of the medications to be used The patient's access for treatment and lifetime cumulative dose history, if applicable  The patient's medication allergies and previous infusion related reactions, if applicable   Changes made to treatment plan:  N/A  Follow up needed:  Baseline ECHO prior to anthracycline (cycle 5)  PA pending   Maudie FORBES Andreas, PharmD, BCPS Clinical Pharmacist   09/04/2024  12:21 PM

## 2024-09-05 ENCOUNTER — Encounter: Payer: Self-pay | Admitting: Occupational Therapy

## 2024-09-05 ENCOUNTER — Inpatient Hospital Stay

## 2024-09-05 ENCOUNTER — Ambulatory Visit: Attending: Oncology | Admitting: Occupational Therapy

## 2024-09-05 ENCOUNTER — Encounter: Payer: Self-pay | Admitting: Oncology

## 2024-09-05 ENCOUNTER — Ambulatory Visit: Admitting: Certified Registered"

## 2024-09-05 ENCOUNTER — Ambulatory Visit

## 2024-09-05 ENCOUNTER — Telehealth: Payer: Self-pay | Admitting: *Deleted

## 2024-09-05 ENCOUNTER — Encounter: Payer: Self-pay | Admitting: Surgery

## 2024-09-05 ENCOUNTER — Encounter
Admission: RE | Disposition: A | Discharge status: Home / Self Care | Payer: Self-pay | Source: Home / Self Care | Attending: Surgery

## 2024-09-05 ENCOUNTER — Inpatient Hospital Stay: Admitting: Licensed Clinical Social Worker

## 2024-09-05 ENCOUNTER — Other Ambulatory Visit: Payer: Self-pay

## 2024-09-05 ENCOUNTER — Ambulatory Visit
Admission: RE | Admit: 2024-09-05 | Discharge: 2024-09-05 | Disposition: A | Discharge status: Home / Self Care | Attending: Surgery | Admitting: Surgery

## 2024-09-05 DIAGNOSIS — R058 Other specified cough: Secondary | ICD-10-CM | POA: Insufficient documentation

## 2024-09-05 DIAGNOSIS — Z803 Family history of malignant neoplasm of breast: Secondary | ICD-10-CM | POA: Diagnosis not present

## 2024-09-05 DIAGNOSIS — Z5112 Encounter for antineoplastic immunotherapy: Secondary | ICD-10-CM | POA: Diagnosis not present

## 2024-09-05 DIAGNOSIS — I10 Essential (primary) hypertension: Secondary | ICD-10-CM | POA: Diagnosis not present

## 2024-09-05 DIAGNOSIS — C50412 Malignant neoplasm of upper-outer quadrant of left female breast: Secondary | ICD-10-CM | POA: Insufficient documentation

## 2024-09-05 DIAGNOSIS — Z171 Estrogen receptor negative status [ER-]: Secondary | ICD-10-CM | POA: Insufficient documentation

## 2024-09-05 DIAGNOSIS — C50912 Malignant neoplasm of unspecified site of left female breast: Secondary | ICD-10-CM | POA: Insufficient documentation

## 2024-09-05 DIAGNOSIS — Z1732 Human epidermal growth factor receptor 2 negative status: Secondary | ICD-10-CM | POA: Insufficient documentation

## 2024-09-05 DIAGNOSIS — Z8551 Personal history of malignant neoplasm of bladder: Secondary | ICD-10-CM | POA: Insufficient documentation

## 2024-09-05 DIAGNOSIS — Z1722 Progesterone receptor negative status: Secondary | ICD-10-CM | POA: Insufficient documentation

## 2024-09-05 HISTORY — PX: PORTACATH PLACEMENT: SHX2246

## 2024-09-05 MED ORDER — LIDOCAINE HCL (CARDIAC) PF 100 MG/5ML IV SOSY
PREFILLED_SYRINGE | INTRAVENOUS | Status: DC | PRN
  Administered 2024-09-05: 60 mg via INTRAVENOUS

## 2024-09-05 MED ORDER — CHLORHEXIDINE GLUCONATE 0.12 % MT SOLN
OROMUCOSAL | Status: AC
  Filled 2024-09-05: qty 15

## 2024-09-05 MED ORDER — ACETAMINOPHEN 10 MG/ML IV SOLN: 1000.0000 mg | Freq: Once | INTRAVENOUS | Status: DC | PRN

## 2024-09-05 MED ORDER — PROPOFOL 10 MG/ML IV BOLUS
INTRAVENOUS | Status: AC
  Filled 2024-09-05: qty 20

## 2024-09-05 MED ORDER — LIDOCAINE HCL (PF) 1 % IJ SOLN
INTRAMUSCULAR | Status: DC | PRN
  Administered 2024-09-05: 19 mL

## 2024-09-05 MED ORDER — DROPERIDOL 2.5 MG/ML IJ SOLN: 0.6250 mg | Freq: Once | INTRAMUSCULAR | Status: DC | PRN

## 2024-09-05 MED ORDER — MIDAZOLAM HCL 2 MG/2ML IJ SOLN
INTRAMUSCULAR | Status: AC
  Filled 2024-09-05: qty 2

## 2024-09-05 MED ORDER — CHLORHEXIDINE GLUCONATE CLOTH 2 % EX PADS: 6.0000 | MEDICATED_PAD | Freq: Once | CUTANEOUS | Status: DC

## 2024-09-05 MED ORDER — EPHEDRINE SULFATE-NACL 50-0.9 MG/10ML-% IV SOSY
PREFILLED_SYRINGE | INTRAVENOUS | Status: DC | PRN
  Administered 2024-09-05 (×2): 10 mg via INTRAVENOUS

## 2024-09-05 MED ORDER — CEFAZOLIN SODIUM-DEXTROSE 2-4 GM/100ML-% IV SOLN
INTRAVENOUS | Status: AC
  Filled 2024-09-05: qty 100

## 2024-09-05 MED ORDER — OXYCODONE HCL 5 MG PO TABS: 5.0000 mg | ORAL_TABLET | Freq: Four times a day (QID) | ORAL | 0 refills | Status: AC | PRN

## 2024-09-05 MED ORDER — GABAPENTIN 300 MG PO CAPS
300.0000 mg | ORAL_CAPSULE | ORAL | Status: AC
  Administered 2024-09-05: 300 mg via ORAL

## 2024-09-05 MED ORDER — SODIUM CHLORIDE 0.9 % IV SOLN
INTRAVENOUS | Status: AC | PRN
  Administered 2024-09-05: 500 mL via INTRAMUSCULAR

## 2024-09-05 MED ORDER — OXYCODONE HCL 5 MG PO TABS: 5.0000 mg | ORAL_TABLET | Freq: Once | ORAL | Status: DC | PRN

## 2024-09-05 MED ORDER — PROPOFOL 1000 MG/100ML IV EMUL
INTRAVENOUS | Status: AC
  Filled 2024-09-05: qty 100

## 2024-09-05 MED ORDER — MIDAZOLAM HCL (PF) 2 MG/2ML IJ SOLN
INTRAMUSCULAR | Status: DC | PRN
  Administered 2024-09-05: 2 mg via INTRAVENOUS

## 2024-09-05 MED ORDER — PROPOFOL 10 MG/ML IV BOLUS
INTRAVENOUS | Status: DC | PRN
  Administered 2024-09-05: 200 mg via INTRAVENOUS

## 2024-09-05 MED ORDER — DEXAMETHASONE SOD PHOSPHATE PF 10 MG/ML IJ SOLN
INTRAMUSCULAR | Status: AC
  Filled 2024-09-05: qty 1

## 2024-09-05 MED ORDER — ACETAMINOPHEN 500 MG PO TABS
ORAL_TABLET | ORAL | Status: AC
  Filled 2024-09-05: qty 2

## 2024-09-05 MED ORDER — SODIUM CHLORIDE 0.9 % IV SOLN
INTRAVENOUS | Status: DC | PRN
  Administered 2024-09-05: 10 mL via INTRAMUSCULAR

## 2024-09-05 MED ORDER — FENTANYL CITRATE (PF) 100 MCG/2ML IJ SOLN
INTRAMUSCULAR | Status: AC
  Filled 2024-09-05: qty 2

## 2024-09-05 MED ORDER — CEFAZOLIN SODIUM-DEXTROSE 2-4 GM/100ML-% IV SOLN
2.0000 g | INTRAVENOUS | Status: AC
  Administered 2024-09-05: 2 g via INTRAVENOUS

## 2024-09-05 MED ORDER — IBUPROFEN 600 MG PO TABS: 600.0000 mg | ORAL_TABLET | Freq: Three times a day (TID) | ORAL | 0 refills | Status: AC | PRN

## 2024-09-05 MED ORDER — PHENYLEPHRINE 80 MCG/ML (10ML) SYRINGE FOR IV PUSH (FOR BLOOD PRESSURE SUPPORT)
PREFILLED_SYRINGE | INTRAVENOUS | Status: DC | PRN
  Administered 2024-09-05 (×2): 80 ug via INTRAVENOUS
  Administered 2024-09-05: 160 ug via INTRAVENOUS

## 2024-09-05 MED ORDER — ONDANSETRON HCL 4 MG/2ML IJ SOLN
INTRAMUSCULAR | Status: DC | PRN
  Administered 2024-09-05: 4 mg via INTRAVENOUS

## 2024-09-05 MED ORDER — ACETAMINOPHEN 500 MG PO TABS: 1000.0000 mg | ORAL_TABLET | Freq: Four times a day (QID) | ORAL | Status: AC | PRN

## 2024-09-05 MED ORDER — GABAPENTIN 300 MG PO CAPS
ORAL_CAPSULE | ORAL | Status: AC
  Filled 2024-09-05: qty 1

## 2024-09-05 MED ORDER — DEXAMETHASONE SOD PHOSPHATE PF 10 MG/ML IJ SOLN
INTRAMUSCULAR | Status: DC | PRN
  Administered 2024-09-05: 4 mg via INTRAVENOUS

## 2024-09-05 MED ORDER — LACTATED RINGERS IV SOLN: INTRAVENOUS | Status: DC

## 2024-09-05 MED ORDER — OXYCODONE HCL 5 MG/5ML PO SOLN: 5.0000 mg | Freq: Once | ORAL | Status: DC | PRN

## 2024-09-05 MED ORDER — FENTANYL CITRATE (PF) 100 MCG/2ML IJ SOLN: 25.0000 ug | INTRAMUSCULAR | Status: DC | PRN

## 2024-09-05 MED ORDER — ONDANSETRON HCL 4 MG/2ML IJ SOLN
INTRAMUSCULAR | Status: AC
  Filled 2024-09-05: qty 2

## 2024-09-05 MED ORDER — BUPIVACAINE HCL (PF) 0.5 % IJ SOLN
INTRAMUSCULAR | Status: AC
  Filled 2024-09-05: qty 30

## 2024-09-05 MED ORDER — LIDOCAINE HCL (PF) 1 % IJ SOLN
INTRAMUSCULAR | Status: AC
  Filled 2024-09-05: qty 30

## 2024-09-05 MED ORDER — HEPARIN SODIUM (PORCINE) 5000 UNIT/ML IJ SOLN
INTRAMUSCULAR | Status: AC
  Filled 2024-09-05: qty 1

## 2024-09-05 MED ORDER — ACETAMINOPHEN 500 MG PO TABS
1000.0000 mg | ORAL_TABLET | ORAL | Status: AC
  Administered 2024-09-05: 1000 mg via ORAL

## 2024-09-05 MED ORDER — FENTANYL CITRATE (PF) 100 MCG/2ML IJ SOLN
INTRAMUSCULAR | Status: DC | PRN
  Administered 2024-09-05 (×2): 25 ug via INTRAVENOUS
  Administered 2024-09-05: 50 ug via INTRAVENOUS

## 2024-09-05 MED ORDER — CHLORHEXIDINE GLUCONATE 0.12 % MT SOLN
15.0000 mL | Freq: Once | OROMUCOSAL | Status: AC
  Administered 2024-09-05: 15 mL via OROMUCOSAL

## 2024-09-05 MED ORDER — SODIUM CHLORIDE (PF) 0.9 % IJ SOLN
INTRAMUSCULAR | Status: AC
  Filled 2024-09-05: qty 50

## 2024-09-05 MED ORDER — ORAL CARE MOUTH RINSE: 15.0000 mL | Freq: Once | OROMUCOSAL | Status: AC

## 2024-09-05 NOTE — Discharge Instructions (Signed)
 Discharge Instructions: 1.  Patient may shower, but do not scrub wounds heavily and dab dry only. 2.  Do not submerge wounds in pool/tub until fully healed. 3.  Do not apply ointments or hydrogen peroxide to the wounds. 4.  May apply ice packs to the wounds for comfort. 5.  Do not drive while taking narcotics for pain control.  Prior to driving, make sure you are able to rotate right and left to look at blindspots without significant pain or discomfort. 6.  Avoid strenuous activity with the right arm for two weeks.

## 2024-09-05 NOTE — Anesthesia Preprocedure Evaluation (Signed)
"                                    Anesthesia Evaluation  Patient identified by MRN, date of birth, ID band Patient awake    Reviewed: Allergy & Precautions, H&P , NPO status , Patient's Chart, lab work & pertinent test results, reviewed documented beta blocker date and time   Airway Mallampati: II  TM Distance: >3 FB Neck ROM: full    Dental  (+) Teeth Intact   Pulmonary shortness of breath   Pulmonary exam normal        Cardiovascular Exercise Tolerance: Good hypertension, negative cardio ROS Normal cardiovascular exam Rate:Normal     Neuro/Psych  PSYCHIATRIC DISORDERS       Neuromuscular disease    GI/Hepatic negative GI ROS, Neg liver ROS,,,  Endo/Other  negative endocrine ROS    Renal/GU negative Renal ROS  negative genitourinary   Musculoskeletal   Abdominal   Peds  Hematology  (+) Blood dyscrasia, anemia   Anesthesia Other Findings   Reproductive/Obstetrics negative OB ROS                              Anesthesia Physical Anesthesia Plan  ASA: 3  Anesthesia Plan: General LMA   Post-op Pain Management:    Induction:   PONV Risk Score and Plan:   Airway Management Planned:   Additional Equipment:   Intra-op Plan:   Post-operative Plan:   Informed Consent: I have reviewed the patients History and Physical, chart, labs and discussed the procedure including the risks, benefits and alternatives for the proposed anesthesia with the patient or authorized representative who has indicated his/her understanding and acceptance.       Plan Discussed with: CRNA  Anesthesia Plan Comments:         Anesthesia Quick Evaluation  "

## 2024-09-05 NOTE — Transfer of Care (Signed)
 Immediate Anesthesia Transfer of Care Note  Patient: Christina Holloway  Procedure(s) Performed: INSERTION, TUNNELED CENTRAL VENOUS DEVICE, WITH PORT (Right: Chest)  Patient Location: PACU  Anesthesia Type:General  Level of Consciousness: awake and alert   Airway & Oxygen Therapy: Patient Spontanous Breathing and Patient connected to face mask oxygen  Post-op Assessment: Report given to RN and Post -op Vital signs reviewed and stable  Post vital signs: Reviewed and stable  Last Vitals:  Vitals Value Taken Time  BP 115/77 09/05/24 14:30  Temp    Pulse 89 09/05/24 14:33  Resp 18 09/05/24 14:33  SpO2 94 % 09/05/24 14:33  Vitals shown include unfiled device data.  Last Pain:  Vitals:   09/05/24 1106  PainSc: 0-No pain         Complications: No notable events documented.

## 2024-09-05 NOTE — Op Note (Signed)
" °  Procedure Date:  09/05/2024  Pre-operative Diagnosis:  Left breast cancer  Post-operative Diagnosis: Left breast cancer  Procedure:  Right internal jugular port-a-cath placement  Surgeon:  Aloysius Sheree Plant, MD  Anesthesia:  General, with LMA  Estimated Blood Loss:  5 ml  Specimens:  None  Complications:  None  Indications for Procedure:  This is a 64 y.o. female who requires a port-a-cath for chemotherapy.  The risks of bleeding, infection, injury to surrounding structures, thrombosis, nonfunction, pneumothorax, hemothorax, and need for further procedures were discussed with the patient and was willing to proceed.  Description of Procedure: The patient was correctly identified in the preoperative area and brought into the operating room.  The patient was placed supine with VTE prophylaxis in place.  Appropriate time-outs were performed.  Anesthesia was induced and the patient was intubated.  Appropriate antibiotics were infused.  The right chest and neck were prepped and draped in usual sterile fashion. The patient was placed in Trendelenburg position and local anesthetic was infiltrated into the skin and subcutaneous tissues in the anterior chest wall. Ultrasound was used to evaluate her subclavian vein.  Unfortunately it was very deep and it was felt that it would be safer to use the right internal jugular.  Using ultrasound guidance, the large bore needle was placed into the right internal jugular vein without difficulty and then the Seldinger wire was advanced. Fluoroscopy was utilized to confirm that the Seldinger wire was in the superior vena cava.  The introducer dilator was placed over the Seldinger wire and the wire was removed. The previously flushed catheter was placed into the introducer dilator and the peel-away sheath was removed.  Fluoroscopy was used to confirm catheter location in the superior vena cava.  An incision was made and a port pocket developed with blunt and  electrocautery dissection. The catheter was then tunneled subcutaneously to the port pocket.  The catheter was then cut to appropriate length and attached to the previously flushed port. The port was placed into the pocket. Fluoroscopy again confirmed appropriate location of the catheter in the superior vena cava and revealed no kinking of the catheter at the insertion site.  The port was secured in place with 3-0 Prolenes and flushed for function and heparin  locked.  The wound was closed with interrupted 3-0 Vicryl followed by 4-0 subcuticular Monocryl sutures and sealed with DermaBond.  The patient was emerged from anesthesia and extubated and brought to the recovery room for further management.  A chest x-ray was ordered.  The patient tolerated the procedure well and all counts were correct at the end of the case.   Aloysius Sheree Plant, MD  "

## 2024-09-05 NOTE — Telephone Encounter (Signed)
 FMLA signed and completed for continuous leave. Patient picked up original copies on 09/05/24

## 2024-09-05 NOTE — Therapy (Signed)
 " OUTPATIENT OCCUPATIONAL THERAPY BREAST CANCER BASELINE EVALUATION   Patient Name: Christina Holloway MRN: [id number] DOB:1960-12-25, 64 y.o., female Today's Date: 09/05/2024  END OF SESSION:  OT End of Session - 09/05/24 1237     Visit Number 1    Number of Visits 3    Date for Recertification  11/28/24    OT Start Time 1000    OT Stop Time 1018    OT Time Calculation (min) 18 min    Activity Tolerance Patient tolerated treatment well    Behavior During Therapy [facility] for tasks assessed/performed          Past Medical History:  Diagnosis Date   Abnormal LFTs 05/02/2015   Anemia, iron deficiency 08/24/2003   in past   Arthritis    lower back, legs   Calcium  blood increased 05/02/2015   Cancer (HCC)    Colon polyp    Decreased potassium in the blood 05/02/2015   Essential (primary) hypertension 08/24/2003   Gout 05/02/2015   History of alcohol abuse    History of primary malignant neoplasm of urinary bladder    Hypercholesterolemia without hypertriglyceridemia 04/29/2009   Invasive ductal carcinoma of breast (HCC)    Malaise and fatigue 05/02/2015   Obesity (BMI 30.0-34.9)    Pre-diabetes    Sciatica    Shortness of breath dyspnea    1 flight stairs   Wears dentures    full upper, partial lower   Past Surgical History:  Procedure Laterality Date   BREAST BIOPSY Left 08/14/2024   US  LT BREAST BX W LOC DEV 1ST LESION IMG BX SPEC US  GUIDE 08/14/2024 [facility]   COLONOSCOPY WITH PROPOFOL  N/A 05/08/2015   Procedure: COLONOSCOPY WITH PROPOFOL ;  Surgeon: Rogelia Copping, MD;  Location: [facility];  Service: Endoscopy;  Laterality: N/A;   COLONOSCOPY WITH PROPOFOL  N/A 05/30/2018   Procedure: COLONOSCOPY WITH PROPOFOL ;  Surgeon: Unk Corinn Skiff, MD;  Location: [facility];  Service: Gastroenterology;  Laterality: N/A;   COLONOSCOPY WITH PROPOFOL  N/A 07/08/2023   Procedure: COLONOSCOPY WITH PROPOFOL ;  Surgeon: Unk Corinn Skiff, MD;  Location: [facility] ENDOSCOPY;   Service: Gastroenterology;  Laterality: N/A;   CYSTOSCOPY WITH FULGERATION  09/14/2019   Procedure: CYSTOSCOPY WITH FULGERATION;  Surgeon: Francisca Redell BROCKS, MD;  Location: [facility] ORS;  Service: Urology;;   ENDOMETRIAL ABLATION     POLYPECTOMY  05/08/2015   Procedure: POLYPECTOMY;  Surgeon: Rogelia Copping, MD;  Location: [facility];  Service: Endoscopy;;   POLYPECTOMY  07/08/2023   Procedure: POLYPECTOMY INTESTINAL;  Surgeon: Unk Corinn Skiff, MD;  Location: [facility] ENDOSCOPY;  Service: Gastroenterology;;   TRANSURETHRAL RESECTION OF BLADDER TUMOR WITH MITOMYCIN -C N/A 08/10/2019   Procedure: TRANSURETHRAL RESECTION OF BLADDER TUMOR WITH Gemcitabine ;  Surgeon: Francisca Redell BROCKS, MD;  Location: [facility] ORS;  Service: Urology;  Laterality: N/A;   VAGINAL HYSTERECTOMY     Patient Active Problem List   Diagnosis Date Noted   History of musculoskeletal disease 08/29/2024   History of primary malignant neoplasm of urinary bladder 08/29/2024   Family history of cancer 08/23/2024   Dry cough 08/23/2024   Invasive ductal carcinoma of breast (HCC) 08/22/2024   History of gout 10/06/2023   Prediabetes 10/06/2023   Adenomatous polyp of transverse colon 07/08/2023   Hyperglycemia 03/21/2023   Shortness of breath 02/15/2020   Alcohol abuse 02/15/2020   Morbid obesity (HCC) 10/15/2019   Constipation 10/15/2019   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 (HCC) 09/07/2019   Right knee pain  12/08/2015   History of colonic polyps    Gout 05/02/2015   Hypercalcemia 05/02/2015   Malaise and fatigue 05/02/2015   Hypercholesterolemia without hypertriglyceridemia 04/29/2009   Anemia, iron deficiency 08/24/2003   Essential (primary) hypertension 08/24/2003    PCP: Dr Myrla  REFERRING PROVIDER: DR Babara MART DIAG: L breast Cancer   THERAPY DIAG:  Infiltrating ductal carcinoma of left breast [facility])  Rationale for Evaluation and Treatment: Rehabilitation  ONSET DATE:  08/14/24  SUBJECTIVE:                                                                                                                                                                                           SUBJECTIVE STATEMENT: I am doing okay - going to have chemo first - have my port placement today - and they are talking about lumpectomy in the future  PERTINENT HISTORY:  Oncology note 08/29/24 ASSESSMENT & PLAN:    Invasive ductal carcinoma of breast [facility]) Pathology and radiology counseling: Discussed with the patient, the details of pathology including the type of breast cancer,the clinical staging, the significance of ER, PR and HER-2/neu receptors and the implications for treatment. After reviewing the pathology in detail, we proceeded to discuss the different treatment options between surgery, radiation, chemotherapy, antiestrogen therapies.   cT1c cN0 left invasive ductal carcinoma, Triple negative Recommend neoadjuvant chemotherapy followed by surgery radiation, adjuvant treatment.  She is not interested in clinical trial.  Recommend neoadjuvant carboplatin Taxol Keytruda followed by [facility] + Keytruda.  I explained to the patient the risks and benefits of chemotherapy including all but not limited to infusion reaction, hair loss, hearing loss, mouth sore, nausea, vomiting, low blood counts, bleeding, heart failure, kidney failure, neuropathy and risk of life threatening infection and even death, secondary malignancy etc.  I discussed the mechanism of action and rationale of using immunotherapy.  Discussed the potential side effects of immunotherapy including but not limited to colitis/diarrhea; skin rash; pneumonitis/respiratory failure, nephritis, muscle/joint pain, elevated LFTs/liver failure,endocrine abnormalities, neuropathy, etc. Patient voices understanding and agrees with proceeding with treatment.  Patient voices understanding and willing to proceed chemotherapy.    # Chemotherapy  education; Medi port placement by surgeon.  Antiemetics-Zofran  and Compazine ; EMLA  cream sent to pharmacy Supportive care measures are necessary for patient well-being and will be provided as necessary. We spent sufficient time to discuss many aspect of care, questions were answered to patient's satisfaction.    Dry cough Persistent dry cough. Check CT chest images.      History of primary malignant neoplasm of urinary bladder Recent CT hematuria work up did not show metastatic bladder cancer  seen within the abdomen or pelvis    Hypercalcemia Possibly due to dehydration or hydrochlorothiazide . Plan to check PTH, PTHrp in the future  PATIENT GOALS:   reduce lymphedema risk and learn post op HEP.   PAIN:  Are you having pain? No  PRECAUTIONS: Active CA , L UE lymphedema after surgery      HAND DOMINANCE: right  WEIGHT BEARING RESTRICTIONS: No  FALLS:  Has patient fallen in last 6 months? 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 The patient will be reassessed on a regular basis (typically every 3 months) to obtain new L-Dex scores. If the score is > 6.5 points away from his/her baseline score indicating onset of subclinical lymphedema, it will be recommended to wear a compression garment for 4 weeks, 12 hours per day and then be reassessed. If the score continues to be > 6.5 points from baseline at  reassessment, we will initiate lymphedema treatment. Assessing in this manner has a 95% rate of preventing clinically significant lymphedema.   L-DEX FLOWSHEETS - 09/05/24 1200       L-DEX LYMPHEDEMA SCREENING   Measurement Type Unilateral    L-DEX MEASUREMENT EXTREMITY Upper Extremity    POSITION  Standing    DOMINANT SIDE Right    At Risk Side Left    BASELINE SCORE (UNILATERAL) 4.3        BASELINE   PATIENT EDUCATION:  Education details: Lymphedema risk reduction ed initiated   Person educated: Patient Education method: Explanation, Demonstration, Handout Education comprehension: Patient verbalized understanding and returned demonstration  .   ASSESSMENT:  CLINICAL IMPRESSION: Her multidisciplinary medical team has met to assess and determine a recommended treatment plan. She is planning to have  chemo first and then possible lumpectomy per pt with radiation -initiate lymphedema education - done baseline AROM and strength assessment - as well as SOZO baseline  She will benefit from a post op OT reassessment to determine needs and from L-Dex screens every 3 months for 2 years to detect subclinical lymphedema.  Pt will benefit from skilled therapeutic intervention to improve on the following deficits: Decreased knowledge of precautions and lymphedema education, impaired UE functional use, pain, decreased ROM, postural dysfunction.   OT treatment/interventions: ADL/self-care home management, pt/family education, therapeutic exercise,manual therapy  REHAB POTENTIAL: Good  CLINICAL DECISION MAKING: Stable/uncomplicated  EVALUATION COMPLEXITY: Low   GOALS: Goals reviewed with patient? YES  LONG TERM GOALS: (STG=LTG)    Name Target Date Goal status  1 Pt will be able to verbalize understanding of pertinent lymphedema risk reduction practices relevant to her dx specifically related to skin care.  Baseline:  No knowledge 12 wks Initial  2 Pt will be able to return demo  and/or verbalize understanding of the post op HEP related to regaining shoulder ROM. Baseline:  No knowledge 12 wks Initial          4 Pt will demo she has regained  full shoulder ROM and function post operatively compared to baselines.  Baseline: See objective measurements taken today. 12 wks Initial    PLAN:  OT FREQUENCY/DURATION: EVAL and 2 follow up appointment.   Occupational Therapy Information for After Breast Cancer Surgery/Treatment:  Lymphedema is a swelling condition that you may be at risk for in your arm if you have lymph nodes removed from the armpit area.  After a sentinel node biopsy, the risk is approximately 5-9% and is higher after an axillary node dissection.  There is treatment available for this condition and it is not life-threatening.  Contact your physician or occupational therapist with concerns. You may begin the 4 shoulder/posture exercises (see additional sheet) when permitted by your physician (typically a week after surgery).  If you have drains, you may need to wait until those are removed before beginning range of motion exercises.  A general recommendation is to not lift your arms above shoulder height until drains are removed.  These exercises should be done to your tolerance and gently.  This is not a no pain/no gain type of recovery so listen to your body and stretch into the range of motion that you can tolerate, stopping if you have pain.  If you are having immediate reconstruction, ask your plastic surgeon about doing exercises as he or she may want you to wait. .  While undergoing any medical procedure or treatment, try to avoid blood pressure being taken or needle sticks from occurring on the arm on the side of cancer.   This recommendation begins after surgery and continues for the rest of your life.  This may help reduce your risk of getting lymphedema (swelling in your arm). An excellent resource for those seeking information on lymphedema is the  National Lymphedema Network's web site. It can be accessed at www.lymphnet.org If you notice swelling in your hand, arm or breast at any time following surgery (even if it is many years from now), please contact your doctor or occupational therapist to discuss this.  Lymphedema can be treated at any time but it is easier for you if it is treated early on.  If you feel like your shoulder motion is not returning to normal in a reasonable amount of time, please contact your surgeon or occupational therapist.  [facility] [phone]. [address], [place], [place] [address]      Ancel Peters, OTR/L,CLT 09/05/2024, 12:39 PM   "

## 2024-09-05 NOTE — Interval H&P Note (Signed)
 History and Physical Interval Note:  09/05/2024 12:24 PM  Christina Holloway  has presented today for surgery, with the diagnosis of Infiltrating ductal carcinoma of left breast.  The various methods of treatment have been discussed with the patient and family. After consideration of risks, benefits and other options for treatment, the patient has consented to  Procedures: INSERTION, TUNNELED CENTRAL VENOUS DEVICE, WITH PORT (Right) as a surgical intervention.  The patient's history has been reviewed, patient examined, no change in status, stable for surgery.  I have reviewed the patient's chart and labs.  Questions were answered to the patient's satisfaction.     Lamira Borin

## 2024-09-05 NOTE — Anesthesia Procedure Notes (Signed)
 Procedure Name: LMA Insertion Date/Time: 09/05/2024 1:11 PM  Performed by: Niki Manus SAUNDERS, CRNAPre-anesthesia Checklist: Patient identified, Patient being monitored, Timeout performed, Emergency Drugs available and Suction available Patient Re-evaluated:Patient Re-evaluated prior to induction Oxygen Delivery Method: Circle system utilized Preoxygenation: Pre-oxygenation with 100% oxygen Induction Type: IV induction Ventilation: Mask ventilation without difficulty LMA: LMA inserted LMA Size: 4.0 Tube type: Oral Number of attempts: 1 Placement Confirmation: positive ETCO2 and breath sounds checked- equal and bilateral Tube secured with: Tape Dental Injury: Teeth and Oropharynx as per pre-operative assessment

## 2024-09-06 ENCOUNTER — Encounter: Payer: Self-pay | Admitting: Surgery

## 2024-09-07 NOTE — Anesthesia Postprocedure Evaluation (Signed)
"   Anesthesia Post Note  Patient: Christina Holloway  Procedure(s) Performed: INSERTION, TUNNELED CENTRAL VENOUS DEVICE, WITH PORT (Right: Chest)  Patient location during evaluation: PACU Anesthesia Type: General Level of consciousness: awake and alert Pain management: pain level controlled Vital Signs Assessment: post-procedure vital signs reviewed and stable Respiratory status: spontaneous breathing, nonlabored ventilation, respiratory function stable and patient connected to nasal cannula oxygen Cardiovascular status: blood pressure returned to baseline and stable Postop Assessment: no apparent nausea or vomiting Anesthetic complications: no   No notable events documented.   Last Vitals:  Vitals:   09/05/24 1500 09/05/24 1517  BP: 124/78 107/71  Pulse: 75 66  Resp: 11 16  Temp: (!) 36.1 C (!) 36.2 C  SpO2: 100% 100%    Last Pain:  Vitals:   09/05/24 1517  TempSrc: Temporal  PainSc: 0-No pain                 Lynwood KANDICE Clause      "

## 2024-09-11 ENCOUNTER — Inpatient Hospital Stay: Admitting: Oncology

## 2024-09-11 ENCOUNTER — Encounter: Payer: Self-pay | Admitting: *Deleted

## 2024-09-11 ENCOUNTER — Encounter: Payer: Self-pay | Admitting: Oncology

## 2024-09-11 ENCOUNTER — Inpatient Hospital Stay

## 2024-09-11 VITALS — BP 104/74 | HR 80

## 2024-09-11 VITALS — BP 134/77 | HR 75 | Temp 97.8°F | Resp 18 | Wt 216.1 lb

## 2024-09-11 DIAGNOSIS — R058 Other specified cough: Secondary | ICD-10-CM

## 2024-09-11 DIAGNOSIS — C50912 Malignant neoplasm of unspecified site of left female breast: Secondary | ICD-10-CM

## 2024-09-11 DIAGNOSIS — Z5112 Encounter for antineoplastic immunotherapy: Secondary | ICD-10-CM | POA: Diagnosis not present

## 2024-09-11 DIAGNOSIS — Z5111 Encounter for antineoplastic chemotherapy: Secondary | ICD-10-CM | POA: Diagnosis not present

## 2024-09-11 DIAGNOSIS — E041 Nontoxic single thyroid nodule: Secondary | ICD-10-CM | POA: Insufficient documentation

## 2024-09-11 LAB — CBC WITH DIFFERENTIAL (CANCER CENTER ONLY)
Abs Immature Granulocytes: 0.03 K/uL (ref 0.00–0.07)
Basophils Absolute: 0 K/uL (ref 0.0–0.1)
Basophils Relative: 1 %
Eosinophils Absolute: 0.2 K/uL (ref 0.0–0.5)
Eosinophils Relative: 3 %
HCT: 35.9 % — ABNORMAL LOW (ref 36.0–46.0)
Hemoglobin: 12.1 g/dL (ref 12.0–15.0)
Immature Granulocytes: 1 %
Lymphocytes Relative: 30 %
Lymphs Abs: 1.9 K/uL (ref 0.7–4.0)
MCH: 30.3 pg (ref 26.0–34.0)
MCHC: 33.7 g/dL (ref 30.0–36.0)
MCV: 89.8 fL (ref 80.0–100.0)
Monocytes Absolute: 0.4 K/uL (ref 0.1–1.0)
Monocytes Relative: 7 %
Neutro Abs: 3.7 K/uL (ref 1.7–7.7)
Neutrophils Relative %: 58 %
Platelet Count: 342 K/uL (ref 150–400)
RBC: 4 MIL/uL (ref 3.87–5.11)
RDW: 12.9 % (ref 11.5–15.5)
WBC Count: 6.4 K/uL (ref 4.0–10.5)
nRBC: 0 % (ref 0.0–0.2)

## 2024-09-11 LAB — CMP (CANCER CENTER ONLY)
ALT: 30 U/L (ref 0–44)
AST: 23 U/L (ref 15–41)
Albumin: 4.6 g/dL (ref 3.5–5.0)
Alkaline Phosphatase: 71 U/L (ref 38–126)
Anion gap: 12 (ref 5–15)
BUN: 17 mg/dL (ref 8–23)
CO2: 25 mmol/L (ref 22–32)
Calcium: 10.4 mg/dL — ABNORMAL HIGH (ref 8.9–10.3)
Chloride: 103 mmol/L (ref 98–111)
Creatinine: 0.75 mg/dL (ref 0.44–1.00)
GFR, Estimated: >60 mL/min
Glucose, Bld: 95 mg/dL (ref 70–99)
Potassium: 3.5 mmol/L (ref 3.5–5.1)
Sodium: 140 mmol/L (ref 135–145)
Total Bilirubin: 0.3 mg/dL (ref 0.0–1.2)
Total Protein: 7.4 g/dL (ref 6.5–8.1)

## 2024-09-11 LAB — TSH: TSH: 0.25 u[IU]/mL — ABNORMAL LOW (ref 0.350–4.500)

## 2024-09-11 MED ORDER — DIPHENHYDRAMINE HCL 50 MG/ML IJ SOLN
50.0000 mg | Freq: Once | INTRAMUSCULAR | Status: AC
  Administered 2024-09-11: 50 mg via INTRAVENOUS
  Filled 2024-09-11: qty 1

## 2024-09-11 MED ORDER — PALONOSETRON HCL INJECTION 0.25 MG/5ML
0.2500 mg | Freq: Once | INTRAVENOUS | Status: AC
  Administered 2024-09-11: 0.25 mg via INTRAVENOUS
  Filled 2024-09-11: qty 5

## 2024-09-11 MED ORDER — SODIUM CHLORIDE 0.9 % IV SOLN
200.0000 mg | Freq: Once | INTRAVENOUS | Status: AC
  Administered 2024-09-11: 200 mg via INTRAVENOUS
  Filled 2024-09-11: qty 8

## 2024-09-11 MED ORDER — FAMOTIDINE IN NACL 20-0.9 MG/50ML-% IV SOLN
20.0000 mg | Freq: Once | INTRAVENOUS | Status: AC
  Administered 2024-09-11: 20 mg via INTRAVENOUS
  Filled 2024-09-11: qty 50

## 2024-09-11 MED ORDER — SODIUM CHLORIDE 0.9 % IV SOLN
INTRAVENOUS | Status: DC
  Filled 2024-09-11: qty 250

## 2024-09-11 MED ORDER — SODIUM CHLORIDE 0.9 % IV SOLN
80.0000 mg/m2 | Freq: Once | INTRAVENOUS | Status: AC
  Administered 2024-09-11: 174 mg via INTRAVENOUS
  Filled 2024-09-11: qty 29

## 2024-09-11 MED ORDER — SODIUM CHLORIDE 0.9 % IV SOLN
211.0500 mg | Freq: Once | INTRAVENOUS | Status: AC
  Administered 2024-09-11: 210 mg via INTRAVENOUS
  Filled 2024-09-11: qty 21

## 2024-09-11 MED ORDER — DEXAMETHASONE SOD PHOSPHATE PF 10 MG/ML IJ SOLN
10.0000 mg | Freq: Once | INTRAMUSCULAR | Status: AC
  Administered 2024-09-11: 10 mg via INTRAVENOUS
  Filled 2024-09-11: qty 1

## 2024-09-11 NOTE — Assessment & Plan Note (Signed)
 CT chest wo contrast showed no lung lesion or lymadenopathy

## 2024-09-11 NOTE — Assessment & Plan Note (Signed)
Obtain thyroid ultrasound 

## 2024-09-11 NOTE — Assessment & Plan Note (Signed)
 Chemotherapy plan as listed above

## 2024-09-11 NOTE — Patient Instructions (Signed)

## 2024-09-11 NOTE — Progress Notes (Signed)
 " Hematology/Oncology Consult Note [phone] [phone]      CHIEF COMPLAINTS/PURPOSE OF CONSULTATION:  Left breast cancer.   ASSESSMENT & PLAN:   Invasive ductal carcinoma of breast [facility]) Pathology and radiology counseling: Discussed with the patient, the details of pathology including the type of breast cancer,the clinical staging, the significance of ER, PR and HER-2/neu receptors and the implications for treatment. After reviewing the pathology in detail, we proceeded to discuss the different treatment options between surgery, radiation, chemotherapy, antiestrogen therapies.  cT1c cN0 left invasive ductal carcinoma, Triple negative Genetic counseling pending.  Recommend neoadjuvant chemotherapy followed by surgery radiation, adjuvant treatment.  She is not interested in clinical trial.  Recommend neoadjuvant carboplatin  Taxol  Keytruda  followed by [facility] + Keytruda .   Labs are reviewed and discussed with patient. Proceed with cycle 1 D1 carboplatin  Taxol  Keytruda   Discussed about antiemetics instructions.   Dry cough CT chest wo contrast showed no lung lesion or lymadenopathy   Hypercalcemia Possibly due to  hydrochlorothiazide . check PTH, PTHrp   Encounter for antineoplastic chemotherapy Chemotherapy plan as listed above.   Orders Placed This Encounter  Procedures   US  THYROID    Standing Status:   Future    Expected Date:   09/18/2024    Expiration Date:   09/11/2025    Reason for Exam (SYMPTOM  OR DIAGNOSIS REQUIRED):   thyroid nodule    Preferred imaging location?:   [facility]   Follow-up in 1 week. All questions were answered. The patient knows to call the clinic with any problems, questions or concerns.  Zelphia Cap, MD, PhD [facility] Hematology Oncology 09/11/2024    HISTORY OF PRESENTING ILLNESS:  Christina Holloway 64 y.o. female presents to establish care for left breast invasive ductal carcinoma, Triple negative.  I have reviewed her  chart and materials related to her cancer extensively and collaborated history with the patient. Summary of oncologic history is as follows: Oncology History  Invasive ductal carcinoma of breast (HCC)  08/05/2024 Mammogram   Bilateral screening mammogram showed FINDINGS: In the left breast, a possible mass about the upper-outer breast, middle to posterior third, warrants further evaluation. In the right breast, no findings suspicious for malignancy.   IMPRESSION: Further evaluation is suggested for possible mass in the left breast.   RECOMMENDATION: Ultrasound of the left breast. (Code:US -L-23M)   08/08/2024 Imaging   Left breast ultrasound showed Near anechoic LEFT breast cyst versus mass, corresponding with the abnormality described at the time of screening. Ultrasound-guided aspiration with possible conversion to biopsy is recommended.  RECOMMENDATION: Ultrasound-guided cyst aspiration with possible conversion to biopsy of the LEFT breast x1   08/22/2024 Initial Diagnosis   Invasive ductal carcinoma of breast   1. Breast, left, needle core biopsy, 2 o'clock, 16cmfn, 11mm, ribbon clip :       INVASIVE DUCTAL CARCINOMA, SEE NOTE       TUBULE FORMATION: SCORE 3       NUCLEAR PLEOMORPHISM: SCORE 3       MITOTIC COUNT: SCORE 2       TOTAL SCORE: 8       OVERALL GRADE: 3       LYMPHOVASCULAR INVASION: NOT IDENTIFIED       CANCER LENGTH: 1.1 CM       CALCIFICATIONS: NOT IDENTIFIED       OTHER FINDINGS: NONE         ER -, PR- HER2 -   08/23/2024 Cancer Staging   Staging form: Breast,  AJCC 8th Edition - Clinical stage from 08/23/2024: Stage IB (cT1c, cN0, cM0, G3, ER-, PR-, HER2-) - Signed by Babara Call, MD on 08/29/2024 Stage prefix: Initial diagnosis Histologic grading system: 3 grade system   09/11/2024 -  Chemotherapy   Patient is on Treatment Plan : BREAST Pembrolizumab  (200) D1 + Carboplatin  (1.5) D1,8,15 + Paclitaxel  (80) D1,8,15 q21d X 4 cycles / Pembrolizumab  (200) D1 +  AC D1 q21d x 4 cycles      Patient presented for evaluation prior to chemotherapy.  Patient has been to chemotherapy class. She reports persistent dry cough since late November.  No unintentional weight loss, night sweats, chest pain, hemoptysis.  MEDICAL HISTORY:  Past Medical History:  Diagnosis Date   Abnormal LFTs 05/02/2015   Anemia, iron deficiency 08/24/2003   in past   Arthritis    lower back, legs   Calcium  blood increased 05/02/2015   Cancer (HCC)    Colon polyp    Decreased potassium in the blood 05/02/2015   Essential (primary) hypertension 08/24/2003   Gout 05/02/2015   History of alcohol abuse    History of primary malignant neoplasm of urinary bladder    Hypercholesterolemia without hypertriglyceridemia 04/29/2009   Invasive ductal carcinoma of breast (HCC)    Malaise and fatigue 05/02/2015   Obesity (BMI 30.0-34.9)    Pre-diabetes    Sciatica    Shortness of breath dyspnea    1 flight stairs   Wears dentures    full upper, partial lower    SURGICAL HISTORY: Past Surgical History:  Procedure Laterality Date   BREAST BIOPSY Left 08/14/2024   US  LT BREAST BX W LOC DEV 1ST LESION IMG BX SPEC US  GUIDE 08/14/2024 [facility]   COLONOSCOPY WITH PROPOFOL  N/A 05/08/2015   Procedure: COLONOSCOPY WITH PROPOFOL ;  Surgeon: Rogelia Copping, MD;  Location: [facility];  Service: Endoscopy;  Laterality: N/A;   COLONOSCOPY WITH PROPOFOL  N/A 05/30/2018   Procedure: COLONOSCOPY WITH PROPOFOL ;  Surgeon: Unk Corinn Skiff, MD;  Location: [facility] ENDOSCOPY;  Service: Gastroenterology;  Laterality: N/A;   COLONOSCOPY WITH PROPOFOL  N/A 07/08/2023   Procedure: COLONOSCOPY WITH PROPOFOL ;  Surgeon: Unk Corinn Skiff, MD;  Location: [facility] ENDOSCOPY;  Service: Gastroenterology;  Laterality: N/A;   CYSTOSCOPY WITH FULGERATION  09/14/2019   Procedure: CYSTOSCOPY WITH FULGERATION;  Surgeon: Francisca Redell BROCKS, MD;  Location: [facility] ORS;  Service: Urology;;   ENDOMETRIAL ABLATION      POLYPECTOMY  05/08/2015   Procedure: POLYPECTOMY;  Surgeon: Rogelia Copping, MD;  Location: [facility];  Service: Endoscopy;;   POLYPECTOMY  07/08/2023   Procedure: POLYPECTOMY INTESTINAL;  Surgeon: Unk Corinn Skiff, MD;  Location: [facility] ENDOSCOPY;  Service: Gastroenterology;;   PORTACATH PLACEMENT Right 09/05/2024   Procedure: INSERTION, TUNNELED CENTRAL VENOUS DEVICE, WITH PORT;  Surgeon: Desiderio Schanz, MD;  Location: [facility] ORS;  Service: General;  Laterality: Right;   TRANSURETHRAL RESECTION OF BLADDER TUMOR WITH MITOMYCIN -C N/A 08/10/2019   Procedure: TRANSURETHRAL RESECTION OF BLADDER TUMOR WITH Gemcitabine ;  Surgeon: Francisca Redell BROCKS, MD;  Location: [facility] ORS;  Service: Urology;  Laterality: N/A;   VAGINAL HYSTERECTOMY      SOCIAL HISTORY: Social History   Socioeconomic History   Marital status: Single    Spouse name: Not on file   Number of children: Not on file   Years of education: Not on file   Highest education level: Not on file  Occupational History   Not on file  Tobacco Use   Smoking status: Never  Passive exposure: Never   Smokeless tobacco: Never  Vaping Use   Vaping status: Never Used  Substance and Sexual Activity   Alcohol use: Yes    Alcohol/week: 36.0 standard drinks of alcohol    Types: 36 Cans of beer per week    Comment: socially   Drug use: No   Sexual activity: Yes    Birth control/protection: Surgical  Other Topics Concern   Not on file  Social History Narrative   Not on file   Social Drivers of Health   Tobacco Use: Low Risk (09/11/2024)   Patient History    Smoking Tobacco Use: Never    Smokeless Tobacco Use: Never    Passive Exposure: Never  Financial Resource Strain: Not on file  Food Insecurity: No Food Insecurity (08/22/2024)   Epic    Worried About Programme Researcher, Broadcasting/film/video in the Last Year: Never true    Ran Out of Food in the Last Year: Never true  Transportation Needs: No Transportation Needs (08/22/2024)   Epic    Lack of  Transportation (Medical): No    Lack of Transportation (Non-Medical): No  Physical Activity: Not on file  Stress: Not on file  Social Connections: Not on file  Intimate Partner Violence: Not At Risk (08/22/2024)   Epic    Fear of Current or Ex-Partner: No    Emotionally Abused: No    Physically Abused: No    Sexually Abused: No  Depression (PHQ2-9): Low Risk (09/11/2024)   Depression (PHQ2-9)    PHQ-2 Score: 0  Alcohol Screen: Low Risk (06/03/2022)   Alcohol Screen    Last Alcohol Screening Score (AUDIT): 5  Housing: Low Risk (08/22/2024)   Epic    Unable to Pay for Housing in the Last Year: No    Number of Times Moved in the Last Year: 1    Homeless in the Last Year: No  Utilities: Not At Risk (08/22/2024)   Epic    Threatened with loss of utilities: No  Health Literacy: Not on file    FAMILY HISTORY: Family History  Problem Relation Age of Onset   Hypertension Mother    Thyroid disease Mother    Prostate cancer Father 9   AAA (abdominal aortic aneurysm) Sister    Breast cancer Paternal Grandmother 68   Breast cancer Cousin 64   Brain cancer Cousin    Lung cancer Cousin     ALLERGIES:  is allergic to penicillins.  MEDICATIONS:  Current Outpatient Medications  Medication Sig Dispense Refill   acetaminophen  (TYLENOL ) 500 MG tablet Take 2 tablets (1,000 mg total) by mouth every 6 (six) hours as needed for mild pain (pain score 1-3).     amLODipine  (NORVASC ) 10 MG tablet Take 1 tablet (10 mg total) by mouth daily. (Patient taking differently: Take 10 mg by mouth every morning.) 90 tablet 2   CALCIUM  PO Take 1 tablet by mouth daily.     cetirizine  (ZYRTEC ) 10 MG tablet Take 1 tablet (10 mg total) by mouth daily. 90 tablet 3   colchicine  0.6 MG tablet TAKE 2 TABLETS BY MOUTH AT FIRST SIGN OF GOUT FLARE. THEN TAKE 1 TABLET 1 HOURLATER. CONTINUE 1 TABLET TWICE A DAY UNTIL 48 HOURS AFTER RESOLUTION OF FLARE 30 tablet 1   cyclobenzaprine  (FLEXERIL ) 5 MG tablet Take 1 tablet  (5 mg total) by mouth 3 (three) times daily as needed for muscle spasms. 30 tablet 0   dexamethasone  (DECADRON ) 4 MG tablet Take 2 tablets daily  for 2 days, start the day after chemotherapy. Take with food. 30 tablet 1   docusate sodium  (COLACE) 100 MG capsule Take 100 mg by mouth daily.     fluticasone  (FLONASE ) 50 MCG/ACT nasal spray Place 2 sprays into both nostrils daily. 16 g 6   hydrochlorothiazide  (HYDRODIURIL ) 25 MG tablet Take 1 tablet (25 mg total) by mouth daily. 90 tablet 2   HYDROcodone -acetaminophen  (NORCO/VICODIN) 5-325 MG tablet Take 1 tablet by mouth every 6 (six) hours as needed for moderate pain (pain score 4-6). 20 tablet 0   ibuprofen  (ADVIL ) 600 MG tablet Take 1 tablet (600 mg total) by mouth every 8 (eight) hours as needed for moderate pain (pain score 4-6). 60 tablet 0   lidocaine -prilocaine  (EMLA ) cream Apply to affected area once 30 g 3   losartan  (COZAAR ) 100 MG tablet Take 1 tablet (100 mg total) by mouth daily. 90 tablet 2   naproxen  (NAPROSYN ) 500 MG tablet TAKE 1 TABLET BY MOUTH TWICE A DAY WITH MEALS 60 tablet 1   ondansetron  (ZOFRAN ) 8 MG tablet Take 1 tablet (8 mg total) by mouth every 8 (eight) hours as needed for nausea or vomiting. Start on the third day after chemotherapy. 30 tablet 1   oxyCODONE  (OXY IR/ROXICODONE ) 5 MG immediate release tablet Take 1 tablet (5 mg total) by mouth every 6 (six) hours as needed for severe pain (pain score 7-10). 10 tablet 0   prochlorperazine  (COMPAZINE ) 10 MG tablet Take 1 tablet (10 mg total) by mouth every 6 (six) hours as needed for nausea or vomiting. 30 tablet 1   rosuvastatin  (CRESTOR ) 5 MG tablet Take 1 tablet (5 mg total) by mouth daily. 90 tablet 2   No current facility-administered medications for this visit.   Facility-Administered Medications Ordered in Other Visits  Medication Dose Route Frequency Provider Last Rate Last Admin   0.9 %  sodium chloride  infusion   Intravenous Continuous Babara Call, MD   Stopped at  09/11/24 1454    Review of Systems  Constitutional:  Negative for appetite change, chills, fatigue and fever.  HENT:   Negative for hearing loss and voice change.   Eyes:  Negative for eye problems.  Respiratory:  Positive for cough. Negative for chest tightness, hemoptysis and shortness of breath.   Cardiovascular:  Negative for chest pain.  Gastrointestinal:  Negative for abdominal distention, abdominal pain and blood in stool.  Endocrine: Negative for hot flashes.  Genitourinary:  Negative for difficulty urinating and frequency.   Musculoskeletal:  Negative for arthralgias.  Skin:  Negative for itching and rash.  Neurological:  Negative for extremity weakness.  Hematological:  Negative for adenopathy.  Psychiatric/Behavioral:  Negative for confusion.      PHYSICAL EXAMINATION: ECOG PERFORMANCE STATUS: 0 - Asymptomatic  Vitals:   09/11/24 0948 09/11/24 0954  BP: (!) 147/86 134/77  Pulse: 75   Resp: 18   Temp: 97.8 F (36.6 C)   SpO2: 98%    Filed Weights   09/11/24 0948  Weight: 216 lb 1.6 oz (98 kg)    Physical Exam Constitutional:      General: She is not in acute distress.    Appearance: She is not diaphoretic.  HENT:     Head: Normocephalic and atraumatic.  Eyes:     General: No scleral icterus. Cardiovascular:     Rate and Rhythm: Normal rate and regular rhythm.  Pulmonary:     Effort: Pulmonary effort is normal. No respiratory distress.     Breath  sounds: No wheezing.  Abdominal:     General: There is no distension.     Palpations: Abdomen is soft.     Tenderness: There is no abdominal tenderness.  Musculoskeletal:        General: Normal range of motion.     Cervical back: Normal range of motion and neck supple.  Skin:    General: Skin is warm and dry.     Findings: No erythema.  Neurological:     Mental Status: She is alert and oriented to person, place, and time. Mental status is at baseline.     Motor: No abnormal muscle tone.  Psychiatric:         Mood and Affect: Mood and affect normal.     LABORATORY DATA:  I have reviewed the data as listed    Latest Ref Rng & Units 09/11/2024    9:31 AM 08/22/2024   12:16 PM 03/21/2023    8:44 AM  CBC  WBC 4.0 - 10.5 K/uL 6.4  7.3  6.6   Hemoglobin 12.0 - 15.0 g/dL 87.8  87.1  87.5   Hematocrit 36.0 - 46.0 % 35.9  38.1  37.1   Platelets 150 - 400 K/uL 342  331  331       Latest Ref Rng & Units 09/11/2024    9:40 AM 09/04/2024    8:07 AM 08/22/2024   12:16 PM  CMP  Glucose 70 - 99 mg/dL 95  98  890   BUN 8 - 23 mg/dL 17  14  19    Creatinine 0.44 - 1.00 mg/dL 9.24  9.21  8.89   Sodium 135 - 145 mmol/L 140  140  141   Potassium 3.5 - 5.1 mmol/L 3.5  3.4  3.3   Chloride 98 - 111 mmol/L 103  101  100   CO2 22 - 32 mmol/L 25  27  28    Calcium  8.9 - 10.3 mg/dL 89.5  89.3  89.2   Total Protein 6.5 - 8.1 g/dL 7.4   7.9   Total Bilirubin 0.0 - 1.2 mg/dL 0.3   0.4   Alkaline Phos 38 - 126 U/L 71   77   AST 15 - 41 U/L 23   33   ALT 0 - 44 U/L 30   38      RADIOGRAPHIC STUDIES: I have personally reviewed the radiological images as listed and agreed with the findings in the report. CT Chest Wo Contrast Result Date: 09/10/2024 CLINICAL DATA:  History breast cancer and bladder cancer. Dry cough. EXAM: CT CHEST WITHOUT CONTRAST TECHNIQUE: Multidetector CT imaging of the chest was performed following the standard protocol without IV contrast. RADIATION DOSE REDUCTION: This exam was performed according to the departmental dose-optimization program which includes automated exposure control, adjustment of the mA and/or kV according to patient size and/or use of iterative reconstruction technique. COMPARISON:  Prior chest CT 02/24/2008 and prior CT abdomen/pelvis 08/10/2024 FINDINGS: Cardiovascular: The heart is normal in size. No pericardial effusion. The aorta is normal in caliber. Minimal calcifications at the aortic arch. Stable scattered coronary artery calcifications. Mediastinum/Nodes: No  mediastinal or hilar mass or lymphadenopathy. The esophagus is unremarkable. There is a 2.2 cm left thyroid nodule. Recommend thyroid US  (ref: J Am Coll Radiol. 2015 Feb;12(2): 143-50). Lungs/Pleura: No acute pulmonary process. No infiltrates, edema or effusions. No pulmonary lesions or nodules. Subpleural atelectasis noted adjacent to large right-sided vertebral body spurs. The tracheobronchial tree is unremarkable. No findings suspicious for bronchitis  or endobronchial lesion. Upper Abdomen: No significant upper abdominal findings. Partially rim calcified gallstone noted in the fundal region of the gallbladder measuring 17 mm. No hepatic or adrenal gland lesions. No upper abdominal adenopathy. Musculoskeletal: No breast masses, supraclavicular or axillary adenopathy. The bony thorax is intact. No lytic or sclerotic bone lesions. Age advanced degenerative spurring changes involving the thoracic spine. IMPRESSION: 1. No acute pulmonary findings or worrisome pulmonary lesions. 2. No mediastinal or hilar mass or adenopathy. 3. 2.2 cm left thyroid nodule. Recommend follow-up thyroid ultrasound examination. 4. Cholelithiasis. Electronically Signed   By: MYRTIS Stammer M.D.   On: 09/10/2024 14:54   DG CHEST PORT 1 VIEW Result Date: 09/05/2024 CLINICAL DATA:  Port-A-Cath placement EXAM: PORTABLE CHEST 1 VIEW COMPARISON:  August 08, 2019 FINDINGS: Stable cardiomediastinal silhouette. Interval placement of right internal jugular Port-A-Cath with distal tip in SVC. No pneumothorax. Hypoinflation of the lungs with minimal bibasilar subsegmental atelectasis. IMPRESSION: Interval placement of right internal jugular Port-A-Cath with distal tip in SVC. No pneumothorax. Electronically Signed   By: Lynwood Landy Raddle M.D.   On: 09/05/2024 15:10   DG C-Arm 1-60 Min-No Report Result Date: 09/05/2024 Fluoroscopy was utilized by the requesting physician.  No radiographic interpretation.   MM 3D DIAGNOSTIC MAMMOGRAM UNILATERAL  LEFT BREAST Result Date: 08/27/2024 CLINICAL DATA:  History of recently diagnosed LEFT breast cancer. Oncologist desires axillary evaluation and dedicated diagnostic views of the LEFT breast. EXAM: DIGITAL DIAGNOSTIC UNILATERAL LEFT MAMMOGRAM WITH TOMOSYNTHESIS AND CAD; ULTRASOUND LEFT BREAST LIMITED TECHNIQUE: Left digital diagnostic mammography and breast tomosynthesis was performed. The images were evaluated with computer-aided detection. ; Targeted ultrasound examination of the left breast was performed. COMPARISON:  Previous exam(s). ACR Breast Density Category b: There are scattered areas of fibroglandular density. FINDINGS: Redemonstrated oval circumscribed mass in the upper-outer quadrant of the LEFT breast containing a ribbon biopsy marking clip measuring approximately 11 mm, correlating with biopsy-proven LEFT breast cancer. Post biopsy changes are noted in the surrounding breast. A stable LEFT breast mass is noted in the lower outer quadrant which has been present for many years and is considered benign. Normal-appearing LEFT axillary lymph nodes are visualized. Targeted LEFT breast ultrasound was performed. At the LEFT breast 2 o'clock position 16 cm from the nipple there is an oval, near anechoic mass with angular margins measuring 12 x 6 x 11 mm, corresponding with biopsy-proven LEFT breast cancer. Allowing for post biopsy changes, it is not significantly changed in size from prior exam. A biopsy clip is noted within the mass. Complete LEFT axillary ultrasound was performed without suspicious lymphadenopathy. Representative pictures were taken. IMPRESSION: 1.  No LEFT axillary lymphadenopathy seen. 2. No significant interval change in size of biopsy-proven LEFT breast cancer, today measuring 12 x 6 x 11 mm. RECOMMENDATION: Continued oncologic treatment plan for known biopsy-proven LEFT breast malignancy. I have discussed the findings and recommendations with the patient. If applicable, a reminder  letter will be sent to the patient regarding the next appointment. BI-RADS CATEGORY  6: Known biopsy-proven malignancy. Electronically Signed   By: Norleen Croak M.D.   On: 08/27/2024 16:21   US  LIMITED ULTRASOUND INCLUDING AXILLA LEFT BREAST  Result Date: 08/27/2024 CLINICAL DATA:  History of recently diagnosed LEFT breast cancer. Oncologist desires axillary evaluation and dedicated diagnostic views of the LEFT breast. EXAM: DIGITAL DIAGNOSTIC UNILATERAL LEFT MAMMOGRAM WITH TOMOSYNTHESIS AND CAD; ULTRASOUND LEFT BREAST LIMITED TECHNIQUE: Left digital diagnostic mammography and breast tomosynthesis was performed. The images were evaluated with computer-aided detection. ;  Targeted ultrasound examination of the left breast was performed. COMPARISON:  Previous exam(s). ACR Breast Density Category b: There are scattered areas of fibroglandular density. FINDINGS: Redemonstrated oval circumscribed mass in the upper-outer quadrant of the LEFT breast containing a ribbon biopsy marking clip measuring approximately 11 mm, correlating with biopsy-proven LEFT breast cancer. Post biopsy changes are noted in the surrounding breast. A stable LEFT breast mass is noted in the lower outer quadrant which has been present for many years and is considered benign. Normal-appearing LEFT axillary lymph nodes are visualized. Targeted LEFT breast ultrasound was performed. At the LEFT breast 2 o'clock position 16 cm from the nipple there is an oval, near anechoic mass with angular margins measuring 12 x 6 x 11 mm, corresponding with biopsy-proven LEFT breast cancer. Allowing for post biopsy changes, it is not significantly changed in size from prior exam. A biopsy clip is noted within the mass. Complete LEFT axillary ultrasound was performed without suspicious lymphadenopathy. Representative pictures were taken. IMPRESSION: 1.  No LEFT axillary lymphadenopathy seen. 2. No significant interval change in size of biopsy-proven LEFT breast  cancer, today measuring 12 x 6 x 11 mm. RECOMMENDATION: Continued oncologic treatment plan for known biopsy-proven LEFT breast malignancy. I have discussed the findings and recommendations with the patient. If applicable, a reminder letter will be sent to the patient regarding the next appointment. BI-RADS CATEGORY  6: Known biopsy-proven malignancy. Electronically Signed   By: Norleen Croak M.D.   On: 08/27/2024 16:21   US  LT BREAST BX W LOC DEV 1ST LESION IMG BX SPEC US  GUIDE Addendum Date: 08/22/2024 ADDENDUM REPORT: 08/22/2024 09:39 ADDENDUM: The patient's axillary lymph nodes were not evaluated at the time of diagnostic imaging. If sonographic evaluation of the LEFT axilla is clinically desired, that exam should be ordered separately. This was discussed with nurse navigator Shasta Ada via telephone. Electronically Signed   By: Norleen Croak M.D.   On: 08/22/2024 09:39   Addendum Date: 08/21/2024 ADDENDUM REPORT: 08/21/2024 07:46 ADDENDUM: PATHOLOGY revealed: 1. Breast, left, needle core biopsy, 2 o'clock, 16 cmfn, 11 mm, ribbon clip- INVASIVE DUCTAL CARCINOMA, OVERALL GRADE: 3- LYMPHOVASCULAR INVASION: NOT IDENTIFIED- CANCER LENGTH: 1.1 CM- CALCIFICATIONS: NOT IDENTIFIED Pathology results are CONCORDANT with imaging findings, per Dr. Norleen Croak. Pathology results and recommendations below were discussed with patient by telephone. Patient reported biopsy site doing well with no adverse symptoms, and only slight tenderness at the site. Post biopsy care instructions were reviewed, questions were answered and my direct phone number was provided. Patient was instructed to call [facility] for any additional questions or concerns related to biopsy site. RECOMMENDATIONS: 1. Surgical and oncological consultation. Request for surgical and oncological consultation was relayed to Shasta Ada, Nurse Navigator at [facility]. Pathology results reported by Mliss CHARM Molt RN 08/17/2024.  Electronically Signed   By: Norleen Croak M.D.   On: 08/21/2024 07:46   Result Date: 08/22/2024 CLINICAL DATA:  Indeterminate LEFT breast cyst versus mass EXAM: ULTRASOUND GUIDED LEFT BREAST CORE NEEDLE BIOPSY COMPARISON:  Previous exam(s). PROCEDURE: I met with the patient and we discussed the procedure of ultrasound-guided aspiration and biopsy, including benefits and alternatives. We discussed the high likelihood of a successful procedure. We discussed the risks of the procedure, including infection, bleeding, tissue injury, clip migration, and inadequate sampling. Informed written consent was given. The usual time-out protocol was performed immediately prior to the procedure. Lesion quadrant: Upper-outer Well numbing, an attempt was made to aspirate the LEFT breast  mass. When the abnormality fail to aspirate, we proceeded to biopsy as below. Using sterile technique and 1% Lidocaine  as local anesthetic, under direct ultrasound visualization, a 14 gauge spring-loaded device was used to perform biopsy of the LEFT breast mass at 2 o'clock 16 cm from the nipple using a anti radial approach. At the conclusion of the procedure ribbon shaped tissue marker clip was deployed into the biopsy cavity. Follow up 2 view mammogram was performed and dictated separately. IMPRESSION: Ultrasound guided biopsy of the LEFT breast mass at 2 o'clock 16 cm from the nipple. No apparent complications. Electronically Signed: By: Norleen Croak M.D. On: 08/14/2024 09:13   MM CLIP PLACEMENT LEFT Result Date: 08/14/2024 CLINICAL DATA:  Status post LEFT breast biopsy EXAM: 3D DIAGNOSTIC LEFT MAMMOGRAM POST ULTRASOUND BIOPSY COMPARISON:  Previous exam(s). ACR Breast Density Category b: There are scattered areas of fibroglandular density. FINDINGS: 3D Mammographic images were obtained following ultrasound guided biopsy of the LEFT breast mass at 2 o'clock 16 cm from the nipple. The biopsy marking clip is in expected position at the site of  biopsy. IMPRESSION: Appropriate positioning of the ribbon shaped biopsy marking clip at the site of biopsy in the LEFT breast. Final Assessment: Post Procedure Mammograms for Marker Placement Electronically Signed   By: Norleen Croak M.D.   On: 08/14/2024 09:08    "

## 2024-09-11 NOTE — Assessment & Plan Note (Signed)
 Possibly due to  hydrochlorothiazide . check PTH, PTHrp

## 2024-09-11 NOTE — Assessment & Plan Note (Addendum)
 Pathology and radiology counseling: Discussed with the patient, the details of pathology including the type of breast cancer,the clinical staging, the significance of ER, PR and HER-2/neu receptors and the implications for treatment. After reviewing the pathology in detail, we proceeded to discuss the different treatment options between surgery, radiation, chemotherapy, antiestrogen therapies.  cT1c cN0 left invasive ductal carcinoma, Triple negative Genetic counseling pending.  Recommend neoadjuvant chemotherapy followed by surgery radiation, adjuvant treatment.  She is not interested in clinical trial.  Recommend neoadjuvant carboplatin  Taxol  Keytruda  followed by [facility] + Keytruda .   Labs are reviewed and discussed with patient. Proceed with cycle 1 D1 carboplatin  Taxol  Keytruda   Discussed about antiemetics instructions.

## 2024-09-11 NOTE — Patient Instructions (Signed)

## 2024-09-12 ENCOUNTER — Telehealth: Payer: Self-pay

## 2024-09-12 ENCOUNTER — Inpatient Hospital Stay: Admitting: Hospice and Palliative Medicine

## 2024-09-12 DIAGNOSIS — C50912 Malignant neoplasm of unspecified site of left female breast: Secondary | ICD-10-CM

## 2024-09-12 LAB — PARATHYROID HORMONE, INTACT (NO CA): PTH: 62 pg/mL (ref 15–65)

## 2024-09-12 LAB — T4: T4, Total: 7 ug/dL (ref 4.5–12.0)

## 2024-09-12 NOTE — Progress Notes (Signed)
 Multidisciplinary Oncology Council Documentation  ZALIAH WISSNER was presented by our [facility] on 09/12/2024, which included representatives from:  Palliative Care Dietitian  Physical/Occupational Therapist Nurse Navigator Genetics Social work Survivorship RN Financial Navigator Research RN   Addysin currently presents with history of breast cancer  We reviewed previous medical and familial history, history of present illness, and recent lab results along with all available histopathologic and imaging studies. The MOC considered available treatment options and made the following recommendations/referrals:  Nutrition  The MOC is a meeting of clinicians from various specialty areas who evaluate and discuss patients for whom a multidisciplinary approach is being considered. Final determinations in the plan of care are those of the provider(s).   Today's extended care, comprehensive team conference, Carsyn was not present for the discussion and was not examined.

## 2024-09-12 NOTE — Telephone Encounter (Signed)
Telephone call to patient for follow up after receiving first infusion.   No answer but left message stating we were calling to check on them.  Encouraged patient to call for any questions or concerns.   

## 2024-09-13 ENCOUNTER — Encounter: Payer: Self-pay | Admitting: Licensed Clinical Social Worker

## 2024-09-13 ENCOUNTER — Inpatient Hospital Stay

## 2024-09-13 ENCOUNTER — Inpatient Hospital Stay: Admitting: Licensed Clinical Social Worker

## 2024-09-13 ENCOUNTER — Other Ambulatory Visit: Payer: Self-pay | Admitting: Licensed Clinical Social Worker

## 2024-09-13 DIAGNOSIS — Z8551 Personal history of malignant neoplasm of bladder: Secondary | ICD-10-CM | POA: Diagnosis not present

## 2024-09-13 DIAGNOSIS — Z801 Family history of malignant neoplasm of trachea, bronchus and lung: Secondary | ICD-10-CM

## 2024-09-13 DIAGNOSIS — Z1379 Encounter for other screening for genetic and chromosomal anomalies: Secondary | ICD-10-CM

## 2024-09-13 DIAGNOSIS — Z8042 Family history of malignant neoplasm of prostate: Secondary | ICD-10-CM

## 2024-09-13 DIAGNOSIS — C50912 Malignant neoplasm of unspecified site of left female breast: Secondary | ICD-10-CM | POA: Diagnosis not present

## 2024-09-13 DIAGNOSIS — Z8041 Family history of malignant neoplasm of ovary: Secondary | ICD-10-CM | POA: Diagnosis not present

## 2024-09-13 DIAGNOSIS — Z8051 Family history of malignant neoplasm of kidney: Secondary | ICD-10-CM | POA: Diagnosis not present

## 2024-09-13 DIAGNOSIS — Z803 Family history of malignant neoplasm of breast: Secondary | ICD-10-CM | POA: Diagnosis not present

## 2024-09-13 DIAGNOSIS — Z800 Family history of malignant neoplasm of digestive organs: Secondary | ICD-10-CM

## 2024-09-13 LAB — GENETIC SCREENING ORDER

## 2024-09-13 NOTE — Progress Notes (Signed)
 REFERRING PROVIDER: Babara Call, MD [address] [place],  [place] [address]  PRIMARY PROVIDER:  Myrla Jon HERO, MD  PRIMARY REASON FOR VISIT:  1. Infiltrating ductal carcinoma of left breast (HCC)   2. History of primary malignant neoplasm of urinary bladder   3. Family history of breast cancer   4. Family history of prostate cancer   5. Family history of ovarian cancer   6. Family history of colon cancer   7. Family history of pancreatic cancer   8. Family history of lung cancer   9. Family history of kidney cancer      HISTORY OF PRESENT ILLNESS:   Ms. Whitehouse, a 64 y.o. female, was seen for a [facility] cancer genetics consultation at the request of Dr. Babara due to a personal and family history of cancer.  Ms. Katayama presents to clinic today to discuss the possibility of a hereditary predisposition to cancer, genetic testing, and to further clarify her future cancer risks, as well as potential cancer risks for family members.   CANCER HISTORY:  In 2020, at the age of 88, Ms. Skare was diagnosed with bladder cancer treated with immunotherapy.  In 2025, at the age of 56, Ms. Stamas was diagnosed with invasive ductal carcinoma of the left breast, triple negative. Treatment plan currently includes neoadjuvant chemotherapy.   Oncology History  Invasive ductal carcinoma of breast (HCC)  08/05/2024 Mammogram   Bilateral screening mammogram showed FINDINGS: In the left breast, a possible mass about the upper-outer breast, middle to posterior third, warrants further evaluation. In the right breast, no findings suspicious for malignancy.   IMPRESSION: Further evaluation is suggested for possible mass in the left breast.   RECOMMENDATION: Ultrasound of the left breast. (Code:US -[id number])   08/08/2024 Imaging   Left breast ultrasound showed Near anechoic LEFT breast cyst versus mass, corresponding with the abnormality described at the time of screening. Ultrasound-guided aspiration  with possible conversion to biopsy is recommended.  RECOMMENDATION: Ultrasound-guided cyst aspiration with possible conversion to biopsy of the LEFT breast x1   08/22/2024 Initial Diagnosis   Invasive ductal carcinoma of breast   1. Breast, left, needle core biopsy, 2 o'clock, 16cmfn, 11mm, ribbon clip :       INVASIVE DUCTAL CARCINOMA, SEE NOTE       TUBULE FORMATION: SCORE 3       NUCLEAR PLEOMORPHISM: SCORE 3       MITOTIC COUNT: SCORE 2       TOTAL SCORE: 8       OVERALL GRADE: 3       LYMPHOVASCULAR INVASION: NOT IDENTIFIED       CANCER LENGTH: 1.1 CM       CALCIFICATIONS: NOT IDENTIFIED       OTHER FINDINGS: NONE         ER -, PR- HER2 -   08/23/2024 Cancer Staging   Staging form: Breast, AJCC 8th Edition - Clinical stage from 08/23/2024: Stage IB (cT1c, cN0, cM0, G3, ER-, PR-, HER2-) - Signed by Babara Call, MD on 08/29/2024 Stage prefix: Initial diagnosis Histologic grading system: 3 grade system   09/11/2024 -  Chemotherapy   Patient is on Treatment Plan : BREAST Pembrolizumab  (200) D1 + Carboplatin  (1.5) D1,8,15 + Paclitaxel  (80) D1,8,15 q21d X 4 cycles / Pembrolizumab  (200) D1 + AC D1 q21d x 4 cycles       RELEVANT MEDICAL HISTORY:  Menarche was at age 60.  Ovaries intact: yes.  Hysterectomy: yes.  Menopausal status:  postmenopausal.  Colonoscopy: yes; 1 polyp.   Past Medical History:  Diagnosis Date   Abnormal LFTs 05/02/2015   Anemia, iron deficiency 08/24/2003   in past   Arthritis    lower back, legs   Calcium  blood increased 05/02/2015   Cancer (HCC)    Colon polyp    Decreased potassium in the blood 05/02/2015   Essential (primary) hypertension 08/24/2003   Gout 05/02/2015   History of alcohol abuse    History of primary malignant neoplasm of urinary bladder    Hypercholesterolemia without hypertriglyceridemia 04/29/2009   Invasive ductal carcinoma of breast (HCC)    Malaise and fatigue 05/02/2015   Obesity (BMI 30.0-34.9)    Pre-diabetes     Sciatica    Shortness of breath dyspnea    1 flight stairs   Wears dentures    full upper, partial lower    Past Surgical History:  Procedure Laterality Date   BREAST BIOPSY Left 08/14/2024   US  LT BREAST BX W LOC DEV 1ST LESION IMG BX SPEC US  GUIDE 08/14/2024 [facility]   COLONOSCOPY WITH PROPOFOL  N/A 05/08/2015   Procedure: COLONOSCOPY WITH PROPOFOL ;  Surgeon: Rogelia Copping, MD;  Location: [facility];  Service: Endoscopy;  Laterality: N/A;   COLONOSCOPY WITH PROPOFOL  N/A 05/30/2018   Procedure: COLONOSCOPY WITH PROPOFOL ;  Surgeon: Unk Corinn Skiff, MD;  Location: [facility] ENDOSCOPY;  Service: Gastroenterology;  Laterality: N/A;   COLONOSCOPY WITH PROPOFOL  N/A 07/08/2023   Procedure: COLONOSCOPY WITH PROPOFOL ;  Surgeon: Unk Corinn Skiff, MD;  Location: [facility] ENDOSCOPY;  Service: Gastroenterology;  Laterality: N/A;   CYSTOSCOPY WITH FULGERATION  09/14/2019   Procedure: CYSTOSCOPY WITH FULGERATION;  Surgeon: Francisca Redell BROCKS, MD;  Location: [facility] ORS;  Service: Urology;;   ENDOMETRIAL ABLATION     POLYPECTOMY  05/08/2015   Procedure: POLYPECTOMY;  Surgeon: Rogelia Copping, MD;  Location: [facility];  Service: Endoscopy;;   POLYPECTOMY  07/08/2023   Procedure: POLYPECTOMY INTESTINAL;  Surgeon: Unk Corinn Skiff, MD;  Location: [facility] ENDOSCOPY;  Service: Gastroenterology;;   PORTACATH PLACEMENT Right 09/05/2024   Procedure: INSERTION, TUNNELED CENTRAL VENOUS DEVICE, WITH PORT;  Surgeon: Desiderio Schanz, MD;  Location: [facility] ORS;  Service: General;  Laterality: Right;   TRANSURETHRAL RESECTION OF BLADDER TUMOR WITH MITOMYCIN -C N/A 08/10/2019   Procedure: TRANSURETHRAL RESECTION OF BLADDER TUMOR WITH Gemcitabine ;  Surgeon: Francisca Redell BROCKS, MD;  Location: [facility] ORS;  Service: Urology;  Laterality: N/A;   VAGINAL HYSTERECTOMY      FAMILY HISTORY:  We obtained a detailed, 4-generation family history.  Significant diagnoses are listed below: Family History  Problem Relation Age of  Onset   Hypertension Mother    Thyroid disease Mother    Prostate cancer Father 51       metastatic   AAA (abdominal aortic aneurysm) Sister    Ovarian cancer Maternal Aunt        80s   Lung cancer Maternal Uncle    Colon cancer Maternal Grandmother 24   Breast cancer Paternal Grandmother 8   Breast cancer Cousin 60   Lung cancer Cousin    Pancreatic cancer Cousin    Kidney cancer Cousin    Breast cancer Cousin    Kidney cancer Cousin    Breast cancer Cousin        d. under 40   Kidney cancer Cousin      Maternal aunt - ovarian cancer d. 35s Maternal uncle - lung cancer Maternal first cousins x3 - lung cancer Maternal first cousin -  breast cancer Maternal first cousin - kidney cancer Maternal first cousin - liver cancer Maternal first cousin - pancreatic cancer Maternal 1st cousins once removed - breast cancer in one, renal medullary carcinoma in another Maternal grandmother - colon cancer d. 38 Father- prostate cancer, metastatic, d.  90 Paternal grandmother - breast cancer dx 44, d. Stroke Paternal first cousin - lung cancer Paternal first cousin - kidney cancer Paternal first cousin once removed - breast cancer under 53  Ms. Randleman is unaware of previous family history of genetic testing for hereditary cancer risks. There is no reported Ashkenazi Jewish ancestry. There is no known consanguinity.  GENETIC COUNSELING ASSESSMENT: Ms. Roundy is a 64 y.o. female with a personal history of triple negative breast cancer and family history of cancer which is somewhat suggestive of a hereditary cancer syndrome and predisposition to cancer. We, therefore, discussed and recommended the following at today's visit.   DISCUSSION: We discussed that, in general, most cancer is not inherited in families, but instead is sporadic or familial. We discussed that approximately 10% of breast cancer is hereditary. Most cases of hereditary breast cancer are associated with BRCA1/BRCA2 genes,  although there are other genes associated with hereditary cancer as well. Cancers and risks are gene specific. We discussed that testing is beneficial for several reasons including knowing about cancer risks, identifying potential screening and risk-reduction options that may be appropriate, and to understand if other family members could be at risk for cancer and allow them to undergo genetic testing.   We reviewed the characteristics, features and inheritance patterns of hereditary cancer syndromes. We also discussed genetic testing, including the appropriate family members to test, the process of testing, insurance coverage and turn-around-time for results. We discussed the implications of a negative, positive and/or variant of uncertain significant result. We recommended Ms. Sanford pursue genetic testing for the [facility] CancerNext-Expanded+RNA gene panel.   The CancerNext-Expanded gene panel offered by [facility] and includes sequencing, rearrangement, and RNA analysis for the following 77 genes: AIP, ALK, APC, ATM, AXIN2, BAP1, BARD1, BMPR1A, BRCA1, BRCA2, BRIP1, CDC73, CDH1, CDK4, CDKN1B, CDKN2A, CEBPA, CHEK2, CTNNA1, DDX41, DICER1, ETV6, FH, FLCN, GATA2, LZTR1, MAX, MBD4, MEN1, MET, MLH1, MSH2, MSH3, MSH6, MUTYH, NF1, NF2, NTHL1, PALB2, PHOX2B, PMS2, POT1, PRKAR1A, PTCH1, PTEN, RAD51C, RAD51D, RB1, RET, RPS20, RUNX1, SDHA, SDHAF2, SDHB, SDHC, SDHD, SMAD4, SMARCA4, SMARCB1, SMARCE1, STK11, SUFU, TMEM127, TP53, TSC1, TSC2, VHL, and WT1 (sequencing and deletion/duplication); EGFR, HOXB13, KIT, MITF, PDGFRA, POLD1, and POLE (sequencing only); EPCAM and GREM1 (deletion/duplication only).   Based on Ms. Elizarraraz personal and family history of cancer, she meets medical criteria for genetic testing. Despite that she meets criteria, she may still have an out of pocket cost.   PLAN: After considering the risks, benefits, and limitations, Ms. Niday provided informed consent to pursue genetic testing and the blood  sample was sent to [facility] for analysis of the CancerNext-Expanded+RNA panel. Results should be available within approximately 2-3 weeks' time, at which point they will be disclosed by telephone to Ms. Selman, as will any additional recommendations warranted by these results. Ms. Giannotti will receive a summary of her genetic counseling visit and a copy of her results once available. This information will also be available in Epic.   Ms. Jans questions were answered to her satisfaction today. Our contact information was provided should additional questions or concerns arise. Thank you for the referral and allowing us  to share in the care of your patient.   Dena Cary, MS,  [facility] Genetic Counselor [email or web address] Phone: [phone]  I personally spent a total of 45 minutes in the care of the patient today including getting/reviewing separately obtained history, counseling and educating, placing orders, and documenting clinical information in the EHR.  Dr. Delinda was available for discussion regarding this case.   _______________________________________________________________________ For Office Staff:  Number of people involved in session: 2 - patient's sister Rock was also present.  Was an Intern/ student involved with case: no

## 2024-09-13 NOTE — Progress Notes (Signed)
 gen

## 2024-09-17 ENCOUNTER — Ambulatory Visit
Admission: RE | Admit: 2024-09-17 | Discharge: 2024-09-17 | Disposition: A | Discharge status: Home / Self Care | Source: Ambulatory Visit | Attending: Oncology | Admitting: Oncology

## 2024-09-17 DIAGNOSIS — E041 Nontoxic single thyroid nodule: Secondary | ICD-10-CM | POA: Diagnosis present

## 2024-09-17 LAB — PTH-RELATED PEPTIDE: PTH-related peptide: <2 pmol/L

## 2024-09-18 ENCOUNTER — Inpatient Hospital Stay

## 2024-09-18 ENCOUNTER — Inpatient Hospital Stay: Admitting: Oncology

## 2024-09-18 ENCOUNTER — Encounter: Payer: Self-pay | Admitting: Oncology

## 2024-09-18 VITALS — BP 143/80 | HR 79

## 2024-09-18 VITALS — BP 134/89 | HR 89 | Temp 97.6°F | Resp 19 | Wt 222.6 lb

## 2024-09-18 DIAGNOSIS — Z5111 Encounter for antineoplastic chemotherapy: Secondary | ICD-10-CM

## 2024-09-18 DIAGNOSIS — C50912 Malignant neoplasm of unspecified site of left female breast: Secondary | ICD-10-CM

## 2024-09-18 DIAGNOSIS — E041 Nontoxic single thyroid nodule: Secondary | ICD-10-CM | POA: Diagnosis not present

## 2024-09-18 DIAGNOSIS — Z809 Family history of malignant neoplasm, unspecified: Secondary | ICD-10-CM | POA: Diagnosis not present

## 2024-09-18 DIAGNOSIS — K5900 Constipation, unspecified: Secondary | ICD-10-CM | POA: Diagnosis not present

## 2024-09-18 DIAGNOSIS — Z5112 Encounter for antineoplastic immunotherapy: Secondary | ICD-10-CM | POA: Diagnosis not present

## 2024-09-18 LAB — CMP (CANCER CENTER ONLY)
ALT: 38 U/L (ref 0–44)
AST: 24 U/L (ref 15–41)
Albumin: 4.2 g/dL (ref 3.5–5.0)
Alkaline Phosphatase: 65 U/L (ref 38–126)
Anion gap: 10 (ref 5–15)
BUN: 12 mg/dL (ref 8–23)
CO2: 27 mmol/L (ref 22–32)
Calcium: 10 mg/dL (ref 8.9–10.3)
Chloride: 102 mmol/L (ref 98–111)
Creatinine: 0.73 mg/dL (ref 0.44–1.00)
GFR, Estimated: >60 mL/min
Glucose, Bld: 89 mg/dL (ref 70–99)
Potassium: 3.9 mmol/L (ref 3.5–5.1)
Sodium: 139 mmol/L (ref 135–145)
Total Bilirubin: 0.3 mg/dL (ref 0.0–1.2)
Total Protein: 6.7 g/dL (ref 6.5–8.1)

## 2024-09-18 LAB — CBC WITH DIFFERENTIAL (CANCER CENTER ONLY)
Abs Immature Granulocytes: 0.03 10*3/uL (ref 0.00–0.07)
Basophils Absolute: 0.1 10*3/uL (ref 0.0–0.1)
Basophils Relative: 1 %
Eosinophils Absolute: 0.2 10*3/uL (ref 0.0–0.5)
Eosinophils Relative: 5 %
HCT: 34.8 % — ABNORMAL LOW (ref 36.0–46.0)
Hemoglobin: 11.5 g/dL — ABNORMAL LOW (ref 12.0–15.0)
Immature Granulocytes: 1 %
Lymphocytes Relative: 34 %
Lymphs Abs: 1.3 10*3/uL (ref 0.7–4.0)
MCH: 30 pg (ref 26.0–34.0)
MCHC: 33 g/dL (ref 30.0–36.0)
MCV: 90.9 fL (ref 80.0–100.0)
Monocytes Absolute: 0.2 10*3/uL (ref 0.1–1.0)
Monocytes Relative: 5 %
Neutro Abs: 2.1 10*3/uL (ref 1.7–7.7)
Neutrophils Relative %: 54 %
Platelet Count: 282 10*3/uL (ref 150–400)
RBC: 3.83 MIL/uL — ABNORMAL LOW (ref 3.87–5.11)
RDW: 12.6 % (ref 11.5–15.5)
WBC Count: 3.9 10*3/uL — ABNORMAL LOW (ref 4.0–10.5)
nRBC: 0 % (ref 0.0–0.2)

## 2024-09-18 MED ORDER — DEXAMETHASONE SOD PHOSPHATE PF 10 MG/ML IJ SOLN
10.0000 mg | Freq: Once | INTRAMUSCULAR | Status: AC
  Administered 2024-09-18: 10 mg via INTRAVENOUS
  Filled 2024-09-18: qty 1

## 2024-09-18 MED ORDER — SODIUM CHLORIDE 0.9 % IV SOLN
80.0000 mg/m2 | Freq: Once | INTRAVENOUS | Status: AC
  Administered 2024-09-18: 174 mg via INTRAVENOUS
  Filled 2024-09-18: qty 29

## 2024-09-18 MED ORDER — SODIUM CHLORIDE 0.9 % IV SOLN
INTRAVENOUS | Status: DC
  Filled 2024-09-18: qty 250

## 2024-09-18 MED ORDER — FAMOTIDINE IN NACL 20-0.9 MG/50ML-% IV SOLN
20.0000 mg | Freq: Once | INTRAVENOUS | Status: AC
  Administered 2024-09-18: 20 mg via INTRAVENOUS

## 2024-09-18 MED ORDER — PALONOSETRON HCL INJECTION 0.25 MG/5ML
0.2500 mg | Freq: Once | INTRAVENOUS | Status: AC
  Administered 2024-09-18: 0.25 mg via INTRAVENOUS
  Filled 2024-09-18: qty 5

## 2024-09-18 MED ORDER — DIPHENHYDRAMINE HCL 50 MG/ML IJ SOLN
50.0000 mg | Freq: Once | INTRAMUSCULAR | Status: AC
  Administered 2024-09-18: 50 mg via INTRAVENOUS
  Filled 2024-09-18: qty 1

## 2024-09-18 MED ORDER — SODIUM CHLORIDE 0.9 % IV SOLN
211.0500 mg | Freq: Once | INTRAVENOUS | Status: AC
  Administered 2024-09-18: 210 mg via INTRAVENOUS
  Filled 2024-09-18: qty 21

## 2024-09-18 NOTE — Progress Notes (Signed)
 " Hematology/Oncology Progress note [phone] [phone]         CHIEF COMPLAINTS/PURPOSE OF CONSULTATION:  Left breast triple negative cancer.   ASSESSMENT & PLAN:   Invasive ductal carcinoma of breast [facility]) Pathology and radiology counseling: Discussed with the patient, the details of pathology including the type of breast cancer,the clinical staging, the significance of ER, PR and HER-2/neu receptors and the implications for treatment. After reviewing the pathology in detail, we proceeded to discuss the different treatment options between surgery, radiation, chemotherapy, antiestrogen therapies.  cT1c cN0 left invasive ductal carcinoma, Triple negative Genetic counseling pending.  Recommend neoadjuvant chemotherapy followed by surgery radiation, adjuvant treatment.  She is not interested in clinical trial.  Recommend neoadjuvant carboplatin  Taxol  Keytruda  followed by [facility] + Keytruda .   Labs are reviewed and discussed with patient. Proceed with cycle 1 D8  carboplatin  Taxol      Encounter for antineoplastic chemotherapy Chemotherapy plan as listed above.  Family history of cancer Recommend genetic counseling.   No orders of the defined types were placed in this encounter.  Follow-up in 1 week. All questions were answered. The patient knows to call the clinic with any problems, questions or concerns.  Zelphia Cap, MD, PhD [facility] Hematology Oncology 09/18/2024    HISTORY OF PRESENTING ILLNESS:  Christina Holloway 64 y.o. female presents to establish care for left breast invasive ductal carcinoma, Triple negative.  I have reviewed her chart and materials related to her cancer extensively and collaborated history with the patient. Summary of oncologic history is as follows: Oncology History  Invasive ductal carcinoma of breast (HCC)  08/05/2024 Mammogram   Bilateral screening mammogram showed FINDINGS: In the left breast, a possible mass about the  upper-outer breast, middle to posterior third, warrants further evaluation. In the right breast, no findings suspicious for malignancy.   IMPRESSION: Further evaluation is suggested for possible mass in the left breast.   RECOMMENDATION: Ultrasound of the left breast. (Code:US -[id number])   08/08/2024 Imaging   Left breast ultrasound showed Near anechoic LEFT breast cyst versus mass, corresponding with the abnormality described at the time of screening. Ultrasound-guided aspiration with possible conversion to biopsy is recommended.  RECOMMENDATION: Ultrasound-guided cyst aspiration with possible conversion to biopsy of the LEFT breast x1   08/22/2024 Initial Diagnosis   Invasive ductal carcinoma of breast   1. Breast, left, needle core biopsy, 2 o'clock, 16cmfn, 11mm, ribbon clip :       INVASIVE DUCTAL CARCINOMA, SEE NOTE       TUBULE FORMATION: SCORE 3       NUCLEAR PLEOMORPHISM: SCORE 3       MITOTIC COUNT: SCORE 2       TOTAL SCORE: 8       OVERALL GRADE: 3       LYMPHOVASCULAR INVASION: NOT IDENTIFIED       CANCER LENGTH: 1.1 CM       CALCIFICATIONS: NOT IDENTIFIED       OTHER FINDINGS: NONE         ER -, PR- HER2 -   08/23/2024 Cancer Staging   Staging form: Breast, AJCC 8th Edition - Clinical stage from 08/23/2024: Stage IB (cT1c, cN0, cM0, G3, ER-, PR-, HER2-) - Signed by Cap Zelphia, MD on 08/29/2024 Stage prefix: Initial diagnosis Histologic grading system: 3 grade system   09/11/2024 -  Chemotherapy   Patient is on Treatment Plan : BREAST Pembrolizumab  (200) D1 + Carboplatin  (1.5) D1,8,15 + Paclitaxel  (80) D1,8,15 q21d X 4  cycles / Pembrolizumab  (200) D1 + AC D1 q21d x 4 cycles      Discussed the use of AI scribe software for clinical note transcription with the patient, who gave verbal consent to proceed.   Patient presented for evaluation prior to chemotherapy.   She reports increased fatigue and mild anemia. She denies nausea and vomiting. She experiences  intermittent numbness and tingling, which predated chemotherapy, and occasional cognitive impairment described as 'brain fog.'  Constipation has been persistent since initiation of chemotherapy, despite daily use of docusate 100 mg. She does not have daily bowel movements, and constipation may be exacerbated by antiemetic medications administered with chemotherapy.  She experiences transient anxiety for approximately one and a half days following chemotherapy, which she attributes to concerns about her thyroid nodules and the treatment process. She occasionally feels short of breath but does not believe this is related to her thyroid nodules.  She has multinodular goiter with two thyroid nodules identified on ultrasound. Baseline thyroid function testing revealed mildly decreased function, though T4 remains within normal limits. She expresses concern about the nodules and is open to endocrinology referral for further evaluation and annual ultrasound follow-up.  MEDICAL HISTORY:  Past Medical History:  Diagnosis Date   Abnormal LFTs 05/02/2015   Anemia, iron deficiency 08/24/2003   in past   Arthritis    lower back, legs   Calcium  blood increased 05/02/2015   Cancer (HCC)    Colon polyp    Decreased potassium in the blood 05/02/2015   Essential (primary) hypertension 08/24/2003   Gout 05/02/2015   History of alcohol abuse    History of primary malignant neoplasm of urinary bladder    Hypercholesterolemia without hypertriglyceridemia 04/29/2009   Invasive ductal carcinoma of breast (HCC)    Malaise and fatigue 05/02/2015   Obesity (BMI 30.0-34.9)    Pre-diabetes    Sciatica    Shortness of breath dyspnea    1 flight stairs   Wears dentures    full upper, partial lower    SURGICAL HISTORY: Past Surgical History:  Procedure Laterality Date   BREAST BIOPSY Left 08/14/2024   US  LT BREAST BX W LOC DEV 1ST LESION IMG BX SPEC US  GUIDE 08/14/2024 [facility]   COLONOSCOPY WITH  PROPOFOL  N/A 05/08/2015   Procedure: COLONOSCOPY WITH PROPOFOL ;  Surgeon: Rogelia Copping, MD;  Location: [facility];  Service: Endoscopy;  Laterality: N/A;   COLONOSCOPY WITH PROPOFOL  N/A 05/30/2018   Procedure: COLONOSCOPY WITH PROPOFOL ;  Surgeon: Unk Corinn Skiff, MD;  Location: [facility] ENDOSCOPY;  Service: Gastroenterology;  Laterality: N/A;   COLONOSCOPY WITH PROPOFOL  N/A 07/08/2023   Procedure: COLONOSCOPY WITH PROPOFOL ;  Surgeon: Unk Corinn Skiff, MD;  Location: [facility] ENDOSCOPY;  Service: Gastroenterology;  Laterality: N/A;   CYSTOSCOPY WITH FULGERATION  09/14/2019   Procedure: CYSTOSCOPY WITH FULGERATION;  Surgeon: Francisca Redell BROCKS, MD;  Location: [facility] ORS;  Service: Urology;;   ENDOMETRIAL ABLATION     POLYPECTOMY  05/08/2015   Procedure: POLYPECTOMY;  Surgeon: Rogelia Copping, MD;  Location: [facility];  Service: Endoscopy;;   POLYPECTOMY  07/08/2023   Procedure: POLYPECTOMY INTESTINAL;  Surgeon: Unk Corinn Skiff, MD;  Location: [facility] ENDOSCOPY;  Service: Gastroenterology;;   PORTACATH PLACEMENT Right 09/05/2024   Procedure: INSERTION, TUNNELED CENTRAL VENOUS DEVICE, WITH PORT;  Surgeon: Desiderio Schanz, MD;  Location: [facility] ORS;  Service: General;  Laterality: Right;   TRANSURETHRAL RESECTION OF BLADDER TUMOR WITH MITOMYCIN -C N/A 08/10/2019   Procedure: TRANSURETHRAL RESECTION OF BLADDER TUMOR WITH Gemcitabine ;  Surgeon:  Francisca Redell BROCKS, MD;  Location: [facility] ORS;  Service: Urology;  Laterality: N/A;   VAGINAL HYSTERECTOMY      SOCIAL HISTORY: Social History   Socioeconomic History   Marital status: Single    Spouse name: Not on file   Number of children: Not on file   Years of education: Not on file   Highest education level: Not on file  Occupational History   Not on file  Tobacco Use   Smoking status: Never    Passive exposure: Never   Smokeless tobacco: Never  Vaping Use   Vaping status: Never Used  Substance and Sexual Activity   Alcohol use: Yes     Alcohol/week: 36.0 standard drinks of alcohol    Types: 36 Cans of beer per week    Comment: socially   Drug use: No   Sexual activity: Yes    Birth control/protection: Surgical  Other Topics Concern   Not on file  Social History Narrative   Not on file   Social Drivers of Health   Tobacco Use: Low Risk (09/18/2024)   Patient History    Smoking Tobacco Use: Never    Smokeless Tobacco Use: Never    Passive Exposure: Never  Financial Resource Strain: Not on file  Food Insecurity: No Food Insecurity (08/22/2024)   Epic    Worried About Programme Researcher, Broadcasting/film/video in the Last Year: Never true    Ran Out of Food in the Last Year: Never true  Transportation Needs: No Transportation Needs (08/22/2024)   Epic    Lack of Transportation (Medical): No    Lack of Transportation (Non-Medical): No  Physical Activity: Not on file  Stress: Not on file  Social Connections: Not on file  Intimate Partner Violence: Not At Risk (08/22/2024)   Epic    Fear of Current or Ex-Partner: No    Emotionally Abused: No    Physically Abused: No    Sexually Abused: No  Depression (PHQ2-9): Low Risk (09/11/2024)   Depression (PHQ2-9)    PHQ-2 Score: 0  Alcohol Screen: Low Risk (06/03/2022)   Alcohol Screen    Last Alcohol Screening Score (AUDIT): 5  Housing: Low Risk (08/22/2024)   Epic    Unable to Pay for Housing in the Last Year: No    Number of Times Moved in the Last Year: 1    Homeless in the Last Year: No  Utilities: Not At Risk (08/22/2024)   Epic    Threatened with loss of utilities: No  Health Literacy: Not on file    FAMILY HISTORY: Family History  Problem Relation Age of Onset   Hypertension Mother    Thyroid disease Mother    Prostate cancer Father 37       metastatic   AAA (abdominal aortic aneurysm) Sister    Ovarian cancer Maternal Aunt        80s   Lung cancer Maternal Uncle    Colon cancer Maternal Grandmother 71   Breast cancer Paternal Grandmother 19   Breast cancer Cousin  52   Lung cancer Cousin    Pancreatic cancer Cousin    Kidney cancer Cousin    Breast cancer Cousin    Kidney cancer Cousin    Breast cancer Cousin        d. under 40   Kidney cancer Cousin     ALLERGIES:  is allergic to penicillins.  MEDICATIONS:  Current Outpatient Medications  Medication Sig Dispense Refill   acetaminophen  (TYLENOL ) 500  MG tablet Take 2 tablets (1,000 mg total) by mouth every 6 (six) hours as needed for mild pain (pain score 1-3).     amLODipine  (NORVASC ) 10 MG tablet Take 1 tablet (10 mg total) by mouth daily. (Patient taking differently: Take 10 mg by mouth every morning.) 90 tablet 2   cetirizine  (ZYRTEC ) 10 MG tablet Take 1 tablet (10 mg total) by mouth daily. 90 tablet 3   colchicine  0.6 MG tablet TAKE 2 TABLETS BY MOUTH AT FIRST SIGN OF GOUT FLARE. THEN TAKE 1 TABLET 1 HOURLATER. CONTINUE 1 TABLET TWICE A DAY UNTIL 48 HOURS AFTER RESOLUTION OF FLARE 30 tablet 1   cyclobenzaprine  (FLEXERIL ) 5 MG tablet Take 1 tablet (5 mg total) by mouth 3 (three) times daily as needed for muscle spasms. 30 tablet 0   dexamethasone  (DECADRON ) 4 MG tablet Take 2 tablets daily for 2 days, start the day after chemotherapy. Take with food. 30 tablet 1   docusate sodium  (COLACE) 100 MG capsule Take 100 mg by mouth daily.     fluticasone  (FLONASE ) 50 MCG/ACT nasal spray Place 2 sprays into both nostrils daily. 16 g 6   hydrochlorothiazide  (HYDRODIURIL ) 25 MG tablet Take 1 tablet (25 mg total) by mouth daily. 90 tablet 2   HYDROcodone -acetaminophen  (NORCO/VICODIN) 5-325 MG tablet Take 1 tablet by mouth every 6 (six) hours as needed for moderate pain (pain score 4-6). 20 tablet 0   ibuprofen  (ADVIL ) 600 MG tablet Take 1 tablet (600 mg total) by mouth every 8 (eight) hours as needed for moderate pain (pain score 4-6). 60 tablet 0   lidocaine -prilocaine  (EMLA ) cream Apply to affected area once 30 g 3   losartan  (COZAAR ) 100 MG tablet Take 1 tablet (100 mg total) by mouth daily. 90 tablet 2    naproxen  (NAPROSYN ) 500 MG tablet TAKE 1 TABLET BY MOUTH TWICE A DAY WITH MEALS 60 tablet 1   ondansetron  (ZOFRAN ) 8 MG tablet Take 1 tablet (8 mg total) by mouth every 8 (eight) hours as needed for nausea or vomiting. Start on the third day after chemotherapy. 30 tablet 1   oxyCODONE  (OXY IR/ROXICODONE ) 5 MG immediate release tablet Take 1 tablet (5 mg total) by mouth every 6 (six) hours as needed for severe pain (pain score 7-10). 10 tablet 0   prochlorperazine  (COMPAZINE ) 10 MG tablet Take 1 tablet (10 mg total) by mouth every 6 (six) hours as needed for nausea or vomiting. 30 tablet 1   rosuvastatin  (CRESTOR ) 5 MG tablet Take 1 tablet (5 mg total) by mouth daily. 90 tablet 2   CALCIUM  PO Take 1 tablet by mouth daily. (Patient not taking: Reported on 09/18/2024)     No current facility-administered medications for this visit.    Review of Systems  Constitutional:  Negative for appetite change, chills, fatigue and fever.  HENT:   Negative for hearing loss and voice change.   Eyes:  Negative for eye problems.  Respiratory:  Positive for cough. Negative for chest tightness, hemoptysis and shortness of breath.   Cardiovascular:  Negative for chest pain.  Gastrointestinal:  Negative for abdominal distention, abdominal pain and blood in stool.  Endocrine: Negative for hot flashes.  Genitourinary:  Negative for difficulty urinating and frequency.   Musculoskeletal:  Negative for arthralgias.  Skin:  Negative for itching and rash.  Neurological:  Negative for extremity weakness.  Hematological:  Negative for adenopathy.  Psychiatric/Behavioral:  Negative for confusion.      PHYSICAL EXAMINATION: ECOG PERFORMANCE STATUS: 0 -  Asymptomatic  Vitals:   09/18/24 1027  BP: 134/89  Pulse: 89  Resp: 19  Temp: 97.6 F (36.4 C)  SpO2: 99%   Filed Weights   09/18/24 1027  Weight: 222 lb 9.6 oz (101 kg)    Physical Exam Constitutional:      General: She is not in acute distress.     Appearance: She is not diaphoretic.  HENT:     Head: Normocephalic and atraumatic.  Eyes:     General: No scleral icterus. Cardiovascular:     Rate and Rhythm: Normal rate and regular rhythm.  Pulmonary:     Effort: Pulmonary effort is normal. No respiratory distress.     Breath sounds: No wheezing.  Abdominal:     General: There is no distension.     Palpations: Abdomen is soft.     Tenderness: There is no abdominal tenderness.  Musculoskeletal:        General: Normal range of motion.     Cervical back: Normal range of motion and neck supple.  Skin:    General: Skin is warm and dry.     Findings: No erythema.  Neurological:     Mental Status: She is alert and oriented to person, place, and time. Mental status is at baseline.     Motor: No abnormal muscle tone.  Psychiatric:        Mood and Affect: Mood and affect normal.     LABORATORY DATA:  I have reviewed the data as listed    Latest Ref Rng & Units 09/18/2024    9:57 AM 09/11/2024    9:31 AM 08/22/2024   12:16 PM  CBC  WBC 4.0 - 10.5 K/uL 3.9  6.4  7.3   Hemoglobin 12.0 - 15.0 g/dL 88.4  87.8  87.1   Hematocrit 36.0 - 46.0 % 34.8  35.9  38.1   Platelets 150 - 400 K/uL 282  342  331       Latest Ref Rng & Units 09/18/2024    9:57 AM 09/11/2024    9:40 AM 09/04/2024    8:07 AM  CMP  Glucose 70 - 99 mg/dL 89  95  98   BUN 8 - 23 mg/dL 12  17  14    Creatinine 0.44 - 1.00 mg/dL 9.26  9.24  9.21   Sodium 135 - 145 mmol/L 139  140  140   Potassium 3.5 - 5.1 mmol/L 3.9  3.5  3.4   Chloride 98 - 111 mmol/L 102  103  101   CO2 22 - 32 mmol/L 27  25  27    Calcium  8.9 - 10.3 mg/dL 89.9  89.5  89.3   Total Protein 6.5 - 8.1 g/dL 6.7  7.4    Total Bilirubin 0.0 - 1.2 mg/dL 0.3  0.3    Alkaline Phos 38 - 126 U/L 65  71    AST 15 - 41 U/L 24  23    ALT 0 - 44 U/L 38  30       RADIOGRAPHIC STUDIES: I have personally reviewed the radiological images as listed and agreed with the findings in the report. US   THYROID Result Date: 09/17/2024 CLINICAL DATA:  Incidental on CT.  thyroid nodule EXAM: THYROID ULTRASOUND TECHNIQUE: Ultrasound examination of the thyroid gland and adjacent soft tissues was performed. COMPARISON:  CT CHEST, 09/05/2024. FINDINGS: Parenchymal Echotexture: Mildly heterogenous Isthmus: 0.3 cm Right lobe: 6.7 x 2.0 x 2.1 cm Left lobe: 5.2 x 2.3 x 2.5  cm _________________________________________________________ Estimated total number of nodules >/= 1 cm: 3 Number of spongiform nodules >/=  2 cm not described below (TR1): 0 Number of mixed cystic and solid nodules >/= 1.5 cm not described below (TR2): 0 _________________________________________________________ Nodule # 1: Location: RIGHT; Superior Maximum size: 1.2 cm; Other 2 dimensions: 1.0 x 0.5 cm Composition: solid/almost completely solid (2), suspect spongiform Echogenicity: hypoechoic (2) Shape: not taller-than-wide (0) Margins: ill-defined (0) Echogenic foci: none (0) ACR TI-RADS total points: 4. ACR TI-RADS risk category: TR4 (4-6 points). ACR TI-RADS recommendations: *Given size (>/= 1 - 1.4 cm) and appearance, a follow-up ultrasound in 1 year should be considered based on TI-RADS criteria. _________________________________________________________ Nodule # 3: Location: LEFT; Mid Maximum size: 1.8 cm; Other 2 dimensions: 1.8 x 1.8 cm Composition: solid/almost completely solid (2) Echogenicity: isoechoic (1) Shape: not taller-than-wide (0) Margins: ill-defined (0) Echogenic foci: none (0) ACR TI-RADS total points: 3. ACR TI-RADS risk category: TR3 (3 points). ACR TI-RADS recommendations: *Given size (>/= 1.5 - 2.4 cm) and appearance, a follow-up ultrasound in 1 year should be considered based on TI-RADS criteria. _________________________________________________________ Additional nodules, such as labeled #2 (1.2 cm RIGHT mid spongiform, TR-2) and additional sub-1 cm solid and cystic nodules scattered within the gland do not meet threshold for  follow-up nor biopsy per current criteria. No cervical adenopathy or abnormal fluid collection within the imaged neck. IMPRESSION: 1. Enlarged heterogeneous and multinodular thyroid consistent with a goiter. 2. 1.2 cm RIGHT superior TR-4 and 1.8 cm LEFT mid TR-3 thyroid nodules. A follow-up ultrasound in 1 year should be considered based on TI-RADS criteria. The above is in keeping with the ACR TI-RADS recommendations - J Am Coll Radiol 2017;14:587-595. Electronically Signed   By: Thom Hall M.D.   On: 09/17/2024 11:11   CT Chest Wo Contrast Result Date: 09/10/2024 CLINICAL DATA:  History breast cancer and bladder cancer. Dry cough. EXAM: CT CHEST WITHOUT CONTRAST TECHNIQUE: Multidetector CT imaging of the chest was performed following the standard protocol without IV contrast. RADIATION DOSE REDUCTION: This exam was performed according to the departmental dose-optimization program which includes automated exposure control, adjustment of the mA and/or kV according to patient size and/or use of iterative reconstruction technique. COMPARISON:  Prior chest CT 02/24/2008 and prior CT abdomen/pelvis 08/10/2024 FINDINGS: Cardiovascular: The heart is normal in size. No pericardial effusion. The aorta is normal in caliber. Minimal calcifications at the aortic arch. Stable scattered coronary artery calcifications. Mediastinum/Nodes: No mediastinal or hilar mass or lymphadenopathy. The esophagus is unremarkable. There is a 2.2 cm left thyroid nodule. Recommend thyroid US  (ref: J Am Coll Radiol. 2015 Feb;12(2): 143-50). Lungs/Pleura: No acute pulmonary process. No infiltrates, edema or effusions. No pulmonary lesions or nodules. Subpleural atelectasis noted adjacent to large right-sided vertebral body spurs. The tracheobronchial tree is unremarkable. No findings suspicious for bronchitis or endobronchial lesion. Upper Abdomen: No significant upper abdominal findings. Partially rim calcified gallstone noted in the fundal  region of the gallbladder measuring 17 mm. No hepatic or adrenal gland lesions. No upper abdominal adenopathy. Musculoskeletal: No breast masses, supraclavicular or axillary adenopathy. The bony thorax is intact. No lytic or sclerotic bone lesions. Age advanced degenerative spurring changes involving the thoracic spine. IMPRESSION: 1. No acute pulmonary findings or worrisome pulmonary lesions. 2. No mediastinal or hilar mass or adenopathy. 3. 2.2 cm left thyroid nodule. Recommend follow-up thyroid ultrasound examination. 4. Cholelithiasis. Electronically Signed   By: MYRTIS Stammer M.D.   On: 09/10/2024 14:54   DG CHEST PORT 1  VIEW Result Date: 09/05/2024 CLINICAL DATA:  Port-A-Cath placement EXAM: PORTABLE CHEST 1 VIEW COMPARISON:  August 08, 2019 FINDINGS: Stable cardiomediastinal silhouette. Interval placement of right internal jugular Port-A-Cath with distal tip in SVC. No pneumothorax. Hypoinflation of the lungs with minimal bibasilar subsegmental atelectasis. IMPRESSION: Interval placement of right internal jugular Port-A-Cath with distal tip in SVC. No pneumothorax. Electronically Signed   By: Lynwood Landy Raddle M.D.   On: 09/05/2024 15:10   DG C-Arm 1-60 Min-No Report Result Date: 09/05/2024 Fluoroscopy was utilized by the requesting physician.  No radiographic interpretation.   MM 3D DIAGNOSTIC MAMMOGRAM UNILATERAL LEFT BREAST Result Date: 08/27/2024 CLINICAL DATA:  History of recently diagnosed LEFT breast cancer. Oncologist desires axillary evaluation and dedicated diagnostic views of the LEFT breast. EXAM: DIGITAL DIAGNOSTIC UNILATERAL LEFT MAMMOGRAM WITH TOMOSYNTHESIS AND CAD; ULTRASOUND LEFT BREAST LIMITED TECHNIQUE: Left digital diagnostic mammography and breast tomosynthesis was performed. The images were evaluated with computer-aided detection. ; Targeted ultrasound examination of the left breast was performed. COMPARISON:  Previous exam(s). ACR Breast Density Category b: There are scattered  areas of fibroglandular density. FINDINGS: Redemonstrated oval circumscribed mass in the upper-outer quadrant of the LEFT breast containing a ribbon biopsy marking clip measuring approximately 11 mm, correlating with biopsy-proven LEFT breast cancer. Post biopsy changes are noted in the surrounding breast. A stable LEFT breast mass is noted in the lower outer quadrant which has been present for many years and is considered benign. Normal-appearing LEFT axillary lymph nodes are visualized. Targeted LEFT breast ultrasound was performed. At the LEFT breast 2 o'clock position 16 cm from the nipple there is an oval, near anechoic mass with angular margins measuring 12 x 6 x 11 mm, corresponding with biopsy-proven LEFT breast cancer. Allowing for post biopsy changes, it is not significantly changed in size from prior exam. A biopsy clip is noted within the mass. Complete LEFT axillary ultrasound was performed without suspicious lymphadenopathy. Representative pictures were taken. IMPRESSION: 1.  No LEFT axillary lymphadenopathy seen. 2. No significant interval change in size of biopsy-proven LEFT breast cancer, today measuring 12 x 6 x 11 mm. RECOMMENDATION: Continued oncologic treatment plan for known biopsy-proven LEFT breast malignancy. I have discussed the findings and recommendations with the patient. If applicable, a reminder letter will be sent to the patient regarding the next appointment. BI-RADS CATEGORY  6: Known biopsy-proven malignancy. Electronically Signed   By: Norleen Croak M.D.   On: 08/27/2024 16:21   US  LIMITED ULTRASOUND INCLUDING AXILLA LEFT BREAST  Result Date: 08/27/2024 CLINICAL DATA:  History of recently diagnosed LEFT breast cancer. Oncologist desires axillary evaluation and dedicated diagnostic views of the LEFT breast. EXAM: DIGITAL DIAGNOSTIC UNILATERAL LEFT MAMMOGRAM WITH TOMOSYNTHESIS AND CAD; ULTRASOUND LEFT BREAST LIMITED TECHNIQUE: Left digital diagnostic mammography and breast  tomosynthesis was performed. The images were evaluated with computer-aided detection. ; Targeted ultrasound examination of the left breast was performed. COMPARISON:  Previous exam(s). ACR Breast Density Category b: There are scattered areas of fibroglandular density. FINDINGS: Redemonstrated oval circumscribed mass in the upper-outer quadrant of the LEFT breast containing a ribbon biopsy marking clip measuring approximately 11 mm, correlating with biopsy-proven LEFT breast cancer. Post biopsy changes are noted in the surrounding breast. A stable LEFT breast mass is noted in the lower outer quadrant which has been present for many years and is considered benign. Normal-appearing LEFT axillary lymph nodes are visualized. Targeted LEFT breast ultrasound was performed. At the LEFT breast 2 o'clock position 16 cm from the nipple there is  an oval, near anechoic mass with angular margins measuring 12 x 6 x 11 mm, corresponding with biopsy-proven LEFT breast cancer. Allowing for post biopsy changes, it is not significantly changed in size from prior exam. A biopsy clip is noted within the mass. Complete LEFT axillary ultrasound was performed without suspicious lymphadenopathy. Representative pictures were taken. IMPRESSION: 1.  No LEFT axillary lymphadenopathy seen. 2. No significant interval change in size of biopsy-proven LEFT breast cancer, today measuring 12 x 6 x 11 mm. RECOMMENDATION: Continued oncologic treatment plan for known biopsy-proven LEFT breast malignancy. I have discussed the findings and recommendations with the patient. If applicable, a reminder letter will be sent to the patient regarding the next appointment. BI-RADS CATEGORY  6: Known biopsy-proven malignancy. Electronically Signed   By: Norleen Croak M.D.   On: 08/27/2024 16:21    "

## 2024-09-18 NOTE — Assessment & Plan Note (Signed)
Recommend genetic counseling. 

## 2024-09-18 NOTE — Assessment & Plan Note (Addendum)
 Pathology and radiology counseling: Discussed with the patient, the details of pathology including the type of breast cancer,the clinical staging, the significance of ER, PR and HER-2/neu receptors and the implications for treatment. After reviewing the pathology in detail, we proceeded to discuss the different treatment options between surgery, radiation, chemotherapy, antiestrogen therapies.  cT1c cN0 left invasive ductal carcinoma, Triple negative Genetic counseling pending.  Recommend neoadjuvant chemotherapy followed by surgery radiation, adjuvant treatment.  She is not interested in clinical trial.  Recommend neoadjuvant carboplatin  Taxol  Keytruda  followed by [facility] + Keytruda .   Labs are reviewed and discussed with patient. Proceed with cycle 1 D8  carboplatin  Taxol 

## 2024-09-18 NOTE — Assessment & Plan Note (Signed)
-   Advised to increase Colace to 100 mg twice daily. - Instructed to monitor bowel movements and escalate therapy if constipation persists.

## 2024-09-18 NOTE — Assessment & Plan Note (Signed)
 Chemotherapy plan as listed above

## 2024-09-19 ENCOUNTER — Telehealth: Payer: Self-pay

## 2024-09-19 NOTE — Telephone Encounter (Signed)
 Referral faxed to [facility] endocrinology. Re: thyroid nodule, multinodular goiter. Fax confirmation received.

## 2024-09-20 ENCOUNTER — Other Ambulatory Visit: Payer: Self-pay

## 2024-09-20 ENCOUNTER — Telehealth: Payer: Self-pay | Admitting: Family Medicine

## 2024-09-20 DIAGNOSIS — I10 Essential (primary) hypertension: Secondary | ICD-10-CM

## 2024-09-20 MED ORDER — HYDROCHLOROTHIAZIDE 25 MG PO TABS: 25.0000 mg | ORAL_TABLET | Freq: Every day | ORAL | 0 refills | Status: AC

## 2024-09-20 MED ORDER — AMLODIPINE BESYLATE 10 MG PO TABS: 10.0000 mg | ORAL_TABLET | Freq: Every day | ORAL | 0 refills | Status: AC

## 2024-09-20 MED ORDER — LOSARTAN POTASSIUM 100 MG PO TABS: 100.0000 mg | ORAL_TABLET | Freq: Every day | ORAL | 0 refills | Status: AC

## 2024-09-20 NOTE — Telephone Encounter (Signed)
 Medication has been sent for 90 day supply

## 2024-09-20 NOTE — Telephone Encounter (Signed)
 [facility] faxed refill request for the following medications:  hydrochlorothiazide  (HYDRODIURIL ) 25 MG tablet   losartan  (COZAAR ) 100 MG tablet   amLODipine  (NORVASC ) 10 MG tablet      Please advise.

## 2024-09-21 ENCOUNTER — Encounter: Payer: Self-pay | Admitting: Surgery

## 2024-09-21 ENCOUNTER — Ambulatory Visit: Admitting: Surgery

## 2024-09-21 ENCOUNTER — Other Ambulatory Visit: Payer: Self-pay

## 2024-09-21 VITALS — BP 120/77 | HR 84 | Temp 98.0°F | Wt 218.8 lb

## 2024-09-21 DIAGNOSIS — C50912 Malignant neoplasm of unspecified site of left female breast: Secondary | ICD-10-CM

## 2024-09-21 DIAGNOSIS — Z452 Encounter for adjustment and management of vascular access device: Secondary | ICD-10-CM | POA: Diagnosis not present

## 2024-09-21 DIAGNOSIS — Z95828 Presence of other vascular implants and grafts: Secondary | ICD-10-CM

## 2024-09-21 DIAGNOSIS — C50412 Malignant neoplasm of upper-outer quadrant of left female breast: Secondary | ICD-10-CM

## 2024-09-21 MED ORDER — LIDOCAINE-PRILOCAINE 2.5-2.5 % EX CREA: TOPICAL_CREAM | CUTANEOUS | 3 refills | Status: AC

## 2024-09-21 NOTE — Patient Instructions (Signed)
 Central Line in Adults: What to Expect A central line is a soft tube that is put into a vein so that it goes to a large vein above your heart. It can be used to: Give you medicine or fluids. Give you nutrients. Take blood or give you blood for testing or treatments. Give chemotherapy or dialysis. Provide IV access if veins in your hands or arms are difficult to use. Types of central lines There are four main types of central lines: Peripherally inserted central catheter (PICC) line. This type is usually put in the upper arm and goes up the arm to the vein above your heart. This is used for one week or longer. Tunneled central line. This type is placed in a large vein in the neck, chest, or groin. It is tunneled under the skin and brought out through a small cut in your skin. Non-tunneled central line. This type is used for a shorter time than other types, usually for 7 days at the most. It is put in the neck, chest, or groin. Implanted port. This type can stay in place longer than other types of central lines. It is normally put in the upper chest but can also be placed in the upper arm or the belly. Surgery is needed to put it in and take it out. The type of central line you get will depend on how long you need it and your medical condition. Tell a doctor about: Any allergies you have. All medicines you take. These include vitamins, herbs, eye drops, and creams. Any problems you or family members have had with anesthesia. Any bleeding problems you have. Any surgeries you have had. Any medical conditions you have. Whether you're pregnant or may be pregnant. What are the risks? Your doctor will talk with you about risks. These may include: Infection. A blood clot. Bleeding. Getting a hole or crack in the central line. If this happens, the central line will need to be replaced. The catheter moving or coming out of place. A collapsed lung. Damage to other structures or  organs. What happens before the procedure? Medicines Ask about changing or stopping: Any medicines you take. Any vitamins, herbs, or supplements you take. Do not take aspirin or ibuprofen unless you're told to. Surgery safety For your safety, you may: Need to wash your skin with a soap that kills germs. Get antibiotics. Have your procedure site marked. Have hair removed at the procedure site. General instructions Eat and drink as told. Plan to have a responsible adult take you home from the hospital or clinic. Ask if you'll be staying overnight in the hospital. If you'll be going home right after the procedure, plan to have a responsible adult: Drive you home from the hospital or clinic. You won't be allowed to drive. Stay with you for the time you're told. What happens during the procedure? An IV will be put into one of your veins. You may be given: A sedative to help you relax. Anesthesia to keep you from feeling pain. Your skin will be cleaned with a soap that kills germs, and you may be covered with clean sheet called a drape. Your blood pressure, heart rate, breathing rate, and blood oxygen level will be checked during the procedure. The central line will be put into the vein and moved through it to the correct spot. The doctor may use X-ray equipment to help guide the central line to the right place. A bandage will be placed over the insertion area.  These steps may vary. Ask what you can expect. What can I expect after the procedure? You will watched closely until you leave. This includes checking your pain level, blood pressure, heart rate, and breathing rate. Caps may be put on the ends of the central line tubes. If you were given a sedative, do not drive or use machines until you're told it's safe. A sedative can make you sleepy. Follow these instructions at home: Caring for the tube  Follow instructions from your doctor about: Flushing the tube. Cleaning the tube and  the area around it. Only use germ-free, also called sterile, supplies to flush the central line. The supplies should be from your doctor, a pharmacy, or another place that your doctor recommends. Before you flush the tube or clean the area around it: Wash your hands with soap and water for at least 20 seconds. If you can't use soap and water, use hand sanitizer. Clean the central line hub with rubbing alcohol. To do this: Clean the central line hub with a new alcohol wipe. Scrub it using a twisting motion and rub for 10 to 15 seconds or for 30 twists. Follow the manufacturer's instructions. Be sure you scrub the top of the hub, not just the sides. Let the hub dry before use. Keep it from touching anything while drying. Do not re-use alcohol pads. Caring for your skin Check the skin around the central line every day for signs of infection. Check for: Redness, swelling, or pain. Fluid or blood. Warmth. Pus or a bad smell. Keep the area where the tube was put in clean and dry. Change bandages only as told by your doctor. Keep your bandage dry. If a bandage gets wet, have it changed right away. Storing and throwing away supplies Keep your supplies in a clean, dry location. Throw away any used syringes in a container that is only for sharp items, called a sharps container. You can buy a sharps container from a pharmacy, or you can make one by using an empty hard plastic bottle with a cover. Place any used bandages or infusion bags into a plastic bag. Throw that bag in the trash. General instructions Follow instructions from your doctor for the type of device that you have. Keep the tube clamped unless it's being used. If you or someone else accidentally pulls on the tube, make sure: The bandage is OK. There is no bleeding. The tube has not been pulled out. Do not use scissors or sharp objects near the tube. Do not take baths, swim, or use a hot tub until you're told it's OK. Ask if you can  shower. Ask your doctor what activities are safe for you. Your doctor may tell you not to lift anything or move your arm too much on the side of your central line. Contact a doctor if: You have signs of an infection where the tube was put in. The skin is irritated near the bandage. You catheter appears to be getting longer. This may mean it is coming out of the vein. Get help right away if: You have: A fever or chills. Shortness of breath. Pain in your chest. A fast heartbeat. Swelling in your neck, face, chest, or arm. You feel dizzy or you faint. There are red lines coming from where the tube was put in. The area where the tube was put in is bleeding and the bleeding won't stop. Your tube is hard to flush. You do not get a blood return from the  tube. The tube gets loose or comes out. The tube has a hole or a tear. The tube leaks. This information is not intended to replace advice given to you by your health care provider. Make sure you discuss any questions you have with your health care provider. Document Revised: 03/07/2023 Document Reviewed: 03/07/2023 Elsevier Patient Education  2024 [employer].

## 2024-09-21 NOTE — Progress Notes (Signed)
 09/21/2024  HPI: Christina Holloway is a 64 y.o. female s/p right IJ Port-A-Cath placement on 09/05/2024.  Patient presents today for follow-up.  Reports that she has been doing well denies any troubles with the port.  She started chemotherapy already for her breast cancer.  She is tolerating well.  Vital signs: BP 120/77   Pulse 84   Temp 98 F (36.7 C) (Oral)   Wt 218 lb 12.8 oz (99.2 kg)   SpO2 96%   BMI 33.76 kg/m    Physical Exam: Constitutional: No acute distress Skin: Right upper chest incision is healing well and is clean, dry, intact with some mild degree of ecchymosis superior to the incision.  IJ access site also well-healed without any evidence of hematoma.  Assessment/Plan: This is a 64 y.o. female s/p right IJ Port-A-Cath placement.  - Patient is recovering well from her Port-A-Cath placement.  No evidence of complications or issues with the port.  The patient will continue chemotherapy and we will see her back after treatment has been completed so that we can discuss surgery in the future.  Discussed with her briefly that most likely will be doing a lumpectomy and sentinel lymph node biopsy. - Follow-up after chemotherapy.   Aloysius Sheree Plant, MD [facility]

## 2024-09-23 ENCOUNTER — Telehealth: Payer: Self-pay | Admitting: Oncology

## 2024-09-23 NOTE — Telephone Encounter (Signed)
 Due to weather, clinic is on delayed start time on 2/3. I called and spoke with pt and confirmed new start time

## 2024-09-25 ENCOUNTER — Telehealth: Payer: Self-pay | Admitting: Family Medicine

## 2024-09-25 ENCOUNTER — Encounter: Payer: Self-pay | Admitting: Licensed Clinical Social Worker

## 2024-09-25 ENCOUNTER — Inpatient Hospital Stay: Admitting: Oncology

## 2024-09-25 ENCOUNTER — Inpatient Hospital Stay

## 2024-09-25 ENCOUNTER — Telehealth: Payer: Self-pay | Admitting: Licensed Clinical Social Worker

## 2024-09-25 ENCOUNTER — Encounter: Payer: Self-pay | Admitting: Oncology

## 2024-09-25 ENCOUNTER — Inpatient Hospital Stay: Attending: Oncology

## 2024-09-25 ENCOUNTER — Ambulatory Visit: Payer: Self-pay | Admitting: Licensed Clinical Social Worker

## 2024-09-25 VITALS — BP 120/79 | HR 89 | Temp 98.1°F | Resp 18 | Wt 222.2 lb

## 2024-09-25 DIAGNOSIS — Z809 Family history of malignant neoplasm, unspecified: Secondary | ICD-10-CM

## 2024-09-25 DIAGNOSIS — Z5111 Encounter for antineoplastic chemotherapy: Secondary | ICD-10-CM

## 2024-09-25 DIAGNOSIS — C50912 Malignant neoplasm of unspecified site of left female breast: Secondary | ICD-10-CM

## 2024-09-25 DIAGNOSIS — Z8551 Personal history of malignant neoplasm of bladder: Secondary | ICD-10-CM

## 2024-09-25 DIAGNOSIS — Z1379 Encounter for other screening for genetic and chromosomal anomalies: Secondary | ICD-10-CM

## 2024-09-25 DIAGNOSIS — E041 Nontoxic single thyroid nodule: Secondary | ICD-10-CM | POA: Diagnosis not present

## 2024-09-25 LAB — CBC WITH DIFFERENTIAL (CANCER CENTER ONLY)
Abs Immature Granulocytes: 0.01 10*3/uL (ref 0.00–0.07)
Basophils Absolute: 0.1 10*3/uL (ref 0.0–0.1)
Basophils Relative: 1 %
Eosinophils Absolute: 0.2 10*3/uL (ref 0.0–0.5)
Eosinophils Relative: 4 %
HCT: 33.7 % — ABNORMAL LOW (ref 36.0–46.0)
Hemoglobin: 11.5 g/dL — ABNORMAL LOW (ref 12.0–15.0)
Immature Granulocytes: 0 %
Lymphocytes Relative: 38 %
Lymphs Abs: 1.5 10*3/uL (ref 0.7–4.0)
MCH: 30.7 pg (ref 26.0–34.0)
MCHC: 34.1 g/dL (ref 30.0–36.0)
MCV: 90.1 fL (ref 80.0–100.0)
Monocytes Absolute: 0.1 10*3/uL (ref 0.1–1.0)
Monocytes Relative: 4 %
Neutro Abs: 2.2 10*3/uL (ref 1.7–7.7)
Neutrophils Relative %: 53 %
Platelet Count: 304 10*3/uL (ref 150–400)
RBC: 3.74 MIL/uL — ABNORMAL LOW (ref 3.87–5.11)
RDW: 12.7 % (ref 11.5–15.5)
WBC Count: 4 10*3/uL (ref 4.0–10.5)
nRBC: 0 % (ref 0.0–0.2)

## 2024-09-25 LAB — CMP (CANCER CENTER ONLY)
ALT: 31 U/L (ref 0–44)
AST: 20 U/L (ref 15–41)
Albumin: 4.3 g/dL (ref 3.5–5.0)
Alkaline Phosphatase: 64 U/L (ref 38–126)
Anion gap: 12 (ref 5–15)
BUN: 10 mg/dL (ref 8–23)
CO2: 24 mmol/L (ref 22–32)
Calcium: 10.4 mg/dL — ABNORMAL HIGH (ref 8.9–10.3)
Chloride: 102 mmol/L (ref 98–111)
Creatinine: 0.7 mg/dL (ref 0.44–1.00)
GFR, Estimated: >60 mL/min
Glucose, Bld: 115 mg/dL — ABNORMAL HIGH (ref 70–99)
Potassium: 3.6 mmol/L (ref 3.5–5.1)
Sodium: 138 mmol/L (ref 135–145)
Total Bilirubin: 0.2 mg/dL (ref 0.0–1.2)
Total Protein: 6.9 g/dL (ref 6.5–8.1)

## 2024-09-25 MED ORDER — DIPHENHYDRAMINE HCL 50 MG/ML IJ SOLN
50.0000 mg | Freq: Once | INTRAMUSCULAR | Status: AC
  Administered 2024-09-25: 50 mg via INTRAVENOUS
  Filled 2024-09-25: qty 1

## 2024-09-25 MED ORDER — PALONOSETRON HCL INJECTION 0.25 MG/5ML
0.2500 mg | Freq: Once | INTRAVENOUS | Status: AC
  Administered 2024-09-25: 0.25 mg via INTRAVENOUS
  Filled 2024-09-25: qty 5

## 2024-09-25 MED ORDER — FAMOTIDINE IN NACL 20-0.9 MG/50ML-% IV SOLN
20.0000 mg | Freq: Once | INTRAVENOUS | Status: AC
  Administered 2024-09-25: 20 mg via INTRAVENOUS
  Filled 2024-09-25: qty 50

## 2024-09-25 MED ORDER — SODIUM CHLORIDE 0.9 % IV SOLN
211.0500 mg | Freq: Once | INTRAVENOUS | Status: AC
  Administered 2024-09-25: 210 mg via INTRAVENOUS
  Filled 2024-09-25: qty 21

## 2024-09-25 MED ORDER — SODIUM CHLORIDE 0.9 % IV SOLN
INTRAVENOUS | Status: DC
  Filled 2024-09-25: qty 250

## 2024-09-25 MED ORDER — SODIUM CHLORIDE 0.9 % IV SOLN
80.0000 mg/m2 | Freq: Once | INTRAVENOUS | Status: AC
  Administered 2024-09-25: 174 mg via INTRAVENOUS
  Filled 2024-09-25: qty 29

## 2024-09-25 MED ORDER — DEXAMETHASONE SOD PHOSPHATE PF 10 MG/ML IJ SOLN
10.0000 mg | Freq: Once | INTRAMUSCULAR | Status: AC
  Administered 2024-09-25: 10 mg via INTRAVENOUS
  Filled 2024-09-25: qty 1

## 2024-09-25 NOTE — Assessment & Plan Note (Signed)
 Possibly due to  hydrochlorothiazide  Normal PTH, PTHrp

## 2024-09-25 NOTE — Assessment & Plan Note (Addendum)
 Pathology and radiology counseling: Discussed with the patient, the details of pathology including the type of breast cancer,the clinical staging, the significance of ER, PR and HER-2/neu receptors and the implications for treatment. After reviewing the pathology in detail, we proceeded to discuss the different treatment options between surgery, radiation, chemotherapy, antiestrogen therapies.  cT1c cN0 left invasive ductal carcinoma, Triple negative Genetic counseling pending.  Recommend neoadjuvant chemotherapy followed by surgery radiation, adjuvant treatment.  She is not interested in clinical trial.  Recommend neoadjuvant carboplatin  Taxol  Keytruda  followed by [facility] + Keytruda .   Labs are reviewed and discussed with patient. Proceed with cycle 1 D15  carboplatin  Taxol 

## 2024-09-25 NOTE — Patient Instructions (Signed)
 Device With a Small Disc Placed for Long-Term IV Use (Implanted Port): Care at Home An implanted port is a device with a small disc that's put under your skin. In most cases, it's placed in your chest. It can be used to draw blood and send medicines and fluids quickly into your bloodstream. You may need a port to give you: IV medicines that would bother the small veins in your hands or arms. IV medicines for a long time, such as medicines to kill cancer cells (chemotherapy). IV liquid nutrition for a long time. An implanted port has 2 main parts: The portal. This is the round disc where the needle is put in. It may look like a small, raised area under your skin. The catheter. This is a soft tube that connects the port to a vein. You may be able to do more everyday tasks with a port than with other types of long-term IVs. How is my port accessed?  A numbing cream may be put on the skin over the port site. Your skin will be cleaned with a solution that kills germs. Your health care provider will gently pinch the port and put a needle in it. The port will be checked to make sure it's in the vein and working. If you need to get medicine all the time, the needle may be left in the port. Your provider will put a clear bandage over the needle site. The bandage and needle will need to be changed each week. What is flushing? Flushing helps keep the port working like it should. Flush your port as told. If your port is only used from time to time to give medicines or draw blood, you may need to flush it: Before and after medicines have been given. Before and after blood has been drawn. Every 4-6 weeks to keep it working well. If you get medicine through your port all the time, you may not need to flush it. Talk with your provider to learn more. How long will my port stay in? The port will stay in for as long as it's needed. When it's time for it to come out, you'll have a procedure to remove it. Follow  these instructions at home: Caring for your port and port site Flush your port as told. If you need to get an infusion of medicine over a few days, take care of your port as told. Make sure you: Take care of your port site. Wash your hands with soap and water  for at least 20 seconds before and after you change your bandage. If you can't use soap and water , use hand sanitizer. Put any used bandages or infusion bags in a plastic bag. Throw the bag in the trash. Keep the bandage that covers the needle clean and dry. Do not let it get wet. Do not use scissors or sharp objects near the tubes. Keep any tubes clamped, unless they're being used. Check the area around your port site every day for signs of infection. Check for: Redness, swelling, or pain. Fluid or blood. Warmth. Pus or a bad smell. Protect the skin around the port site. Avoid wearing bra straps that rub or irritate the site. Protect the skin around your port from seat belts. Place a soft pad over your chest if needed. Do not take baths, swim, or use a hot tub until you're told it's OK. Ask if you can shower. General instructions  Throw away any syringes in a container that's meant for sharp  items (sharps container). You can buy a sharps container from a pharmacy. You can also make one by using an empty, hard plastic bottle with a lid. Always carry a medical alert card or wear a medical alert bracelet. Ask what things are safe for you to do at home. Ask when you can go back to work or school. Where to find more information To learn more, go to: American Cancer Society at prombar.it. Click on the magnifying glass and type IV lines and ports. Find the link you need. American Society of Clinical Oncology at fabvets.de. Click on the magnifying glass and type getting chemo infusions. Find the link you need. Contact a health care provider if: You can't flush your port. You can't draw blood from the port. You have a fever or  chills. You have any signs of infection. You have swelling in your arm, neck, or shoulder. This information is not intended to replace advice given to you by your health care provider. Make sure you discuss any questions you have with your health care provider. Document Revised: 05/22/2024 Document Reviewed: 05/22/2024 Elsevier Patient Education  2025 [employer].

## 2024-09-25 NOTE — Assessment & Plan Note (Signed)
Negative genetic testing

## 2024-09-25 NOTE — Progress Notes (Signed)
 Nutrition Assessment   Reason for Assessment:  [facility] referral   ASSESSMENT:  64 year old female with triple negative breast cancer and thyroid nodules.  Past medical history of HTN, etoh, pre DM.  Patient receiving neoadjuvant chemotherapy of carboplatin , taxol , keytruda .   Met with patient during infusion.  Reports good/normal appetite.  Typically has cereal or bacon eggs for breakfast.  Lunch is sandwich or salad.  Supper is spaghetti or something similar.  Reports some taste change.  Overall no other nutrition impact symptoms.     Medications: compazine , colace, zofran , dexamethasone    Labs: reviewed   Anthropometrics:   Height: 67 inches Weight: 222 lb  218-236 lb BMI: 34   Estimated Energy Needs  Kcals: 2200-2500 Protein: 100-120 g Fluid: > 2200 ml   NUTRITION DIAGNOSIS: none at this time     INTERVENTION:  Discussed Nutrition During Cancer Treatment. Handout given With good appetite, encouraged lean meats and plant foods Contact information provided   MONITORING, EVALUATION, GOAL: weight trends, intake   Next Visit: Wed, Mar 4 after injection  Katrinna Travieso B. Dasie SOLON, CSO, LDN Registered Dietitian [phone]

## 2024-09-25 NOTE — Assessment & Plan Note (Signed)
 Chemotherapy plan as listed above

## 2024-09-25 NOTE — Telephone Encounter (Addendum)
 [facility] faxed refill request for the following medications:  amLODipine  (NORVASC ) 10 MG tablet   losartan  (COZAAR ) 100 MG tablet    Please advise.

## 2024-09-25 NOTE — Patient Instructions (Signed)
 [facility] - A DEPT OF [facility]  Discharge Instructions: Thank you for choosing [facility] to provide your oncology and hematology care.  If you have a lab appointment with the Cancer Center, please go directly to the Cancer Center and check in at the registration area.  Wear comfortable clothing and clothing appropriate for easy access to any Portacath or PICC line.   We strive to give you quality time with your provider. You may need to reschedule your appointment if you arrive late (15 or more minutes).  Arriving late affects you and other patients whose appointments are after yours.  Also, if you miss three or more appointments without notifying the office, you may be dismissed from the clinic at the providers discretion.      For prescription refill requests, have your pharmacy contact our office and allow 72 hours for refills to be completed.    Today you received the following chemotherapy and/or immunotherapy agents Taxol , Carboplatin       To help prevent nausea and vomiting after your treatment, we encourage you to take your nausea medication as directed.  BELOW ARE SYMPTOMS THAT SHOULD BE REPORTED IMMEDIATELY: *FEVER GREATER THAN 100.4 F (38 C) OR HIGHER *CHILLS OR SWEATING *NAUSEA AND VOMITING THAT IS NOT CONTROLLED WITH YOUR NAUSEA MEDICATION *UNUSUAL SHORTNESS OF BREATH *UNUSUAL BRUISING OR BLEEDING *URINARY PROBLEMS (pain or burning when urinating, or frequent urination) *BOWEL PROBLEMS (unusual diarrhea, constipation, pain near the anus) TENDERNESS IN MOUTH AND THROAT WITH OR WITHOUT PRESENCE OF ULCERS (sore throat, sores in mouth, or a toothache) UNUSUAL RASH, SWELLING OR PAIN  UNUSUAL VAGINAL DISCHARGE OR ITCHING   Items with * indicate a potential emergency and should be followed up as soon as possible or go to the Emergency Department if any problems should occur.  Please show the CHEMOTHERAPY ALERT CARD or  IMMUNOTHERAPY ALERT CARD at check-in to the Emergency Department and triage nurse.  Should you have questions after your visit or need to cancel or reschedule your appointment, please contact [facility] - A DEPT OF [facility]  [phone] and follow the prompts.  Office hours are 8:00 a.m. to 4:30 p.m. Monday - Friday. Please note that voicemails left after 4:00 p.m. may not be returned until the following business day.  We are closed weekends and major holidays. You have access to a nurse at all times for urgent questions. Please call the main number to the clinic [phone] and follow the prompts.  For any non-urgent questions, you may also contact your provider using MyChart. We now offer e-Visits for anyone 68 and older to request care online for non-urgent symptoms. For details visit [email or web address].   Also download the MyChart app! Go to the app store, search MyChart, open the app, select [place], and log in with your MyChart username and password.  Paclitaxel  Injection What is this medication? PACLITAXEL  (PAK li TAX el) treats some types of cancer. It works by slowing down the growth of cancer cells. This medicine may be used for other purposes; ask your health care provider or pharmacist if you have questions. COMMON BRAND NAME(S): Onxol, Taxol  What should I tell my care team before I take this medication? They need to know if you have any of these conditions: Heart disease Liver disease Low white blood cell levels An unusual or allergic reaction to paclitaxel , other medications, foods, dyes, or preservatives If you  or your partner are pregnant or trying to get pregnant Breast-feeding How should I use this medication? This medication is infused into a vein. It is given by your care team in a hospital or clinic setting. Talk to your care team about the use of this medication in children. Special care may be needed. Overdosage: If you  think you have taken too much of this medicine contact a poison control center or emergency room at once. NOTE: This medicine is only for you. Do not share this medicine with others. What if I miss a dose? Keep appointments for follow-up doses. It is important not to miss your dose. Call your care team if you are unable to keep an appointment. What may interact with this medication? Do not take this medication with any of the following: Live virus vaccines Other medications may affect the way this medication works. Talk with your care team about all of the medications you take. They may suggest changes to your treatment plan to lower the risk of side effects and to make sure your medications work as intended. This list may not describe all possible interactions. Give your health care provider a list of all the medicines, herbs, non-prescription drugs, or dietary supplements you use. Also tell them if you smoke, drink alcohol, or use illegal drugs. Some items may interact with your medicine. What should I watch for while using this medication? Your condition will be monitored carefully while you are receiving this medication. You may need blood work while taking this medication. This medication may make you feel generally unwell. This is not uncommon as chemotherapy can affect healthy cells as well as cancer cells. Report any side effects. Continue your course of treatment even though you feel ill unless your care team tells you to stop. This medication can cause serious allergic reactions. To reduce the risk, your care team may give you other medications to take before receiving this one. Be sure to follow the directions from your care team. This medication may increase your risk of getting an infection. Call your care team for advice if you get a fever, chills, sore throat, or other symptoms of a cold or flu. Do not treat yourself. Try to avoid being around people who are sick. This medication may  increase your risk to bruise or bleed. Call your care team if you notice any unusual bleeding. Be careful brushing or flossing your teeth or using a toothpick because you may get an infection or bleed more easily. If you have any dental work done, tell your dentist you are receiving this medication. Talk to your care team if you may be pregnant. Serious birth defects can occur if you take this medication during pregnancy. Talk to your care team before breastfeeding. Changes to your treatment plan may be needed. What side effects may I notice from receiving this medication? Side effects that you should report to your care team as soon as possible: Allergic reactions--skin rash, itching, hives, swelling of the face, lips, tongue, or throat Heart rhythm changes--fast or irregular heartbeat, dizziness, feeling faint or lightheaded, chest pain, trouble breathing Increase in blood pressure Infection--fever, chills, cough, sore throat, wounds that don't heal, pain or trouble when passing urine, general feeling of discomfort or being unwell Low blood pressure--dizziness, feeling faint or lightheaded, blurry vision Low red blood cell level--unusual weakness or fatigue, dizziness, headache, trouble breathing Painful swelling, warmth, or redness of the skin, blisters or sores at the infusion site Pain, tingling, or numbness  in the hands or feet Slow heartbeat--dizziness, feeling faint or lightheaded, confusion, trouble breathing, unusual weakness or fatigue Unusual bruising or bleeding Side effects that usually do not require medical attention (report to your care team if they continue or are bothersome): Diarrhea Hair loss Joint pain Loss of appetite Muscle pain Nausea Vomiting This list may not describe all possible side effects. Call your doctor for medical advice about side effects. You may report side effects to FDA at 1-800-FDA-1088. Where should I keep my medication? This medication is given in  a hospital or clinic. It will not be stored at home. NOTE: This sheet is a summary. It may not cover all possible information. If you have questions about this medicine, talk to your doctor, pharmacist, or health care provider.  2025 Elsevier/Gold Standard (2024-06-14 00:00:00)  Carboplatin  Injection What is this medication? CARBOPLATIN  (KAR boe pla tin) treats some types of cancer. It works by slowing down the growth of cancer cells. This medicine may be used for other purposes; ask your health care provider or pharmacist if you have questions. COMMON BRAND NAME(S): Paraplatin  What should I tell my care team before I take this medication? They need to know if you have any of these conditions: Blood disorders Hearing problems Kidney disease Recent or ongoing radiation therapy An unusual or allergic reaction to carboplatin , cisplatin, other medications, foods, dyes, or preservatives Pregnant or trying to get pregnant Breast-feeding How should I use this medication? This medication is injected into a vein. It is given by your care team in a hospital or clinic setting. Talk to your care team about the use of this medication in children. Special care may be needed. Overdosage: If you think you have taken too much of this medicine contact a poison control center or emergency room at once. NOTE: This medicine is only for you. Do not share this medicine with others. What if I miss a dose? Keep appointments for follow-up doses. It is important not to miss your dose. Call your care team if you are unable to keep an appointment. What may interact with this medication? Medications for seizures Some antibiotics, such as amikacin, gentamicin, neomycin, streptomycin, tobramycin Vaccines This list may not describe all possible interactions. Give your health care provider a list of all the medicines, herbs, non-prescription drugs, or dietary supplements you use. Also tell them if you smoke, drink  alcohol, or use illegal drugs. Some items may interact with your medicine. What should I watch for while using this medication? Your condition will be monitored carefully while you are receiving this medication. You may need blood work while taking this medication. This medication may make you feel generally unwell. This is not uncommon, as chemotherapy can affect healthy cells as well as cancer cells. Report any side effects. Continue your course of treatment even though you feel ill unless your care team tells you to stop. In some cases, you may be given additional medications to help with side effects. Follow all directions for their use. This medication may increase your risk of getting an infection. Call your care team for advice if you get a fever, chills, sore throat, or other symptoms of a cold or flu. Do not treat yourself. Try to avoid being around people who are sick. Avoid taking medications that contain aspirin , acetaminophen , ibuprofen, naproxen, or ketoprofen unless instructed by your care team. These medications may hide a fever. Be careful brushing or flossing your teeth or using a toothpick because you may get an  infection or bleed more easily. If you have any dental work done, tell your dentist you are receiving this medication. Talk to your care team if you wish to become pregnant or think you might be pregnant. This medication can cause serious birth defects. Talk to your care team about effective forms of contraception. Do not breast-feed while taking this medication. What side effects may I notice from receiving this medication? Side effects that you should report to your care team as soon as possible: Allergic reactions--skin rash, itching, hives, swelling of the face, lips, tongue, or throat Infection--fever, chills, cough, sore throat, wounds that don't heal, pain or trouble when passing urine, general feeling of discomfort or being unwell Low red blood cell level--unusual  weakness or fatigue, dizziness, headache, trouble breathing Pain, tingling, or numbness in the hands or feet, muscle weakness, change in vision, confusion or trouble speaking, loss of balance or coordination, trouble walking, seizures Unusual bruising or bleeding Side effects that usually do not require medical attention (report to your care team if they continue or are bothersome): Hair loss Nausea Unusual weakness or fatigue Vomiting This list may not describe all possible side effects. Call your doctor for medical advice about side effects. You may report side effects to FDA at 1-800-FDA-1088. Where should I keep my medication? This medication is given in a hospital or clinic. It will not be stored at home. NOTE: This sheet is a summary. It may not cover all possible information. If you have questions about this medicine, talk to your doctor, pharmacist, or health care provider.  2024 Elsevier/Gold Standard (2021-12-01 00:00:00)

## 2024-09-25 NOTE — Telephone Encounter (Signed)
[facility] faxed refill request for the following medications:  hydrochlorothiazide (HYDRODIURIL) 25 MG tablet   Please advise.  

## 2024-09-25 NOTE — Assessment & Plan Note (Signed)
"   thyroid ultrasound reviewed. 2 nodules need surveillance, + Goiter.  Refer to endocrinology.  "

## 2024-09-25 NOTE — Assessment & Plan Note (Signed)
 Recent CT hematuria work up did not show metastatic bladder cancer seen within the abdomen or pelvis

## 2024-09-26 ENCOUNTER — Inpatient Hospital Stay

## 2024-09-26 DIAGNOSIS — C50912 Malignant neoplasm of unspecified site of left female breast: Secondary | ICD-10-CM

## 2024-09-26 MED ORDER — FILGRASTIM-SNDZ 480 MCG/0.8ML IJ SOSY
480.0000 ug | PREFILLED_SYRINGE | Freq: Once | INTRAMUSCULAR | Status: AC
  Administered 2024-09-26: 480 ug via SUBCUTANEOUS
  Filled 2024-09-26: qty 0.8

## 2024-09-27 ENCOUNTER — Inpatient Hospital Stay

## 2024-09-27 DIAGNOSIS — C50912 Malignant neoplasm of unspecified site of left female breast: Secondary | ICD-10-CM

## 2024-09-27 MED ORDER — FILGRASTIM-SNDZ 480 MCG/0.8ML IJ SOSY
480.0000 ug | PREFILLED_SYRINGE | Freq: Once | INTRAMUSCULAR | Status: AC
  Administered 2024-09-27: 480 ug via SUBCUTANEOUS
  Filled 2024-09-27: qty 0.8

## 2024-09-28 ENCOUNTER — Inpatient Hospital Stay

## 2024-09-28 DIAGNOSIS — C50912 Malignant neoplasm of unspecified site of left female breast: Secondary | ICD-10-CM

## 2024-09-28 MED ORDER — FILGRASTIM-SNDZ 480 MCG/0.8ML IJ SOSY
480.0000 ug | PREFILLED_SYRINGE | Freq: Once | INTRAMUSCULAR | Status: AC
  Administered 2024-09-28: 480 ug via SUBCUTANEOUS
  Filled 2024-09-28: qty 0.8

## 2024-10-08 ENCOUNTER — Inpatient Hospital Stay

## 2024-10-08 ENCOUNTER — Inpatient Hospital Stay: Admitting: Oncology

## 2024-10-15 ENCOUNTER — Inpatient Hospital Stay

## 2024-10-22 ENCOUNTER — Inpatient Hospital Stay

## 2024-10-22 ENCOUNTER — Inpatient Hospital Stay: Admitting: Oncology

## 2024-10-23 ENCOUNTER — Inpatient Hospital Stay

## 2024-10-24 ENCOUNTER — Inpatient Hospital Stay

## 2024-10-25 ENCOUNTER — Inpatient Hospital Stay

## 2025-07-24 ENCOUNTER — Other Ambulatory Visit: Admitting: Urology
